# Patient Record
Sex: Male | Born: 1975 | Race: White | Hispanic: No | Marital: Married | Smoking: Current every day smoker
Health system: Southern US, Community
[De-identification: ages and names within clinical notes are randomized; demographics above are authoritative.]

## PROBLEM LIST (undated history)

## (undated) DIAGNOSIS — T8859XA Other complications of anesthesia, initial encounter: Secondary | ICD-10-CM

## (undated) DIAGNOSIS — Z720 Tobacco use: Secondary | ICD-10-CM

## (undated) DIAGNOSIS — T4145XA Adverse effect of unspecified anesthetic, initial encounter: Secondary | ICD-10-CM

## (undated) DIAGNOSIS — K219 Gastro-esophageal reflux disease without esophagitis: Secondary | ICD-10-CM

## (undated) DIAGNOSIS — E119 Type 2 diabetes mellitus without complications: Secondary | ICD-10-CM

## (undated) DIAGNOSIS — K635 Polyp of colon: Secondary | ICD-10-CM

## (undated) DIAGNOSIS — K2270 Barrett's esophagus without dysplasia: Secondary | ICD-10-CM

## (undated) DIAGNOSIS — Z8249 Family history of ischemic heart disease and other diseases of the circulatory system: Secondary | ICD-10-CM

## (undated) DIAGNOSIS — I10 Essential (primary) hypertension: Secondary | ICD-10-CM

## (undated) HISTORY — PX: HERNIA REPAIR: SHX51

## (undated) HISTORY — PX: MULTIPLE TOOTH EXTRACTIONS: SHX2053

## (undated) HISTORY — PX: OTHER SURGICAL HISTORY: SHX169

## (undated) HISTORY — DX: Barrett's esophagus without dysplasia: K22.70

## (undated) HISTORY — DX: Polyp of colon: K63.5

---

## 2003-01-28 ENCOUNTER — Emergency Department (HOSPITAL_COMMUNITY): Admission: EM | Admit: 2003-01-28 | Discharge: 2003-01-28 | Payer: Self-pay | Admitting: Emergency Medicine

## 2007-01-22 ENCOUNTER — Emergency Department (HOSPITAL_COMMUNITY): Admission: EM | Admit: 2007-01-22 | Discharge: 2007-01-22 | Payer: Self-pay | Admitting: Emergency Medicine

## 2007-04-12 ENCOUNTER — Emergency Department (HOSPITAL_COMMUNITY): Admission: EM | Admit: 2007-04-12 | Discharge: 2007-04-12 | Payer: Self-pay | Admitting: Emergency Medicine

## 2008-08-06 ENCOUNTER — Emergency Department (HOSPITAL_COMMUNITY): Admission: EM | Admit: 2008-08-06 | Discharge: 2008-08-06 | Payer: Self-pay | Admitting: Emergency Medicine

## 2008-10-24 ENCOUNTER — Emergency Department (HOSPITAL_COMMUNITY): Admission: EM | Admit: 2008-10-24 | Discharge: 2008-10-24 | Payer: Self-pay | Admitting: Emergency Medicine

## 2010-04-09 ENCOUNTER — Emergency Department (HOSPITAL_COMMUNITY): Admission: EM | Admit: 2010-04-09 | Discharge: 2010-04-10 | Payer: Self-pay | Admitting: Emergency Medicine

## 2010-10-18 LAB — URINE MICROSCOPIC-ADD ON

## 2010-10-18 LAB — URINALYSIS, ROUTINE W REFLEX MICROSCOPIC
Nitrite: NEGATIVE
Specific Gravity, Urine: 1.025 (ref 1.005–1.030)
Urobilinogen, UA: 0.2 mg/dL (ref 0.0–1.0)
pH: 6 (ref 5.0–8.0)

## 2011-05-22 LAB — WOUND CULTURE

## 2011-08-03 ENCOUNTER — Emergency Department (HOSPITAL_COMMUNITY)
Admission: EM | Admit: 2011-08-03 | Discharge: 2011-08-03 | Disposition: A | Discharge status: Home / Self Care | Payer: Self-pay | Attending: Emergency Medicine | Admitting: Emergency Medicine

## 2011-08-03 DIAGNOSIS — K089 Disorder of teeth and supporting structures, unspecified: Secondary | ICD-10-CM | POA: Insufficient documentation

## 2011-08-03 DIAGNOSIS — K0889 Other specified disorders of teeth and supporting structures: Secondary | ICD-10-CM

## 2011-08-03 DIAGNOSIS — F17200 Nicotine dependence, unspecified, uncomplicated: Secondary | ICD-10-CM | POA: Insufficient documentation

## 2011-08-03 DIAGNOSIS — K029 Dental caries, unspecified: Secondary | ICD-10-CM | POA: Insufficient documentation

## 2011-08-03 MED ORDER — PENICILLIN V POTASSIUM 250 MG PO TABS
500.0000 mg | ORAL_TABLET | Freq: Once | ORAL | Status: AC
  Administered 2011-08-03: 500 mg via ORAL
  Filled 2011-08-03: qty 2

## 2011-08-03 MED ORDER — IBUPROFEN 800 MG PO TABS
800.0000 mg | ORAL_TABLET | Freq: Once | ORAL | Status: AC
  Administered 2011-08-03: 800 mg via ORAL
  Filled 2011-08-03: qty 1

## 2011-08-03 MED ORDER — HYDROCODONE-ACETAMINOPHEN 5-325 MG PO TABS: 1.0000 | ORAL_TABLET | Freq: Four times a day (QID) | ORAL | Status: AC | PRN

## 2011-08-03 MED ORDER — PENICILLIN V POTASSIUM 500 MG PO TABS: 500.0000 mg | ORAL_TABLET | Freq: Four times a day (QID) | ORAL | Status: AC

## 2011-08-03 NOTE — ED Provider Notes (Signed)
Medical screening examination/treatment/procedure(s) were performed by non-physician practitioner and as supervising physician I was immediately available for consultation/collaboration.  Leyla Soliz, MD 08/03/11 1954 

## 2011-08-03 NOTE — ED Notes (Signed)
Pt presents with dental pain to right upper tooth. Pt states there is a blister present on the roof of mouth next to painful tooth. Pt states pain started 3 days ago.

## 2011-08-03 NOTE — ED Provider Notes (Signed)
History     CSN: [id number]  Arrival date & time 08/03/11  1208   None     Chief Complaint  Patient presents with  . Dental Pain    (Consider location/radiation/quality/duration/timing/severity/associated sxs/prior treatment) Patient is a 35 y.o. male presenting with tooth pain. The history is provided by the patient. No language interpreter was used.  Dental PainThe primary symptoms include mouth pain. Primary symptoms do not include dental injury. The symptoms began 2 days ago. The symptoms are unchanged. The symptoms occur constantly.    History reviewed. No pertinent past medical history.  Past Surgical History  Procedure Date  . Hernia repair     Family History  Problem Relation Age of Onset  . Diabetes Mother   . Hypertension Mother   . Heart failure Father   . Diabetes Brother   . Hypertension Brother     History  Substance Use Topics  . Smoking status: Current Everyday Smoker -- 2.0 packs/day  . Smokeless tobacco: Not on file  . Alcohol Use: No      Review of Systems  HENT: Positive for dental problem.   All other systems reviewed and are negative.    Allergies  Review of patient's allergies indicates no known allergies.  Home Medications  No current outpatient prescriptions on file.  BP 161/105  Pulse 132  Temp(Src) 98.3 F (36.8 C) (Oral)  Resp 18  Ht 5\' 9"  (1.753 m)  Wt 255 lb (115.667 kg)  BMI 37.66 kg/m2  SpO2 100%  Physical Exam  Nursing note and vitals reviewed. Constitutional: [name] oriented to person, place, and time. He appears well-developed and well-nourished.  HENT:  Head: Normocephalic and atraumatic.  Nose: Nose normal.  Mouth/Throat: Oropharynx is clear and moist and mucous membranes are normal. Abnormal dentition. Dental caries present. No dental abscesses or uvula swelling. No oropharyngeal exudate.    Eyes: EOM are normal.  Neck: Normal range of motion.  Cardiovascular: Normal rate, regular rhythm, normal heart  sounds and intact distal pulses.   Pulmonary/Chest: Effort normal and breath sounds normal. No respiratory distress.  Abdominal: Soft. He exhibits no distension. There is no tenderness.  Musculoskeletal: Normal range of motion.  Neurological: [name] alert and oriented to person, place, and time.  Skin: Skin is warm and dry.  Psychiatric: He has a normal mood and affect. Judgment normal.    ED Course  Procedures (including critical care time)  Labs Reviewed - No data to display No results found.   No diagnosis found.    MDM          Worthy Rancher, PA 08/03/11 [phone]

## 2011-12-18 ENCOUNTER — Emergency Department (HOSPITAL_COMMUNITY)
Admission: EM | Admit: 2011-12-18 | Discharge: 2011-12-18 | Disposition: A | Discharge status: Home / Self Care | Payer: Medicaid Other | Attending: Emergency Medicine | Admitting: Emergency Medicine

## 2011-12-18 ENCOUNTER — Encounter (HOSPITAL_COMMUNITY): Payer: Self-pay | Admitting: *Deleted

## 2011-12-18 ENCOUNTER — Emergency Department (HOSPITAL_COMMUNITY): Payer: Medicaid Other

## 2011-12-18 DIAGNOSIS — L02619 Cutaneous abscess of unspecified foot: Secondary | ICD-10-CM | POA: Insufficient documentation

## 2011-12-18 DIAGNOSIS — M7989 Other specified soft tissue disorders: Secondary | ICD-10-CM | POA: Insufficient documentation

## 2011-12-18 DIAGNOSIS — L03116 Cellulitis of left lower limb: Secondary | ICD-10-CM

## 2011-12-18 MED ORDER — HYDROCODONE-ACETAMINOPHEN 5-325 MG PO TABS: ORAL_TABLET | ORAL | Status: DC

## 2011-12-18 MED ORDER — DOXYCYCLINE HYCLATE 100 MG PO CAPS: 100.0000 mg | ORAL_CAPSULE | Freq: Two times a day (BID) | ORAL | Status: AC

## 2011-12-18 MED ORDER — LIDOCAINE HCL (PF) 1 % IJ SOLN
INTRAMUSCULAR | Status: AC
  Administered 2011-12-18: 2.1 mL
  Filled 2011-12-18: qty 5

## 2011-12-18 MED ORDER — CEFTRIAXONE SODIUM 1 G IJ SOLR
1.0000 g | Freq: Once | INTRAMUSCULAR | Status: AC
  Administered 2011-12-18: 1 g via INTRAMUSCULAR
  Filled 2011-12-18: qty 10

## 2011-12-18 MED ORDER — DOXYCYCLINE HYCLATE 100 MG PO TABS
100.0000 mg | ORAL_TABLET | Freq: Once | ORAL | Status: AC
  Administered 2011-12-18: 100 mg via ORAL
  Filled 2011-12-18: qty 1

## 2011-12-18 NOTE — ED Notes (Signed)
Pt c/o pain and swelling to his left foot x 2 days. Denies injury.

## 2011-12-18 NOTE — ED Provider Notes (Signed)
Medical screening examination/treatment/procedure(s) were performed by non-physician practitioner and as supervising physician I was immediately available for consultation/collaboration.   Dione Booze, MD 12/18/11 [phone]

## 2011-12-18 NOTE — ED Provider Notes (Signed)
History     CSN: [id number]  Arrival date & time 12/18/11  1715   First MD Initiated Contact with Patient 12/18/11 1806      Chief Complaint  Patient presents with  . Foot Pain    (Consider location/radiation/quality/duration/timing/severity/associated sxs/prior treatment) HPI Comments: Pt has noted gradually worsening swelling and pain to dorsum of L foot since yest.  No known injury.  No fever or chills.  No h/o DM.  Patient is a 36 y.o. male presenting with lower extremity pain. The history is provided by the patient. No language interpreter was used.  Foot Pain This is a new problem. The current episode started yesterday. The problem occurs constantly. The problem has been gradually worsening. Pertinent negatives include no fever. The symptoms are aggravated by walking and standing (palpation). He has tried nothing for the symptoms.    History reviewed. No pertinent past medical history.  Past Surgical History  Procedure Date  . Hernia repair     Family History  Problem Relation Age of Onset  . Diabetes Mother   . Hypertension Mother   . Heart failure Father   . Diabetes Brother   . Hypertension Brother     History  Substance Use Topics  . Smoking status: Current Everyday Smoker -- 2.0 packs/day    Types: Cigarettes  . Smokeless tobacco: Not on file  . Alcohol Use: No      Review of Systems  Constitutional: Negative for fever.  Musculoskeletal:       Foot pain  All other systems reviewed and are negative.    Allergies  Review of patient's allergies indicates no known allergies.  Home Medications   Current Outpatient Rx  Name Route Sig Dispense Refill  . ACETAMINOPHEN 500 MG PO TABS Oral Take 1,500-2,000 mg by mouth 2 (two) times daily as needed. For pain    . DOXYCYCLINE HYCLATE 100 MG PO CAPS Oral Take 1 capsule (100 mg total) by mouth 2 (two) times daily. 20 capsule 0    BP 149/99  Pulse 112  Temp(Src) 97.8 F (36.6 C) (Oral)  Resp 20  Ht  5\' 9"  (1.753 m)  Wt 273 lb 6.4 oz (124.013 kg)  BMI 40.37 kg/m2  SpO2 97%  Physical Exam  Nursing note and vitals reviewed. Constitutional: [name] oriented to person, place, and time. He appears well-developed and well-nourished.  HENT:  Head: Normocephalic and atraumatic.  Eyes: EOM are normal.  Neck: Normal range of motion.  Cardiovascular: Normal rate, regular rhythm, normal heart sounds and intact distal pulses.   Pulmonary/Chest: Effort normal and breath sounds normal. No respiratory distress.  Abdominal: Soft. He exhibits no distension. There is no tenderness.  Musculoskeletal: He exhibits tenderness.       Left foot: He exhibits decreased range of motion, tenderness and swelling. He exhibits no bony tenderness, normal capillary refill, no crepitus, no deformity and no laceration.       Feet:       + erythema, swelling and PT especially at junction of second and third toes.  No focal induration or fluctuance.  Severe athletes foot with break down of skin between toes.  prob secondary infection with cellulitis.  Neurological: [name] alert and oriented to person, place, and time.  Skin: Skin is warm and dry.  Psychiatric: He has a normal mood and affect. Judgment normal.    ED Course  Procedures (including critical care time)  Labs Reviewed - No data to display Dg Foot Complete Left  12/18/2011  *RADIOLOGY REPORT*  Clinical Data: Left foot pain with swelling and redness.  LEFT FOOT - COMPLETE 3+ VIEW  Comparison: None.  Findings: There is soft tissue swelling about the forefoot.  No underlying acute osseous abnormality.  Calcaneal spurs are noted.  IMPRESSION: Forefoot soft tissue swelling without acute osseous abnormality.  Original Report Authenticated By: Reyes Ivan, M.D.     1. Cellulitis of left foot       MDM  Suspect cellulitis.  Secondary infection with underlying athletes foot.  Told to return tomorrow if no noted improvement for ? IV abx.  Have given  rocephin 1 gm IM and started on doxycycline 100 mg BID.  Has no PCP.        Worthy Rancher, PA 12/18/11 1949

## 2011-12-18 NOTE — Discharge Instructions (Signed)
Cellulitis Cellulitis is an infection of the tissue under the skin. The infected area is usually red and tender. This is caused by germs. These germs enter the body through cuts or sores. This usually happens in the arms or lower legs. HOME CARE   Take your medicine as told. Finish it even if you start to feel better.   If the infection is on the arm or leg, keep it raised (elevated).   Use a warm cloth on the infected area several times a day.   See your doctor for a follow-up visit as told.  GET HELP RIGHT AWAY IF:   You are tired or confused.   You throw up (vomit).   You have watery poop (diarrhea).   You feel ill and have muscle aches.   You have a fever.  MAKE SURE YOU:   Understand these instructions.   Will watch your condition.   Will get help right away if you are not doing well or get worse.  Document Released: 01/08/2008 Document Revised: 07/11/2011 Document Reviewed: 06/23/2009 [employer] Patient Information 2012 [employer]. 

## 2011-12-24 ENCOUNTER — Emergency Department (HOSPITAL_COMMUNITY): Payer: Medicaid Other

## 2011-12-24 ENCOUNTER — Encounter (HOSPITAL_COMMUNITY): Payer: Self-pay

## 2011-12-24 ENCOUNTER — Inpatient Hospital Stay (HOSPITAL_COMMUNITY)
Admission: EM | Admit: 2011-12-24 | Discharge: 2011-12-26 | DRG: 603 | Disposition: A | Discharge status: Home / Self Care | Payer: Medicaid Other | Attending: Internal Medicine | Admitting: Internal Medicine

## 2011-12-24 DIAGNOSIS — Z720 Tobacco use: Secondary | ICD-10-CM

## 2011-12-24 DIAGNOSIS — L03119 Cellulitis of unspecified part of limb: Secondary | ICD-10-CM

## 2011-12-24 DIAGNOSIS — S91302A Unspecified open wound, left foot, initial encounter: Secondary | ICD-10-CM

## 2011-12-24 DIAGNOSIS — B353 Tinea pedis: Secondary | ICD-10-CM

## 2011-12-24 DIAGNOSIS — Z8249 Family history of ischemic heart disease and other diseases of the circulatory system: Secondary | ICD-10-CM

## 2011-12-24 DIAGNOSIS — L03116 Cellulitis of left lower limb: Secondary | ICD-10-CM

## 2011-12-24 DIAGNOSIS — F17200 Nicotine dependence, unspecified, uncomplicated: Secondary | ICD-10-CM | POA: Diagnosis present

## 2011-12-24 DIAGNOSIS — L02619 Cutaneous abscess of unspecified foot: Principal | ICD-10-CM | POA: Diagnosis present

## 2011-12-24 HISTORY — DX: Family history of ischemic heart disease and other diseases of the circulatory system: Z82.49

## 2011-12-24 HISTORY — DX: Tobacco use: Z72.0

## 2011-12-24 LAB — BASIC METABOLIC PANEL
BUN: 17 mg/dL (ref 6–23)
CO2: 26 mEq/L (ref 19–32)
Chloride: 99 mEq/L (ref 96–112)
GFR calc non Af Amer: >90 mL/min (ref >90)
Glucose, Bld: 112 mg/dL — ABNORMAL HIGH (ref 70–99)
Potassium: 4.7 mEq/L (ref 3.5–5.1)
Sodium: 135 mEq/L (ref 135–145)

## 2011-12-24 LAB — CBC
Hemoglobin: 17 g/dL (ref 13.0–17.0)
MCH: 30 pg (ref 26.0–34.0)
Platelets: 263 10*3/uL (ref 150–400)
RBC: 5.67 MIL/uL (ref 4.22–5.81)
WBC: 11.9 10*3/uL — ABNORMAL HIGH (ref 4.0–10.5)

## 2011-12-24 LAB — DIFFERENTIAL
Eosinophils Absolute: 0.2 10*3/uL (ref 0.0–0.7)
Lymphocytes Relative: 21 % (ref 12–46)
Lymphs Abs: 2.5 10*3/uL (ref 0.7–4.0)
Monocytes Relative: 7 % (ref 3–12)
Neutro Abs: 8.5 10*3/uL — ABNORMAL HIGH (ref 1.7–7.7)
Neutrophils Relative %: 71 % (ref 43–77)

## 2011-12-24 MED ORDER — VANCOMYCIN HCL 1000 MG IV SOLR
1250.0000 mg | Freq: Two times a day (BID) | INTRAVENOUS | Status: DC
  Administered 2011-12-24 – 2011-12-26 (×4): 1250 mg via INTRAVENOUS
  Filled 2011-12-24 (×4): qty 1250

## 2011-12-24 MED ORDER — ALUM & MAG HYDROXIDE-SIMETH 200-200-20 MG/5ML PO SUSP: 30.0000 mL | Freq: Four times a day (QID) | ORAL | Status: DC | PRN

## 2011-12-24 MED ORDER — SODIUM CHLORIDE 0.9 % IJ SOLN: 3.0000 mL | INTRAMUSCULAR | Status: DC | PRN

## 2011-12-24 MED ORDER — ACETAMINOPHEN 325 MG PO TABS: 650.0000 mg | ORAL_TABLET | Freq: Four times a day (QID) | ORAL | Status: DC | PRN

## 2011-12-24 MED ORDER — ONDANSETRON HCL 4 MG/2ML IJ SOLN: 4.0000 mg | Freq: Four times a day (QID) | INTRAMUSCULAR | Status: DC | PRN

## 2011-12-24 MED ORDER — ACETAMINOPHEN 650 MG RE SUPP: 650.0000 mg | Freq: Four times a day (QID) | RECTAL | Status: DC | PRN

## 2011-12-24 MED ORDER — SODIUM CHLORIDE 0.9 % IJ SOLN
INTRAMUSCULAR | Status: AC
  Filled 2011-12-24: qty 3

## 2011-12-24 MED ORDER — TERBINAFINE HCL 1 % EX CREA
1.0000 "application " | TOPICAL_CREAM | Freq: Two times a day (BID) | CUTANEOUS | Status: DC
  Administered 2011-12-24 – 2011-12-26 (×4): 1 via TOPICAL
  Filled 2011-12-24: qty 12

## 2011-12-24 MED ORDER — SODIUM CHLORIDE 0.9 % IV SOLN
Freq: Once | INTRAVENOUS | Status: AC
  Administered 2011-12-24: 1000 mL via INTRAVENOUS

## 2011-12-24 MED ORDER — SODIUM CHLORIDE 0.9 % IJ SOLN
3.0000 mL | Freq: Two times a day (BID) | INTRAMUSCULAR | Status: DC
  Administered 2011-12-24 – 2011-12-26 (×5): 3 mL via INTRAVENOUS
  Filled 2011-12-24 (×5): qty 3

## 2011-12-24 MED ORDER — ONDANSETRON HCL 4 MG PO TABS: 4.0000 mg | ORAL_TABLET | Freq: Four times a day (QID) | ORAL | Status: DC | PRN

## 2011-12-24 MED ORDER — VANCOMYCIN HCL IN DEXTROSE 1-5 GM/200ML-% IV SOLN
1000.0000 mg | Freq: Once | INTRAVENOUS | Status: AC
  Administered 2011-12-24: 1000 mg via INTRAVENOUS
  Filled 2011-12-24: qty 200

## 2011-12-24 MED ORDER — ONDANSETRON HCL 4 MG/2ML IJ SOLN
4.0000 mg | Freq: Once | INTRAMUSCULAR | Status: AC
  Administered 2011-12-24: 4 mg via INTRAVENOUS
  Filled 2011-12-24: qty 2

## 2011-12-24 MED ORDER — ALBUTEROL SULFATE HFA 108 (90 BASE) MCG/ACT IN AERS
2.0000 | INHALATION_SPRAY | Freq: Two times a day (BID) | RESPIRATORY_TRACT | Status: DC
  Administered 2011-12-24 – 2011-12-26 (×4): 2 via RESPIRATORY_TRACT
  Filled 2011-12-24: qty 6.7

## 2011-12-24 MED ORDER — DOXYCYCLINE HYCLATE 100 MG PO TABS
100.0000 mg | ORAL_TABLET | Freq: Two times a day (BID) | ORAL | Status: DC
  Administered 2011-12-24 – 2011-12-25 (×3): 100 mg via ORAL
  Filled 2011-12-24 (×3): qty 1

## 2011-12-24 MED ORDER — NICOTINE 21 MG/24HR TD PT24
21.0000 mg | MEDICATED_PATCH | Freq: Every day | TRANSDERMAL | Status: DC
  Administered 2011-12-24 – 2011-12-26 (×3): 21 mg via TRANSDERMAL
  Filled 2011-12-24 (×4): qty 1

## 2011-12-24 MED ORDER — HYDROCODONE-ACETAMINOPHEN 5-325 MG PO TABS
1.0000 | ORAL_TABLET | ORAL | Status: DC | PRN
  Administered 2011-12-24 – 2011-12-26 (×10): 1 via ORAL
  Filled 2011-12-24 (×10): qty 1

## 2011-12-24 MED ORDER — SODIUM CHLORIDE 0.9 % IV SOLN: 250.0000 mL | INTRAVENOUS | Status: DC | PRN

## 2011-12-24 MED ORDER — HYDROMORPHONE HCL PF 1 MG/ML IJ SOLN
1.0000 mg | Freq: Once | INTRAMUSCULAR | Status: AC
  Administered 2011-12-24: 1 mg via INTRAVENOUS
  Filled 2011-12-24: qty 1

## 2011-12-24 MED ORDER — HYDROMORPHONE HCL PF 1 MG/ML IJ SOLN: 0.5000 mg | INTRAMUSCULAR | Status: DC | PRN

## 2011-12-24 NOTE — ED Notes (Signed)
Left foot swollen and red for 1 week, was seen in er 2 days ago and told to return if no better, area more swollen and now draining

## 2011-12-24 NOTE — H&P (Signed)
Douglas Dorsey MRN: [id number] DOB/AGE: 10-Apr-1976 36 y.o. Primary Care Physician:No primary provider on file. Admit date: 12/24/2011 Chief Complaint: Left foot pain, swelling, and redness. HPI: The patient is a 36 year old man with no significant past medical history, who presents to the emergency department today for a recheck of his left foot. The patient originally presented to the emergency department on 12/18/2011 for pain, redness, and swelling of his left foot. The pain and redness started spontaneously. It became swollen. He denies trauma. He denies recent new shoes. He does wear socks with his shoes. He acknowledges that he has athletes feet. He was given Rocephin and discharged home on doxycycline from the emergency department on 12/18/2011.Marland Kitchen He says that he has been taking doxycycline as prescribed. However, the redness, swelling, and pain have not subsided over the past several days following the initiation of doxycycline. There has been no red streakiness radiating to his left leg. He has some calf pain. He now has some drainage from the toes on his left foot, but it is not a pustular drainage. He denies fever, chills, nausea, vomiting, or diarrhea. He denies any history of diabetes, but his mother has diabetes.  In the emergency department, the patient is noted to be afebrile and hemodynamically stable. His white blood cell count is marginally elevated at 11.9. The x-ray of his left  forefoot reveals soft tissue swelling, but no definite acute bony abnormalities. [name] being admitted for further evaluation and management.  Past Medical History  Diagnosis Date  . Tobacco abuse   . Family history of coronary artery disease 12/24/2011    Past Surgical History  Procedure Date  . Hernia repair     Umbilical    Prior to Admission medications   Medication Sig Start Date End Date Taking? Authorizing Provider  doxycycline (VIBRAMYCIN) 100 MG capsule Take 1 capsule (100 mg total) by mouth 2  (two) times daily. 12/18/11 12/28/11 Yes Richard Nicholi Half, PA  HYDROcodone-acetaminophen [facility]) 5-325 MG per tablet One tab po q 4-6 hrs prn pain 12/18/11  Yes Worthy Rancher, PA    Allergies: No Known Allergies  Family History  Problem Relation Age of Onset  . Diabetes Mother   . Hypertension Mother   . Heart failure Father   . Diabetes Brother   . Hypertension Brother    Family history continued: His mother has diabetes mellitus and status post CABG. His father died of a myocardial infarction.  Social History: The patient is married. He lives with his wife and children in [place]. He was laid off from his job. His unemployment benefits just ran out. His wife is unemployed. He smokes 2 packs of cigarettes daily. He denies alcohol and illicit drug use.           ROS: As above in history present illness, otherwise negative.  PHYSICAL EXAM: Blood pressure 129/86, pulse 95, temperature 98 F (36.7 C), temperature source Oral, resp. rate 22, height 5\' 9"  (1.753 m), weight 123.832 kg (273 lb), SpO2 95.00%.  General: 36 year old Caucasian man, sitting up in bed, in no acute distress. HEENT: Head is normocephalic, nontraumatic. Pupils are equal, round, and reactive to light. Extraocular movements are intact. Conjunctivae are clear. Oropharynx reveals moist mucous membranes. No posterior exudates or erythema. Neck: Supple, no adenopathy, no thyromegaly. Lungs: Occasional rhonchus wheezes. Breathing nonlabored. Heart: S1, S2, with no murmurs rubs or gallops. Abdomen: Obese, positive bowel sounds, soft, nontender, nondistended. Extremities: The left foot is edematous, nonpitting 1+ edema. The  dorsum of his left foot is moderately erythematous from his toes to just above his ankle. There is superficial ulcerations/excoriations over several toes of his left foot, with mild serosanguineous drainage, non-pustular/non-purulent. There is interdigital scaliness and erythema. There is scaliness  on the plantar surfaces of both feet. No calf tenderness. No popliteal tenderness or lymph nodes palpated. Pedal pulses palpable bilaterally. Neurologic/psychiatric: [name] alert and oriented x3. His affect is pleasant. Radial nerves II through XII are intact. Strength is 5 over 5 throughout except the left foot which was not tested for strength due to the infection.. Sensation is intact.  Basic Metabolic Panel:  Basename 12/24/11 1040  NA 135  K 4.7  CL 99  CO2 26  GLUCOSE 112*  BUN 17  CREATININE 0.98  CALCIUM 10.4  MG --  PHOS --   Liver Function Tests: No results found for this basename: AST:2,ALT:2,ALKPHOS:2,BILITOT:2,PROT:2,ALBUMIN:2 in the last 72 hours No results found for this basename: LIPASE:2,AMYLASE:2 in the last 72 hours No results found for this basename: AMMONIA:2 in the last 72 hours CBC:  Basename 12/24/11 1040  WBC 11.9*  NEUTROABS 8.5*  HGB 17.0  HCT 48.9  MCV 86.2  PLT 263   Cardiac Enzymes: No results found for this basename: CKTOTAL:3,CKMB:3,CKMBINDEX:3,TROPONINI:3 in the last 72 hours BNP: No results found for this basename: PROBNP:3 in the last 72 hours D-Dimer: No results found for this basename: DDIMER:2 in the last 72 hours CBG: No results found for this basename: GLUCAP:6 in the last 72 hours Hemoglobin A1C: No results found for this basename: HGBA1C in the last 72 hours Fasting Lipid Panel: No results found for this basename: CHOL,HDL,LDLCALC,TRIG,CHOLHDL,LDLDIRECT in the last 72 hours Thyroid Function Tests: No results found for this basename: TSH,T4TOTAL,FREET4,T3FREE,THYROIDAB in the last 72 hours Anemia Panel: No results found for this basename: VITAMINB12,FOLATE,FERRITIN,TIBC,IRON,RETICCTPCT in the last 72 hours Coagulation: No results found for this basename: LABPROT:2,INR:2 in the last 72 hours Urine Drug Screen: Drugs of Abuse  No results found for this basename: labopia,  cocainscrnur,  labbenz,  amphetmu,  thcu,  labbarb      Alcohol Level: No results found for this basename: ETH:2 in the last 72 hours Urinalysis: No results found for this basename: COLORURINE:2,APPERANCEUR:2,LABSPEC:2,PHURINE:2,GLUCOSEU:2,HGBUR:2,BILIRUBINUR:2,KETONESUR:2,PROTEINUR:2,UROBILINOGEN:2,NITRITE:2,LEUKOCYTESUR:2 in the last 72 hours Misc. Labs:     No results found for this or any previous visit (from the past 240 hour(s)).   Results for orders placed during the hospital encounter of 12/24/11 (from the past 48 hour(s))  CBC     Status: Abnormal   Collection Time   12/24/11 10:40 AM      Component Value Range Comment   WBC 11.9 (*) 4.0 - 10.5 (K/uL)    RBC 5.67  4.22 - 5.81 (MIL/uL)    Hemoglobin 17.0  13.0 - 17.0 (g/dL)    HCT 45.4  09.8 - 11.9 (%)    MCV 86.2  78.0 - 100.0 (fL)    MCH 30.0  26.0 - 34.0 (pg)    MCHC 34.8  30.0 - 36.0 (g/dL)    RDW 14.7  82.9 - 56.2 (%)    Platelets 263  150 - 400 (K/uL)   DIFFERENTIAL     Status: Abnormal   Collection Time   12/24/11 10:40 AM      Component Value Range Comment   Neutrophils Relative 71  43 - 77 (%)    Neutro Abs 8.5 (*) 1.7 - 7.7 (K/uL)    Lymphocytes Relative 21  12 - 46 (%)  Lymphs Abs 2.5  0.7 - 4.0 (K/uL)    Monocytes Relative 7  3 - 12 (%)    Monocytes Absolute 0.8  0.1 - 1.0 (K/uL)    Eosinophils Relative 1  0 - 5 (%)    Eosinophils Absolute 0.2  0.0 - 0.7 (K/uL)    Basophils Relative 0  0 - 1 (%)    Basophils Absolute 0.0  0.0 - 0.1 (K/uL)   BASIC METABOLIC PANEL     Status: Abnormal   Collection Time   12/24/11 10:40 AM      Component Value Range Comment   Sodium 135  135 - 145 (mEq/L)    Potassium 4.7  3.5 - 5.1 (mEq/L)    Chloride 99  96 - 112 (mEq/L)    CO2 26  19 - 32 (mEq/L)    Glucose, Bld 112 (*) 70 - 99 (mg/dL)    BUN 17  6 - 23 (mg/dL)    Creatinine, Ser 4.54  0.50 - 1.35 (mg/dL)    Calcium 09.8  8.4 - 10.5 (mg/dL)    GFR calc non Af Amer >90  >90 (mL/min)    GFR calc Af Amer >90  >90 (mL/min)     Dg Foot Complete Left  12/24/2011   *RADIOLOGY REPORT*  Clinical Data: Left foot pain, open sore at second toe, bleeding, question stepped on a child's toy  LEFT FOOT - COMPLETE 3+ VIEW  Comparison: 12/18/2011  Findings: Soft tissue swelling at the forefoot and toes. Osseous mineralization normal. Joint spaces preserved. No acute fracture, dislocation, or bone destruction. Tiny plantar and Achilles insertion calcaneal spurs.  IMPRESSION: Soft tissue swelling at forefoot. No definite acute bony abnormalities. If there is continued clinical concern for occult osteomyelitis or soft tissue abscess of the foot, consider MR imaging with/without contrast.  Original Report Authenticated By: Lollie Marrow, M.D.   Dg Foot Complete Left  12/18/2011  *RADIOLOGY REPORT*  Clinical Data: Left foot pain with swelling and redness.  LEFT FOOT - COMPLETE 3+ VIEW  Comparison: None.  Findings: There is soft tissue swelling about the forefoot.  No underlying acute osseous abnormality.  Calcaneal spurs are noted.  IMPRESSION: Forefoot soft tissue swelling without acute osseous abnormality.  Original Report Authenticated By: Reyes Ivan, M.D.    Impression:  Active Problems:  Cellulitis of foot  Tinea pedis  Tobacco abuse  Family history of coronary artery disease   36 year old with cellulitis of the left foot and concomitant tinea pedis. It is likely that his tinea pedis lead to a superimposed bacterial infection. He has failed outpatient therapy, but he does not appear to be toxic and it is less likely that he has a deeper infection such as myositis or osteomyelitis. He was strongly advised to stop smoking given his strong family history of coronary artery disease.  Plan:  1. The patient was given IV vancomycin in the emergency department. We'll continue IV vancomycin and oral doxycycline. 2. We'll add Lamisil cream twice a day to his left foot. 3. Tobacco cessation counseling. 4. We'll place a nicotine patch daily. 5. Supportive  therapy.      Finian Helvey 12/24/2011, 1:51 PM

## 2011-12-24 NOTE — Consult Note (Signed)
ANTIBIOTIC CONSULT NOTE - INITIAL  Pharmacy Consult for Vancomycin Indication: cellulitis (foot)  No Known Allergies  Patient Measurements: Height: 5\' 9"  (175.3 cm) Weight: 273 lb (123.832 kg) IBW/kg (Calculated) : 70.7   Vital Signs: Temp: 98 F (36.7 C) (05/21 1021) Temp src: Oral (05/21 1021) BP: 129/86 mmHg (05/21 1241) Pulse Rate: 95  (05/21 1241) Intake/Output from previous day:   Intake/Output from this shift:    Labs:  Basename 12/24/11 1040  WBC 11.9*  HGB 17.0  PLT 263  LABCREA --  CREATININE 0.98   Estimated Creatinine Clearance: 139.4 ml/min (by C-G formula based on Cr of 0.98). No results found for this basename: VANCOTROUGH:2,VANCOPEAK:2,VANCORANDOM:2,GENTTROUGH:2,GENTPEAK:2,GENTRANDOM:2,TOBRATROUGH:2,TOBRAPEAK:2,TOBRARND:2,AMIKACINPEAK:2,AMIKACINTROU:2,AMIKACIN:2, in the last 72 hours   Microbiology: No results found for this or any previous visit (from the past 720 hour(s)).  Medical History: Past Medical History  Diagnosis Date  . Tobacco abuse    Medications:  Scheduled:    . sodium chloride   Intravenous Once  . albuterol  2 puff Inhalation BID  . doxycycline  100 mg Oral BID  .  HYDROmorphone (DILAUDID) injection  1 mg Intravenous Once  . nicotine  21 mg Transdermal Daily  . ondansetron  4 mg Intravenous Once  . sodium chloride  3 mL Intravenous Q12H  . terbinafine   Topical BID  . vancomycin  1,250 mg Intravenous Q12H   Assessment: 36yo Male with wound on foot that has not healed despite doxycycline OP Rx.  Returned to ER for further eval. Obesity, good renal fxn  Goal of Therapy:  Vancomycin trough level 10-15 mcg/ml  Plan: Vancomycin 1250mg  iv q12hrs Check trough at steady  Labs per protocol  Valrie Hart A 12/24/2011,1:31 PM

## 2011-12-24 NOTE — ED Provider Notes (Signed)
History   This chart was scribed for Joya Gaskins, MD by Clarita Crane. The patient was seen in room APA05/APA05. Patient's care was started at 1009.    CSN: [id number]  Arrival date & time 12/24/11  1009   First MD Initiated Contact with Patient 12/24/11 1023      Chief Complaint  Patient presents with  . Wound Check    HPI Douglas Dorsey is a 36 y.o. male who presents to the Emergency Department for recheck of wound to left foot. Patient reports he was initially evaluated in ED for wound and associated swelling to left foot 2 days ago and was told to return to ED if wound and swelling were not improving. Patient reports that area surrounding wound on left foot is with increased swelling and redness today with new onset of drainage from wound as well. Denies fever, vomiting, abdominal pain, chest pain, back pain. No trauma reported   PMH - none  Past Surgical History  Procedure Date  . Hernia repair     Family History  Problem Relation Age of Onset  . Diabetes Mother   . Hypertension Mother   . Heart failure Father   . Diabetes Brother   . Hypertension Brother     History  Substance Use Topics  . Smoking status: Current Everyday Smoker -- 2.0 packs/day    Types: Cigarettes  . Smokeless tobacco: Not on file  . Alcohol Use: No      Review of Systems A complete 10 system review of systems was obtained and all systems are negative except as noted in the HPI and PMH.   Allergies  Review of patient's allergies indicates no known allergies.  Home Medications   Current Outpatient Rx  Name Route Sig Dispense Refill  . DOXYCYCLINE HYCLATE 100 MG PO CAPS Oral Take 1 capsule (100 mg total) by mouth 2 (two) times daily. 20 capsule 0  . HYDROCODONE-ACETAMINOPHEN 5-325 MG PO TABS  One tab po q 4-6 hrs prn pain 20 tablet 0    BP 129/89  Pulse 115  Temp(Src) 98 F (36.7 C) (Oral)  Resp 20  Ht 5\' 9"  (1.753 m)  Wt 273 lb (123.832 kg)  BMI 40.32 kg/m2  SpO2  100%  Physical Exam CONSTITUTIONAL: Well developed/well nourished HEAD AND FACE: Normocephalic/atraumatic EYES: EOMI/PERRL ENMT: Mucous membranes moist NECK: supple no meningeal signs SPINE:entire spine nontender CV: S1/S2 noted, no murmurs/rubs/gallops noted LUNGS: Lungs are clear to auscultation bilaterally, no apparent distress ABDOMEN: soft, nontender, no rebound or guarding GU:no cva tenderness NEURO: Pt is awake/alert, moves all extremitiesx4, distal sensation intact within left foot EXTREMITIES: pulses normal, full ROM, no calf tenderness noted, Redness and tenderness to distal aspect of left foot, swelling to toes of left foot, wound to distal aspect of left foot is macerated and bleeding no pus noted SKIN: warm, color normal PSYCH: no abnormalities of mood noted  ED Course  Procedures   DIAGNOSTIC STUDIES: Oxygen Saturation is 100% on room air, normal by my interpretation.    COORDINATION OF CARE: Pt with worsening cellulitis to foot despite oral meds 12:00PM- Patient informed of intent to admit and agrees with plan.  12:27PM- consult complete with hospitalist. Patient case explained and discussed. Hospitalist agrees to admit patient for further evaluation and treatment.   Will defer further imaging as he will require IV meds inpatient as he is failing outpatient therapy  Results for orders placed during the hospital encounter of 12/24/11  CBC  Component Value Range   WBC 11.9 (*) 4.0 - 10.5 (K/uL)   RBC 5.67  4.22 - 5.81 (MIL/uL)   Hemoglobin 17.0  13.0 - 17.0 (g/dL)   HCT 16.1  09.6 - 04.5 (%)   MCV 86.2  78.0 - 100.0 (fL)   MCH 30.0  26.0 - 34.0 (pg)   MCHC 34.8  30.0 - 36.0 (g/dL)   RDW 40.9  81.1 - 91.4 (%)   Platelets 263  150 - 400 (K/uL)  DIFFERENTIAL      Component Value Range   Neutrophils Relative 71  43 - 77 (%)   Neutro Abs 8.5 (*) 1.7 - 7.7 (K/uL)   Lymphocytes Relative 21  12 - 46 (%)   Lymphs Abs 2.5  0.7 - 4.0 (K/uL)   Monocytes Relative 7   3 - 12 (%)   Monocytes Absolute 0.8  0.1 - 1.0 (K/uL)   Eosinophils Relative 1  0 - 5 (%)   Eosinophils Absolute 0.2  0.0 - 0.7 (K/uL)   Basophils Relative 0  0 - 1 (%)   Basophils Absolute 0.0  0.0 - 0.1 (K/uL)  BASIC METABOLIC PANEL      Component Value Range   Sodium 135  135 - 145 (mEq/L)   Potassium 4.7  3.5 - 5.1 (mEq/L)   Chloride 99  96 - 112 (mEq/L)   CO2 26  19 - 32 (mEq/L)   Glucose, Bld 112 (*) 70 - 99 (mg/dL)   BUN 17  6 - 23 (mg/dL)   Creatinine, Ser 7.82  0.50 - 1.35 (mg/dL)   Calcium 95.6  8.4 - 10.5 (mg/dL)   GFR calc non Af Amer >90  >90 (mL/min)   GFR calc Af Amer >90  >90 (mL/min)   ' Dg Foot Complete Left  12/24/2011  *RADIOLOGY REPORT*  Clinical Data: Left foot pain, open sore at second toe, bleeding, question stepped on a child's toy  LEFT FOOT - COMPLETE 3+ VIEW  Comparison: 12/18/2011  Findings: Soft tissue swelling at the forefoot and toes. Osseous mineralization normal. Joint spaces preserved. No acute fracture, dislocation, or bone destruction. Tiny plantar and Achilles insertion calcaneal spurs.  IMPRESSION: Soft tissue swelling at forefoot. No definite acute bony abnormalities. If there is continued clinical concern for occult osteomyelitis or soft tissue abscess of the foot, consider MR imaging with/without contrast.  Original Report Authenticated By: Lollie Marrow, M.D.       MDM  Nursing notes reviewed and considered in documentation Previous records reviewed and considered xrays reviewed and considered All labs/vitals reviewed and considered       I personally performed the services described in this documentation, which was scribed in my presence. The recorded information has been reviewed and considered.      Joya Gaskins, MD 12/24/11 1230

## 2011-12-24 NOTE — ED Notes (Signed)
Report called to floor. Pt VSS. Pt ready for transfer to floor.

## 2011-12-24 NOTE — ED Notes (Signed)
Pt reports was here approx  5days ago with sore to base of 2nd great toe.  Was given injection of rocephin and started doxycycline.  Has been taking antibiotics as directed but foot not getting better.  Dorsal aspect of foot red and swollen, toes red and swollen.  Skin cracked between 2nd and 3 rd toe and oozing blood.  Foot very tender.  Pt can move toes.

## 2011-12-25 DIAGNOSIS — L03119 Cellulitis of unspecified part of limb: Secondary | ICD-10-CM

## 2011-12-25 DIAGNOSIS — L02619 Cutaneous abscess of unspecified foot: Secondary | ICD-10-CM

## 2011-12-25 DIAGNOSIS — B353 Tinea pedis: Secondary | ICD-10-CM

## 2011-12-25 DIAGNOSIS — F17200 Nicotine dependence, unspecified, uncomplicated: Secondary | ICD-10-CM

## 2011-12-25 LAB — BASIC METABOLIC PANEL
BUN: 18 mg/dL (ref 6–23)
CO2: 23 mEq/L (ref 19–32)
Calcium: 9.7 mg/dL (ref 8.4–10.5)
Creatinine, Ser: 0.86 mg/dL (ref 0.50–1.35)
GFR calc non Af Amer: >90 mL/min (ref >90)
Glucose, Bld: 127 mg/dL — ABNORMAL HIGH (ref 70–99)
Sodium: 139 mEq/L (ref 135–145)

## 2011-12-25 LAB — CBC
MCH: 28.8 pg (ref 26.0–34.0)
MCHC: 32.8 g/dL (ref 30.0–36.0)
MCV: 87.6 fL (ref 78.0–100.0)
Platelets: 250 10*3/uL (ref 150–400)
RDW: 14.3 % (ref 11.5–15.5)

## 2011-12-25 MED ORDER — PIPERACILLIN-TAZOBACTAM 3.375 G IVPB
INTRAVENOUS | Status: AC
  Filled 2011-12-25: qty 100

## 2011-12-25 MED ORDER — HEPARIN SODIUM (PORCINE) 5000 UNIT/ML IJ SOLN
5000.0000 [IU] | Freq: Three times a day (TID) | INTRAMUSCULAR | Status: DC
  Administered 2011-12-25 – 2011-12-26 (×4): 5000 [IU] via SUBCUTANEOUS
  Filled 2011-12-25 (×4): qty 1

## 2011-12-25 MED ORDER — PIPERACILLIN-TAZOBACTAM 3.375 G IVPB
3.3750 g | Freq: Three times a day (TID) | INTRAVENOUS | Status: DC
  Administered 2011-12-25 – 2011-12-26 (×3): 3.375 g via INTRAVENOUS
  Filled 2011-12-25 (×7): qty 50

## 2011-12-25 NOTE — Consult Note (Signed)
ANTIBIOTIC CONSULT NOTE   Pharmacy Consult for Vancomycin Indication: cellulitis (foot)  No Known Allergies  Patient Measurements: Height: 5\' 9"  (175.3 cm) Weight: 273 lb (123.832 kg) IBW/kg (Calculated) : 70.7   Vital Signs: Temp: 97.8 F (36.6 C) (05/22 0531) Temp src: Oral (05/22 0531) BP: 149/105 mmHg (05/22 0531) Pulse Rate: 94  (05/22 0531) Intake/Output from previous day:   Intake/Output from this shift:    Labs:  Basename 12/25/11 0519 12/24/11 1040  WBC 8.1 11.9*  HGB 15.6 17.0  PLT 250 263  LABCREA -- --  CREATININE 0.86 0.98   Estimated Creatinine Clearance: 158.8 ml/min (by C-G formula based on Cr of 0.86). No results found for this basename: VANCOTROUGH:2,VANCOPEAK:2,VANCORANDOM:2,GENTTROUGH:2,GENTPEAK:2,GENTRANDOM:2,TOBRATROUGH:2,TOBRAPEAK:2,TOBRARND:2,AMIKACINPEAK:2,AMIKACINTROU:2,AMIKACIN:2, in the last 72 hours   Microbiology: No results found for this or any previous visit (from the past 720 hour(s)).  Medical History: Past Medical History  Diagnosis Date  . Tobacco abuse   . Family history of coronary artery disease 12/24/2011   Medications:  Scheduled:     . albuterol  2 puff Inhalation BID  . doxycycline  100 mg Oral BID  . nicotine  21 mg Transdermal Daily  . sodium chloride  3 mL Intravenous Q12H  . sodium chloride      . terbinafine  1 application Topical BID  . vancomycin  1,250 mg Intravenous Q12H  . vancomycin  1,000 mg Intravenous Once   Assessment: 35yo Male with wound on foot that has not healed despite doxycycline OP Rx.  Returned to ER for further eval. Obesity, good renal fxn  Goal of Therapy:  Vancomycin trough level 10-15 mcg/ml  Plan: Vancomycin 1250mg  iv q12hrs Check trough tomorrow Labs per protocol  Valrie Hart A 12/25/2011,11:49 AM

## 2011-12-25 NOTE — Progress Notes (Signed)
UR Chart Review Completed  

## 2011-12-25 NOTE — Progress Notes (Signed)
Subjective: Pain continues. Redness on his foot slightly improved.  Objective: Vital signs in last 24 hours: Filed Vitals:   12/24/11 2214 12/25/11 0531 12/25/11 0816 12/25/11 1351  BP: 129/91 149/105  138/96  Pulse: 106 94  100  Temp: 98.1 F (36.7 C) 97.8 F (36.6 C)  98 F (36.7 C)  TempSrc: Oral Oral  Oral  Resp: 20 20  20   Height:      Weight:      SpO2: 93% 95% 95% 93%   Weight change:  No intake or output data in the 24 hours ending 12/25/11 1638  General: Nontoxic. Comfortable watching TV. Lungs clear auscultation bilaterally without wheeze rhonchi or rales Cardiovascular regular rate rhythm without murmurs gallops rubs Abdomen soft nontender nondistended Extremities left foot with erythema involving the second through fourth toes and about 2 inches proximal to the metatarsophalangeal area. Dried blood present on the toes without active drainage. No fluctuant areas. Malodorous. Macerated areas between the toes particularly between second and third. Left foot is with severely overgrown thick toenails.  Lab Results: Basic Metabolic Panel:  Lab 12/25/11 2956 12/24/11 1040  NA 139 135  K 4.4 4.7  CL 102 99  CO2 23 26  GLUCOSE 127* 112*  BUN 18 17  CREATININE 0.86 0.98  CALCIUM 9.7 10.4  MG -- --  PHOS -- --   Liver Function Tests: No results found for this basename: AST:2,ALT:2,ALKPHOS:2,BILITOT:2,PROT:2,ALBUMIN:2 in the last 168 hours No results found for this basename: LIPASE:2,AMYLASE:2 in the last 168 hours No results found for this basename: AMMONIA:2 in the last 168 hours CBC:  Lab 12/25/11 0519 12/24/11 1040  WBC 8.1 11.9*  NEUTROABS -- 8.5*  HGB 15.6 17.0  HCT 47.5 48.9  MCV 87.6 86.2  PLT 250 263   Cardiac Enzymes: No results found for this basename: CKTOTAL:3,CKMB:3,CKMBINDEX:3,TROPONINI:3 in the last 168 hours BNP: No results found for this basename: PROBNP:3 in the last 168 hours D-Dimer: No results found for this basename: DDIMER:2 in the  last 168 hours CBG: No results found for this basename: GLUCAP:6 in the last 168 hours Hemoglobin A1C:  Lab 12/24/11 1040  HGBA1C 6.2*   Micro Results: No results found for this or any previous visit (from the past 240 hour(s)). Studies/Results: Dg Foot Complete Left  12/24/2011  *RADIOLOGY REPORT*  Clinical Data: Left foot pain, open sore at second toe, bleeding, question stepped on a child's toy  LEFT FOOT - COMPLETE 3+ VIEW  Comparison: 12/18/2011  Findings: Soft tissue swelling at the forefoot and toes. Osseous mineralization normal. Joint spaces preserved. No acute fracture, dislocation, or bone destruction. Tiny plantar and Achilles insertion calcaneal spurs.  IMPRESSION: Soft tissue swelling at forefoot. No definite acute bony abnormalities. If there is continued clinical concern for occult osteomyelitis or soft tissue abscess of the foot, consider MR imaging with/without contrast.  Original Report Authenticated By: Lollie Marrow, M.D.   Scheduled Meds:   . albuterol  2 puff Inhalation BID  . heparin subcutaneous  5,000 Units Subcutaneous Q8H  . nicotine  21 mg Transdermal Daily  . piperacillin-tazobactam (ZOSYN)  IV  3.375 g Intravenous Q8H  . sodium chloride  3 mL Intravenous Q12H  . sodium chloride      . terbinafine  1 application Topical BID  . vancomycin  1,250 mg Intravenous Q12H  . DISCONTD: doxycycline  100 mg Oral BID   Continuous Infusions:  PRN Meds:.sodium chloride, acetaminophen, acetaminophen, alum & mag hydroxide-simeth, HYDROcodone-acetaminophen, HYDROmorphone (DILAUDID) injection, ondansetron (  ZOFRAN) IV, ondansetron, sodium chloride Assessment/Plan: Active Problems:  Cellulitis of foot  Tinea pedis  Tobacco abuse  Family history of coronary artery disease  Will discontinue doxycycline as it is not add much to coverage. Continue vancomycin. Add Zosyn to cover gram negatives and anaerobes as well as pseudomonas. Add Betadine soaks twice a day. Continue  elevation.   LOS: 1 day   Henok Heacock L 12/25/2011, 4:38 PM

## 2011-12-26 DIAGNOSIS — F17200 Nicotine dependence, unspecified, uncomplicated: Secondary | ICD-10-CM

## 2011-12-26 DIAGNOSIS — L02619 Cutaneous abscess of unspecified foot: Secondary | ICD-10-CM

## 2011-12-26 DIAGNOSIS — L03119 Cellulitis of unspecified part of limb: Secondary | ICD-10-CM

## 2011-12-26 DIAGNOSIS — B353 Tinea pedis: Secondary | ICD-10-CM

## 2011-12-26 LAB — VANCOMYCIN, TROUGH: Vancomycin Tr: 9.2 ug/mL — ABNORMAL LOW (ref 10.0–20.0)

## 2011-12-26 MED ORDER — HYDROCODONE-ACETAMINOPHEN 5-325 MG PO TABS: ORAL_TABLET | ORAL | Status: DC

## 2011-12-26 MED ORDER — AMOXICILLIN-POT CLAVULANATE 875-125 MG PO TABS: 1.0000 | ORAL_TABLET | Freq: Two times a day (BID) | ORAL | Status: AC

## 2011-12-26 MED ORDER — ACETAMINOPHEN 325 MG PO TABS: 650.0000 mg | ORAL_TABLET | Freq: Four times a day (QID) | ORAL | Status: DC | PRN

## 2011-12-26 MED ORDER — VANCOMYCIN HCL IN DEXTROSE 1-5 GM/200ML-% IV SOLN
1000.0000 mg | Freq: Three times a day (TID) | INTRAVENOUS | Status: DC
  Administered 2011-12-26: 1000 mg via INTRAVENOUS
  Filled 2011-12-26 (×4): qty 200

## 2011-12-26 NOTE — Consult Note (Signed)
ANTIBIOTIC CONSULT NOTE   Pharmacy Consult for Vancomycin Indication: cellulitis (foot)  No Known Allergies  Patient Measurements: Height: 5\' 9"  (175.3 cm) Weight: 273 lb (123.832 kg) IBW/kg (Calculated) : 70.7   Vital Signs: Temp: 97.9 F (36.6 C) (05/23 0616) Temp src: Oral (05/23 0616) BP: 140/94 mmHg (05/23 0616) Pulse Rate: 104  (05/23 0616) Intake/Output from previous day: 05/22 0701 - 05/23 0700 In: 1240 [P.O.:1240] Out: -  Intake/Output from this shift:    Labs:  Basename 12/25/11 0519 12/24/11 1040  WBC 8.1 11.9*  HGB 15.6 17.0  PLT 250 263  LABCREA -- --  CREATININE 0.86 0.98   Estimated Creatinine Clearance: 158.8 ml/min (by C-G formula based on Cr of 0.86).  Basename 12/26/11 0528  VANCOTROUGH 9.2*  VANCOPEAK --  Drue Dun --  GENTTROUGH --  GENTPEAK --  GENTRANDOM --  TOBRATROUGH --  TOBRAPEAK --  TOBRARND --  AMIKACINPEAK --  AMIKACINTROU --  AMIKACIN --     Microbiology: No results found for this or any previous visit (from the past 720 hour(s)).  Medical History: Past Medical History  Diagnosis Date  . Tobacco abuse   . Family history of coronary artery disease 12/24/2011   Medications:  Scheduled:     . albuterol  2 puff Inhalation BID  . heparin subcutaneous  5,000 Units Subcutaneous Q8H  . nicotine  21 mg Transdermal Daily  . piperacillin-tazobactam (ZOSYN)  IV  3.375 g Intravenous Q8H  . sodium chloride  3 mL Intravenous Q12H  . terbinafine  1 application Topical BID  . vancomycin  1,000 mg Intravenous Q8H  . DISCONTD: doxycycline  100 mg Oral BID  . DISCONTD: vancomycin  1,250 mg Intravenous Q12H   Assessment: 36yo Male with wound on foot that has not healed despite doxycycline OP Rx.   [name] currently on Vancomycin & Zosyn. Vancomycin trough below desired goal.   Renal function stable and patient with some improvement per MD note.   Goal of Therapy:  Vancomycin trough level 10-15 mcg/ml  Plan: 1) Increase  Vancomycin 1000mg  IV q8hrs 2) Recheck weekly trough level while on vancomycin 3) Monitor renal function  Giovanni Bath, [facility] 12/26/2011,8:50 AM

## 2011-12-26 NOTE — Discharge Summary (Signed)
Pt dc'd to home with RX, and education materials after iv ABT is completed accompanied by wife. Stressed important of foot hygiene and a bottle of betadine solution given.

## 2011-12-26 NOTE — Care Management Note (Signed)
    Page 1 of 1   12/26/2011     2:13:04 PM   CARE MANAGEMENT NOTE 12/26/2011  Patient:  Douglas Dorsey, Douglas Dorsey   Account Number:  [id number]  Date Initiated:  12/26/2011  Documentation initiated by:  Rosemary Holms  Subjective/Objective Assessment:   Pt admitted from home with spouse. Pt has cellulitis of L foot. No PCP.     Action/Plan:   Spoke to pt at bedside. Pt states that his wife will be able to do the dressing changes at home. Pt advised to go to the [facility] health department to get established there. CM to assist with antibiotics at DC.   Anticipated DC Date:  12/26/2011   Anticipated DC Plan:  HOME/SELF CARE      DC Planning Services  CM consult      Choice offered to / List presented to:             Status of service:  Completed, signed off Medicare Important Message given?   (If response is "NO", the following Medicare IM given date fields will be blank) Date Medicare IM given:   Date Additional Medicare IM given:    Discharge Disposition:  HOME/SELF CARE  Per UR Regulation:    If discussed at Long Length of Stay Meetings, dates discussed:    Comments:  12/26/11 1400 Casmira Cramer Leanord Hawking RN BSN CM

## 2012-02-01 ENCOUNTER — Encounter (HOSPITAL_COMMUNITY): Payer: Self-pay

## 2012-02-01 ENCOUNTER — Emergency Department (HOSPITAL_COMMUNITY)
Admission: EM | Admit: 2012-02-01 | Discharge: 2012-02-01 | Disposition: A | Discharge status: Home / Self Care | Payer: Medicaid Other | Attending: Emergency Medicine | Admitting: Emergency Medicine

## 2012-02-01 ENCOUNTER — Emergency Department (HOSPITAL_COMMUNITY): Payer: Medicaid Other

## 2012-02-01 DIAGNOSIS — F17200 Nicotine dependence, unspecified, uncomplicated: Secondary | ICD-10-CM | POA: Insufficient documentation

## 2012-02-01 DIAGNOSIS — S93401A Sprain of unspecified ligament of right ankle, initial encounter: Secondary | ICD-10-CM

## 2012-02-01 DIAGNOSIS — X500XXA Overexertion from strenuous movement or load, initial encounter: Secondary | ICD-10-CM | POA: Insufficient documentation

## 2012-02-01 DIAGNOSIS — Z8249 Family history of ischemic heart disease and other diseases of the circulatory system: Secondary | ICD-10-CM | POA: Insufficient documentation

## 2012-02-01 DIAGNOSIS — Y92009 Unspecified place in unspecified non-institutional (private) residence as the place of occurrence of the external cause: Secondary | ICD-10-CM | POA: Insufficient documentation

## 2012-02-01 DIAGNOSIS — S93409A Sprain of unspecified ligament of unspecified ankle, initial encounter: Secondary | ICD-10-CM | POA: Insufficient documentation

## 2012-02-01 MED ORDER — HYDROCODONE-ACETAMINOPHEN 5-325 MG PO TABS
1.0000 | ORAL_TABLET | Freq: Once | ORAL | Status: AC
  Administered 2012-02-01: 1 via ORAL
  Filled 2012-02-01: qty 1

## 2012-02-01 MED ORDER — HYDROCODONE-ACETAMINOPHEN 5-325 MG PO TABS: 1.0000 | ORAL_TABLET | Freq: Four times a day (QID) | ORAL | Status: DC | PRN

## 2012-02-01 NOTE — ED Notes (Signed)
Twisted right ankle per pt.

## 2012-02-01 NOTE — ED Provider Notes (Signed)
Medical screening examination/treatment/procedure(s) were performed by non-physician practitioner and as supervising physician I was immediately available for consultation/collaboration.   Elim Economou L Segundo Makela, MD 02/01/12 2345 

## 2012-02-01 NOTE — ED Provider Notes (Signed)
History     CSN: [id number]  Arrival date & time 02/01/12  1904   First MD Initiated Contact with Patient 02/01/12 1919      Chief Complaint  Patient presents with  . Ankle Pain    (Consider location/radiation/quality/duration/timing/severity/associated sxs/prior treatment) HPI Comments: States ibuprofen irritates GI tract.  Has not taken any meds.  Patient is a 36 y.o. male presenting with ankle pain. The history is provided by the patient. No language interpreter was used.  Ankle Pain  The incident occurred yesterday. The incident occurred at home. Injury mechanism: rolled R foot when he stepped off the last step last PM.  denies any other injuries. The pain is present in the right ankle. The quality of the pain is described as throbbing. The pain is moderate. The pain has been constant since onset. Associated symptoms include inability to bear weight. Pertinent negatives include no numbness, no loss of motion, no muscle weakness, no loss of sensation and no tingling. He reports no foreign bodies present. The symptoms are aggravated by bearing weight and palpation. He has tried nothing for the symptoms.    Past Medical History  Diagnosis Date  . Tobacco abuse   . Family history of coronary artery disease 12/24/2011    Past Surgical History  Procedure Date  . Hernia repair     Umbilical    Family History  Problem Relation Age of Onset  . Diabetes Mother   . Hypertension Mother   . Heart failure Father   . Diabetes Brother   . Hypertension Brother     History  Substance Use Topics  . Smoking status: Current Everyday Smoker -- 2.0 packs/day    Types: Cigarettes  . Smokeless tobacco: Not on file  . Alcohol Use: No      Review of Systems  Musculoskeletal:       Ankle injury   Skin: Negative for wound.  Neurological: Negative for tingling, weakness and numbness.  All other systems reviewed and are negative.    Allergies  Review of patient's allergies  indicates no known allergies.  Home Medications   Current Outpatient Rx  Name Route Sig Dispense Refill  . HYDROCODONE-ACETAMINOPHEN 5-325 MG PO TABS  One tab po q 4-6 hrs prn pain 15 tablet 0  . HYDROCODONE-ACETAMINOPHEN 5-325 MG PO TABS Oral Take 1 tablet by mouth every 6 (six) hours as needed for pain. 20 tablet 0    BP 143/81  Pulse 110  Temp 98.7 F (37.1 C) (Oral)  Resp 20  Ht 5\' 9"  (1.753 m)  Wt 272 lb (123.378 kg)  BMI 40.17 kg/m2  SpO2 97%  Physical Exam  Nursing note and vitals reviewed. Constitutional: [name] oriented to person, place, and time. He appears well-developed and well-nourished.  HENT:  Head: Normocephalic and atraumatic.  Eyes: EOM are normal.  Neck: Normal range of motion.  Cardiovascular: Normal rate, regular rhythm, normal heart sounds and intact distal pulses.   Pulmonary/Chest: Effort normal and breath sounds normal. No respiratory distress.  Abdominal: Soft. He exhibits no distension. There is no tenderness.  Musculoskeletal: He exhibits tenderness.       Right ankle: He exhibits decreased range of motion and swelling. He exhibits no ecchymosis, no deformity, no laceration and normal pulse. tenderness. Lateral malleolus and medial malleolus tenderness found. No proximal fibula tenderness found.       Feet:  Neurological: [name] alert and oriented to person, place, and time. Coordination normal.  Skin: Skin is warm  and dry.  Psychiatric: He has a normal mood and affect. Judgment normal.    ED Course  Procedures (including critical care time)  Labs Reviewed - No data to display Dg Ankle Complete Right  02/01/2012  *RADIOLOGY REPORT*  Clinical Data: Pain and swelling.  Inversion injury.  RIGHT ANKLE - COMPLETE 3+ VIEW  Comparison: None.  Findings: There is pronounced lateral soft tissue swelling.  There is a small joint effusion.  No fracture or dislocation.  IMPRESSION: Lateral soft tissue swelling and small joint effusion.  No bony abnormalities  seen.  Original Report Authenticated By: Thomasenia Sales, M.D.     1. Sprain of ankle, right       MDM  ASO, crutches, ice, elevation rx-hydrocodone, 20 F/u with dr. Carollee Herter, PA 02/01/12 2024

## 2012-02-01 NOTE — ED Notes (Signed)
Pt instructed on use of crutches

## 2012-02-01 NOTE — Discharge Instructions (Signed)
Ankle Sprain An ankle sprain is an injury to the strong, fibrous tissues (ligaments) that hold the bones of your ankle joint together.  CAUSES Ankle sprain usually is caused by a fall or by twisting your ankle. People who participate in sports are more prone to these types of injuries.  SYMPTOMS  Symptoms of ankle sprain include:  Pain in your ankle. The pain may be present at rest or only when you are trying to stand or walk.   Swelling.   Bruising. Bruising may develop immediately or within 1 to 2 days after your injury.   Difficulty standing or walking.  DIAGNOSIS  Your caregiver will ask you details about your injury and perform a physical exam of your ankle to determine if you have an ankle sprain. During the physical exam, your caregiver will press and squeeze specific areas of your foot and ankle. Your caregiver will try to move your ankle in certain ways. An X-ray exam may be done to be sure a bone was not broken or a ligament did not separate from one of the bones in your ankle (avulsion).  TREATMENT  Certain types of braces can help stabilize your ankle. Your caregiver can make a recommendation for this. Your caregiver may recommend the use of medication for pain. If your sprain is severe, your caregiver may refer you to a surgeon who helps to restore function to parts of your skeletal system (orthopedist) or a physical therapist. HOME CARE INSTRUCTIONS  Apply ice to your injury for 1 to 2 days or as directed by your caregiver. Applying ice helps to reduce inflammation and pain.  Put ice in a plastic bag.   Place a towel between your skin and the bag.   Leave the ice on for 15 to 20 minutes at a time, every 2 hours while you are awake.   Take over-the-counter or prescription medicines for pain, discomfort, or fever only as directed by your caregiver.   Keep your injured leg elevated, when possible, to lessen swelling.   If your caregiver recommends crutches, use them as  instructed. Gradually, put weight on the affected ankle. Continue to use crutches or a cane until you can walk without feeling pain in your ankle.   If you have a plaster splint, wear the splint as directed by your caregiver. Do not rest it on anything harder than a pillow the first 24 hours. Do not put weight on it. Do not get it wet. You may take it off to take a shower or bath.   You may have been given an elastic bandage to wear around your ankle to provide support. If the elastic bandage is too tight (you have numbness or tingling in your foot or your foot becomes cold and blue), adjust the bandage to make it comfortable.   If you have an air splint, you may blow more air into it or let air out to make it more comfortable. You may take your splint off at night and before taking a shower or bath.   Wiggle your toes in the splint several times per day if you are able.  SEEK MEDICAL CARE IF:   You have an increase in bruising, swelling, or pain.   Your toes feel cold.   Pain relief is not achieved with medication.  SEEK IMMEDIATE MEDICAL CARE IF: Your toes are numb or blue or you have severe pain. MAKE SURE YOU:   Understand these instructions.   Will watch your condition.     Will get help right away if you are not doing well or get worse.  Document Released: 07/22/2005 Document Revised: 07/11/2011 Document Reviewed: 02/24/2008 [facility] Patient Information 2012 [place], [place].Crutch Use You have been prescribed crutches to take weight off one of your lower legs or feet (extremities). When using crutches, make sure you are not putting pressure on the armpit (axilla). This could cause damage to the nerves that extend from your axilla to the hand and arm. When fitted properly the crutches should be 2 to 3 finger widths below the axilla. Your weight should be supported by your hand, and not by resting upon the crutch with the axilla. When walking, first step with the crutches, then swing  the healthy leg through and slightly ahead. When going up stairs, first step up with the healthy leg and then follow with the crutches and injured leg up to the same step, and so forth. If there is a handrail, hold both crutches in one hand, place your other hand on the handrail, and while placing your weight on your arms, lift your good leg to the step, then bring the crutches and the injured leg up to that step. Repeat for each step. When going down stairs, first step with the injured leg and crutches, following down with the healthy leg to the same step. Be very careful, as going down stairs with crutches is very challenging. If you feel wobbly or nervous, sit down and inch yourself down the stairs on your butt. To get up from a chair, hold injured leg forward, grab armrest with one hand and the top of the crutches with the other hand. Using these supports, pull yourself up to a standing position. Reverse this procedure for sitting. See your caregiver for follow up as suggested. If you are discharged in an ace wrap and develop numbness, tingling, swelling, or increased pain, loosen the ace wrap and re-wrap looser. If these problems persist, see your caregiver as needed. If you have been instructed to use partial weight bearing, bear (apply) the amount of weight as suggested by your caregiver. Do not bear weight in an amount that causes pain on the area of injury. Document Released: 07/19/2000 Document Revised: 07/11/2011 Document Reviewed: 09/26/2008 [facility] Patient Information 2012 [place], [place].Cryotherapy Cryotherapy means treatment with cold. Ice or gel packs can be used to reduce both pain and swelling. Ice is the most helpful within the first 24 to 48 hours after an injury or flareup from overusing a muscle or joint. Sprains, strains, spasms, burning pain, shooting pain, and aches can all be eased with ice. Ice can also be used when recovering from surgery. Ice is effective, has very few side  effects, and is safe for most people to use. PRECAUTIONS  Ice is not a safe treatment option for people with:  Raynaud's phenomenon. This is a condition affecting small blood vessels in the extremities. Exposure to cold may cause your problems to return.   Cold hypersensitivity. There are many forms of cold hypersensitivity, including:   Cold urticaria. Red, itchy hives appear on the skin when the tissues begin to warm after being iced.   Cold erythema. This is a red, itchy rash caused by exposure to cold.   Cold hemoglobinuria. Red blood cells break down when the tissues begin to warm after being iced. The hemoglobin that carry oxygen are passed into the urine because they cannot combine with blood proteins fast enough.   Numbness or altered sensitivity in the area being iced.  If you have any of the following conditions, do not use ice until you have discussed cryotherapy with your caregiver:  Heart conditions, such as arrhythmia, angina, or chronic heart disease.   High blood pressure.   Healing wounds or open skin in the area being iced.   Current infections.   Rheumatoid arthritis.   Poor circulation.   Diabetes.  Ice slows the blood flow in the region it is applied. This is beneficial when trying to stop inflamed tissues from spreading irritating chemicals to surrounding tissues. However, if you expose your skin to cold temperatures for too long or without the proper protection, you can damage your skin or nerves. Watch for signs of skin damage due to cold. HOME CARE INSTRUCTIONS Follow these tips to use ice and cold packs safely.  Place a dry or damp towel between the ice and skin. A damp towel will cool the skin more quickly, so you may need to shorten the time that the ice is used.   For a more rapid response, add gentle compression to the ice.   Ice for no more than 10 to 20 minutes at a time. The bonier the area you are icing, the less time it will take to get the  benefits of ice.   Check your skin after 5 minutes to make sure there are no signs of a poor response to cold or skin damage.   Rest 20 minutes or more in between uses.   Once your skin is numb, you can end your treatment. You can test numbness by very lightly touching your skin. The touch should be so light that you do not see the skin dimple from the pressure of your fingertip. When using ice, most people will feel these normal sensations in this order: cold, burning, aching, and numbness.   Do not use ice on someone who cannot communicate their responses to pain, such as small children or people with dementia.  HOW TO MAKE AN ICE PACK Ice packs are the most common way to use ice therapy. Other methods include ice massage, ice baths, and cryo-sprays. Muscle creams that cause a cold, tingly feeling do not offer the same benefits that ice offers and should not be used as a substitute unless recommended by your caregiver. To make an ice pack, do one of the following:  Place crushed ice or a bag of frozen vegetables in a sealable plastic bag. Squeeze out the excess air. Place this bag inside another plastic bag. Slide the bag into a pillowcase or place a damp towel between your skin and the bag.   Mix 3 parts water with 1 part rubbing alcohol. Freeze the mixture in a sealable plastic bag. When you remove the mixture from the freezer, it will be slushy. Squeeze out the excess air. Place this bag inside another plastic bag. Slide the bag into a pillowcase or place a damp towel between your skin and the bag.  SEEK MEDICAL CARE IF:  You develop white spots on your skin. This may give the skin a blotchy (mottled) appearance.   Your skin turns blue or pale.   Your skin becomes waxy or hard.   Your swelling gets worse.  MAKE SURE YOU:   Understand these instructions.   Will watch your condition.   Will get help right away if you are not doing well or get worse.  Document Released: 03/18/2011  Document Revised: 07/11/2011 Document Reviewed: 03/18/2011 [facility] Patient Information 2012 [place], [place].  Wear the ASO splint for 2-3 weeks.  Apply ice several times daily and elevate foot as much as possible.  Use the crutches as needed and bear weight as tolerated.  Call dr. Romeo Apple and make an appt for further evaluation.

## 2012-02-05 ENCOUNTER — Ambulatory Visit (INDEPENDENT_AMBULATORY_CARE_PROVIDER_SITE_OTHER): Payer: Medicaid Other | Admitting: Orthopedic Surgery

## 2012-02-05 ENCOUNTER — Encounter: Payer: Self-pay | Admitting: Orthopedic Surgery

## 2012-02-05 VITALS — BP 148/90 | Ht 69.0 in | Wt 272.0 lb

## 2012-02-05 DIAGNOSIS — S93409A Sprain of unspecified ligament of unspecified ankle, initial encounter: Secondary | ICD-10-CM

## 2012-02-05 MED ORDER — HYDROCODONE-ACETAMINOPHEN 5-325 MG PO TABS: 1.0000 | ORAL_TABLET | Freq: Four times a day (QID) | ORAL | Status: AC | PRN

## 2012-02-05 NOTE — Patient Instructions (Signed)
Wear aso brace x 4 weeks   Weight bearing as tolerated   Ice and elevation

## 2012-02-08 ENCOUNTER — Encounter: Payer: Self-pay | Admitting: Orthopedic Surgery

## 2012-02-08 DIAGNOSIS — S93409A Sprain of unspecified ligament of unspecified ankle, initial encounter: Secondary | ICD-10-CM | POA: Insufficient documentation

## 2012-02-08 NOTE — Progress Notes (Signed)
  Subjective:    Douglas Dorsey is a 36 y.o. male who presents with right ankle pain. Onset of the symptoms was several days ago. Inciting event: inverted while walking. Current symptoms include: ability to bear weight, but with some pain, bruising, pain with inversion of the foot, stiffness and swelling. Aggravating factors: standing, walking  and weight bearing. Symptoms have stabilized. Patient has had no prior ankle problems. Evaluation to date: plain films: normal. Treatment to date: brace which is somewhat effective, rest and crutches. The following portions of the patient's history were reviewed and updated as appropriate: allergies, current medications, past family history, past medical history, past social history, past surgical history and problem list.   ROS REVIEWED AND SCANNED     Objective:    BP 148/90  Ht 5\' 9"  (1.753 m)  Wt 272 lb (123.378 kg)  BMI 40.17 kg/m2 Vital signs are stable as recorded  General appearance is normal, WEIGHT  EXCESSIVE   The patient is alert and oriented x3  The patient's mood and affect are normal  Gait assessment: CRUTCHES  The cardiovascular exam reveals normal pulses and temperature without edema swelling.  The lymphatic system is negative for palpable lymph nodes  The sensory exam is normal.  REFLEXES HAD TO BE DEFERRED   Balance is normal.   Right ankle:   ecchymosis noted  + effusion noted laterally negative findings: no ligamentous laxity positive findings: decreased dorsiflexion, decreased plantar flexion, decreased range of motion, ecchymosis , effustion  and tenderness over lateral malleolus on the right + tenderness over the lateral malleolus  Left ankle:   normal no effusion, full range of motion, no tenderness. no bruising noted   Imaging: X-ray of the right ankle(s): no fracture, dislocation, swelling or degenerative changes noted    Assessment:    Ankle sprain    Plan:    Natural history and expected course  discussed. Questions answered. Rest, ice, compression, elevation (RICE) therapy. Crutches and instructions provided. Transport planner distributed. ANKLE BRACE , PROGRESSIVE WT BEARING , CONTRAST BATHS

## 2012-02-12 NOTE — Discharge Summary (Signed)
Physician Discharge Summary  Patient ID: Douglas Dorsey MRN: [id number] DOB/AGE: 1976-01-28 36 y.o.  Admit date: 12/24/2011 Discharge date: 02/12/2012  Discharge Diagnoses:  Active Problems:  Cellulitis of foot  Tinea pedis  Tobacco abuse  Medication List  As of 02/12/2012  5:02 PM   STOP taking these medications         HYDROcodone-acetaminophen 5-325 MG per tablet         TAKE these medications         doxycycline 100 MG capsule   Commonly known as: VIBRAMYCIN   Take 1 capsule (100 mg total) by mouth 2 (two) times daily.            Discharge Orders    Future Appointments: Provider: Department: Dept Phone: Center:   03/11/2012 5:00 PM Vickki Hearing, MD [facility] [phone] [facility]     Future Orders Please Complete By Expires   Diet general      Discharge instructions      Comments:   Soak foot in betadine and warm water twice daily for a week, then cover with gauze. Keep foot elevated while seated or in bed. Return to the emergency room for worsening infection   Activity as tolerated - No restrictions      May shower / Bathe          Disposition: 01-Home or Self Care  Discharged Condition: stable  Consults:  none  Labs:    Sodium      139   Potassium      4.4   Chloride      102   CO2      23   Mean Plasma Glucose      131   BUN      18   Creatinine, Ser      0.86   Calcium      9.7   GFR calc non Af Amer      90 mL/min">90   GFR calc Af Amer      90 mL/min  The eGFR has been calculated using the CKD EPI equation. This calculation has not been validated in all clinical situations. eGFR's persistently <90 mL/min signify possible Chronic Kidney Disease.">9090 mL/min  The eGFR has been calculated using the CKD EPI equation. This calculation has not been validated in all clinical situations. eGFR's persistently <90 mL/min signify possible Chronic Kidney Disease." border=0  src="file://[email or web address]:/PROGRAM%20FILES%20(X86)/EPICSYS/V7.8/EN-US/Images/IP_COMMENT_[email or web address]" width=5 height=10   Glucose, Bld      127    CBC    WBC      8.1   RBC      5.42   Hemoglobin      15.6   HCT      47.5   MCV      87.6   MCH      28.8   MCHC      32.8   RDW      14.3   Platelets      250    DIFFERENTIAL    Neutrophils Relative      71   Lymphocytes Relative      21   Monocytes Relative      7   Eosinophils Relative      1   Basophils Relative      0   Neutro Abs      8.5   Lymphs Abs      2.5   Monocytes Absolute      0.8  Eosinophils Absolute      0.2   Basophils Absolute      0.0    DIABETES    Hemoglobin A1C      =6.5% Diagnostic of Diabetes Mellitus (if abnormal result is confirmed) 5.7-6.4% Increased risk of developing Diabetes Mellitus References:Diagnosis and Classification of Diabetes Mellitus,Diabetes Care,2011,34(Suppl 1):S62-S69 and Standards of Medical Care in ..."6.2 =6.5% Diagnostic of Diabetes Mellitus (if abnormal result is confirmed) 5.7-6.4% Increased risk of developing Diabetes Mellitus References:Diagnosis and Classification of Diabetes Mellitus,Diabetes Care,2011,34(Suppl 1):S62-S69 and Standards of Medical Care in ..." border=0 src="file:///C:/PROGRAM%20FILES%20([email or web address]/[email or web address]/EN-US/Images/[email or web address]" width=5 height=10   Glucose, Bld      127     Diagnostics:  Dg Ankle Complete Right  02/01/2012  *RADIOLOGY REPORT*  Clinical Data: Pain and swelling.  Inversion injury.  RIGHT ANKLE - COMPLETE 3+ VIEW  Comparison: None.  Findings: There is pronounced lateral soft tissue swelling.  There is a small joint effusion.  No fracture or dislocation.  IMPRESSION: Lateral soft tissue swelling and small joint effusion.  No bony abnormalities seen.  Original Report Authenticated By: Thomasenia Sales, M.D.    Procedures: none  Hospital Course: See H&P for complete admission details.  The patient presented to the ED initially on 5/15 with left foot  pain swelling and redness.  He was prescribed doxycycline and sent home. The infection worsened, so he came back to the ED.  He was afebrile and had a normal WBC.  He was noted to have erethyma, edema, and maceration with serosanguinous dranage involving several toes.  No fluctuance or abcess noted.  He was started on broad spectrum IV antibiotics.  His cellulitis improved. [name] discharged in stable condition.  Topical antifungals recommended.  Total time on the day of discharge greater than 30 minutes.  Discharge Exam:  Blood pressure 140/94, pulse 104, temperature 97.9 F (36.6 C), temperature source Oral, resp. rate 19, height 5\' 9"  (1.753 m), weight 123.832 kg (273 lb), SpO2 95.00%.  Foot with less swelling and erythema.  SignedChristiane Ha 02/12/2012, 5:02 PM

## 2012-03-11 ENCOUNTER — Ambulatory Visit (INDEPENDENT_AMBULATORY_CARE_PROVIDER_SITE_OTHER): Payer: Medicaid Other | Admitting: Orthopedic Surgery

## 2012-03-11 ENCOUNTER — Encounter: Payer: Self-pay | Admitting: Orthopedic Surgery

## 2012-03-11 VITALS — Ht 69.0 in | Wt 272.0 lb

## 2012-03-11 DIAGNOSIS — S93409A Sprain of unspecified ligament of unspecified ankle, initial encounter: Secondary | ICD-10-CM

## 2012-03-11 MED ORDER — HYDROCODONE-ACETAMINOPHEN 5-325 MG PO TABS: 1.0000 | ORAL_TABLET | Freq: Four times a day (QID) | ORAL | Status: AC | PRN

## 2012-03-11 NOTE — Patient Instructions (Addendum)
You have received a steroid shot. 15% of patients experience increased pain at the injection site with in the next 24 hours. This is best treated with ice and tylenol extra strength 2 tabs every 8 hours. If you are still having pain please call the office.   activities as tolerated  

## 2012-03-11 NOTE — Progress Notes (Signed)
Patient ID: SIDHANT HELDERMAN, male   DOB: Oct 29, 1975, 36 y.o.   MRN: [id number] Chief Complaint  Patient presents with  . Follow-up    4 week recheck on right ankle sprain.    Ht 5\' 9"  (1.753 m)  Wt 272 lb (123.378 kg)  BMI 40.17 kg/m2  Followup visit status post severe ankle sprain. He was treated with immobilization in a large brace followup visit today shows he still has some residual lateral gutter pain  Review of systems no catching locking or giving way he does have occasional limp  Is awake alert and oriented x3 mood and affect are normal he has regained his ankle range of motion no instability is detected mild peroneal musculature weakness. Skin is intact pulse and temperature are normal  Recommend injection lateral gutter  Injection completed without complication  Procedure note Verbal consent Timeout for to confirm  Right ankle as injection site Medications lidocaine 1% 3 cc, Depo-Medrol 40 mg The injection was performed as follows: The skin was cleaned with alcohol and sprayed with ethyl chloride The injection was performed without complication and a sterile bandage was applied  Patient will followup as needed  I refill his medication twice Norco 5 mg every 6 when necessary pain #61 refill

## 2012-08-07 ENCOUNTER — Emergency Department (HOSPITAL_COMMUNITY)
Admission: EM | Admit: 2012-08-07 | Discharge: 2012-08-07 | Disposition: A | Discharge status: Home / Self Care | Payer: Medicaid Other | Attending: Emergency Medicine | Admitting: Emergency Medicine

## 2012-08-07 ENCOUNTER — Encounter (HOSPITAL_COMMUNITY): Payer: Self-pay | Admitting: *Deleted

## 2012-08-07 DIAGNOSIS — K089 Disorder of teeth and supporting structures, unspecified: Secondary | ICD-10-CM | POA: Insufficient documentation

## 2012-08-07 DIAGNOSIS — H9209 Otalgia, unspecified ear: Secondary | ICD-10-CM | POA: Insufficient documentation

## 2012-08-07 DIAGNOSIS — K029 Dental caries, unspecified: Secondary | ICD-10-CM | POA: Insufficient documentation

## 2012-08-07 DIAGNOSIS — K0889 Other specified disorders of teeth and supporting structures: Secondary | ICD-10-CM

## 2012-08-07 DIAGNOSIS — F17200 Nicotine dependence, unspecified, uncomplicated: Secondary | ICD-10-CM | POA: Insufficient documentation

## 2012-08-07 MED ORDER — PENICILLIN V POTASSIUM 500 MG PO TABS: 500.0000 mg | ORAL_TABLET | Freq: Four times a day (QID) | ORAL | Status: AC

## 2012-08-07 MED ORDER — HYDROCODONE-ACETAMINOPHEN 5-325 MG PO TABS: ORAL_TABLET | ORAL | Status: DC

## 2012-08-07 NOTE — ED Provider Notes (Signed)
History     CSN: [id number]  Arrival date & time 08/07/12  1153   First MD Initiated Contact with Patient 08/07/12 1231      Chief Complaint  Patient presents with  . Dental Pain    (Consider location/radiation/quality/duration/timing/severity/associated sxs/prior treatment) Patient is a Douglas Dorsey presenting with tooth pain. The history is provided by the patient.  Dental PainThe primary symptoms include mouth pain. Primary symptoms do not include dental injury, oral bleeding, headaches, fever, shortness of breath, sore throat, angioedema or cough. The symptoms began 2 days ago. The symptoms are worsening. The symptoms are new. The symptoms occur constantly.  Mouth pain began 24 -48 hours ago. Mouth pain occurs constantly. Mouth pain is worsening. Affected locations include: teeth and gum(s).  Additional symptoms include: dental sensitivity to temperature, gum tenderness and ear pain. Additional symptoms do not include: gum swelling, purulent gums, trismus, facial swelling, trouble swallowing, pain with swallowing, drooling and swollen glands. Medical issues include: smoking and periodontal disease.    Past Medical History  Diagnosis Date  . Tobacco abuse   . Family history of coronary artery disease 12/24/2011    Past Surgical History  Procedure Date  . Hernia repair     Umbilical    Family History  Problem Relation Age of Onset  . Diabetes Mother   . Hypertension Mother   . Heart failure Father   . Diabetes Brother   . Hypertension Brother   . Lung disease      History  Substance Use Topics  . Smoking status: Current Every Day Smoker -- 2.0 packs/day    Types: Cigarettes  . Smokeless tobacco: Not on file  . Alcohol Use: No      Review of Systems  Constitutional: Negative for fever and appetite change.  HENT: Positive for ear pain and dental problem. Negative for congestion, sore throat, facial swelling, drooling, trouble swallowing, neck pain and neck  stiffness.   Eyes: Negative for pain and visual disturbance.  Respiratory: Negative for cough and shortness of breath.   Neurological: Negative for dizziness, facial asymmetry and headaches.  Hematological: Negative for adenopathy.  All other systems reviewed and are negative.    Allergies  Review of patient's allergies indicates no known allergies.  Home Medications  No current outpatient prescriptions on file.  BP 151/102  Pulse 106  Temp 97.9 F (36.6 C) (Oral)  Resp 16  SpO2 99%  Physical Exam  Nursing note and vitals reviewed. Constitutional: [name] oriented to person, place, and time. He appears well-developed and well-nourished. No distress.  HENT:  Head: Normocephalic and atraumatic. No trismus in the jaw.  Right Ear: Tympanic membrane and ear canal normal.  Left Ear: Tympanic membrane and ear canal normal.  Mouth/Throat: Uvula is midline, oropharynx is clear and moist and mucous membranes are normal. Dental caries present. No dental abscesses or uvula swelling.         Widespread periodontal disease. No obvious dental abscess  Neck: Normal range of motion. Neck supple.  Cardiovascular: Normal rate, regular rhythm, normal heart sounds and intact distal pulses.   No murmur heard. Pulmonary/Chest: Effort normal and breath sounds normal.  Musculoskeletal: Normal range of motion.  Lymphadenopathy:    He has no cervical adenopathy.  Neurological: [name] alert and oriented to person, place, and time. He exhibits normal muscle tone. Coordination normal.  Skin: Skin is warm and dry.    ED Course  Procedures (including critical care time)  Labs Reviewed -  No data to display No results found.      MDM    Multiple dental caries.  ttp of the left upper molars.  Tenderness along the right upper gumline.  No facial edema, trismus or obvious dental abscess  Pt agrees to f/u with a dentist.  Referral info given   Prescribed:  Pen VK  norco #[id number]   Jhalen Eley L.  Anora Schwenke, [place] 08/07/12 1708

## 2012-08-07 NOTE — ED Notes (Signed)
Dental pain x 2 days

## 2012-08-11 NOTE — ED Provider Notes (Signed)
History/physical exam/procedure(s) were performed by non-physician practitioner and as supervising physician I was immediately available for consultation/collaboration. I have reviewed all notes and am in agreement with care and plan.   [place] Nellums S Estaban [place], MD 08/11/12 1112 

## 2013-01-06 ENCOUNTER — Encounter (HOSPITAL_COMMUNITY): Payer: Self-pay | Admitting: Emergency Medicine

## 2013-01-06 ENCOUNTER — Emergency Department (HOSPITAL_COMMUNITY)
Admission: EM | Admit: 2013-01-06 | Discharge: 2013-01-06 | Disposition: A | Discharge status: Home / Self Care | Payer: Medicaid Other | Attending: Emergency Medicine | Admitting: Emergency Medicine

## 2013-01-06 ENCOUNTER — Emergency Department (HOSPITAL_COMMUNITY): Payer: Medicaid Other

## 2013-01-06 DIAGNOSIS — M25562 Pain in left knee: Secondary | ICD-10-CM

## 2013-01-06 DIAGNOSIS — M25469 Effusion, unspecified knee: Secondary | ICD-10-CM | POA: Insufficient documentation

## 2013-01-06 DIAGNOSIS — F17200 Nicotine dependence, unspecified, uncomplicated: Secondary | ICD-10-CM | POA: Insufficient documentation

## 2013-01-06 DIAGNOSIS — I10 Essential (primary) hypertension: Secondary | ICD-10-CM | POA: Insufficient documentation

## 2013-01-06 DIAGNOSIS — M25569 Pain in unspecified knee: Secondary | ICD-10-CM | POA: Insufficient documentation

## 2013-01-06 HISTORY — DX: Essential (primary) hypertension: I10

## 2013-01-06 MED ORDER — NAPROXEN 500 MG PO TABS: 500.0000 mg | ORAL_TABLET | Freq: Two times a day (BID) | ORAL | Status: DC

## 2013-01-06 MED ORDER — PREDNISONE 20 MG PO TABS: ORAL_TABLET | ORAL | Status: DC

## 2013-01-06 MED ORDER — OXYCODONE-ACETAMINOPHEN 5-325 MG PO TABS
2.0000 | ORAL_TABLET | Freq: Once | ORAL | Status: AC
  Administered 2013-01-06: 2 via ORAL
  Filled 2013-01-06: qty 2

## 2013-01-06 MED ORDER — OXYCODONE-ACETAMINOPHEN 5-325 MG PO TABS: 1.0000 | ORAL_TABLET | ORAL | Status: DC | PRN

## 2013-01-06 NOTE — ED Provider Notes (Signed)
History     CSN: [id number]  Arrival date & time 01/06/13  1654   First MD Initiated Contact with Patient 01/06/13 1721      Chief Complaint  Patient presents with  . Joint Swelling    (Consider location/radiation/quality/duration/timing/severity/associated sxs/prior treatment) Patient is a 37 y.o. male presenting with knee pain. The history is provided by the patient.  Knee Pain Location:  Knee Time since incident:  3 days Knee location:  L knee Pain details:    Quality:  Aching   Radiates to:  Does not radiate   Severity:  Moderate   Onset quality:  Gradual   Timing:  Constant   Progression:  Unchanged Chronicity:  Recurrent Dislocation: no   Foreign body present:  No foreign bodies Prior injury to area:  Yes Relieved by:  Nothing Ineffective treatments:  Movement Associated symptoms: swelling   Associated symptoms: no back pain, no decreased ROM, no fatigue, no fever, no itching, no neck pain, no numbness, no stiffness and no tingling     Past Medical History  Diagnosis Date  . Tobacco abuse   . Family history of coronary artery disease 12/24/2011  . Hypertension     Past Surgical History  Procedure Laterality Date  . Hernia repair      Umbilical    Family History  Problem Relation Age of Onset  . Diabetes Mother   . Hypertension Mother   . Heart failure Father   . Diabetes Brother   . Hypertension Brother   . Lung disease      History  Substance Use Topics  . Smoking status: Current Every Day Smoker -- 2.00 packs/day    Types: Cigarettes  . Smokeless tobacco: Not on file  . Alcohol Use: No      Review of Systems  Constitutional: Negative for fever, chills and fatigue.  HENT: Negative for neck pain.   Genitourinary: Negative for dysuria and difficulty urinating.  Musculoskeletal: Positive for joint swelling and arthralgias. Negative for back pain and stiffness.  Skin: Negative for color change, itching and wound.  Neurological: Negative for  dizziness, weakness and numbness.  Hematological: Negative for adenopathy.  All other systems reviewed and are negative.    Allergies  Review of patient's allergies indicates no known allergies.  Home Medications  No current outpatient prescriptions on file.  BP 142/93  Pulse 100  Temp(Src) 98.7 F (37.1 C)  Resp 20  Ht 5\' 9"  (1.753 m)  Wt 245 lb (111.131 kg)  BMI 36.16 kg/m2  SpO2 98%  Physical Exam  Nursing note and vitals reviewed. Constitutional: [name] oriented to person, place, and time. He appears well-developed and well-nourished. No distress.  Cardiovascular: Normal rate, regular rhythm, normal heart sounds and intact distal pulses.   Pulmonary/Chest: Effort normal and breath sounds normal.  Musculoskeletal: He exhibits edema and tenderness.  Diffuse ttp of the left knee. Moderate effusion.   No erythema, excessive warmth, or bony deformity of the knee.  DP pulse is brisk, distal sensation intact,  No calf pain or edema..  Neurological: [name] alert and oriented to person, place, and time. He exhibits normal muscle tone. Coordination normal.  Skin: Skin is warm and dry. No erythema.    ED Course  Procedures (including critical care time)  Labs Reviewed - No data to display Dg Knee Complete 4 Views Left  01/06/2013   *RADIOLOGY REPORT*  Clinical Data: Knee pain.  Joint swelling.  LEFT KNEE - COMPLETE 4+ VIEW  Comparison:  None.  Findings: Moderate to large joint effusion. No acute bony abnormality.  Specifically, no fracture, subluxation, or dislocation.  Soft tissues are intact.  Joint spaces are maintained.  IMPRESSION: Moderate to large joint effusion.  No acute bony abnormality.   Original Report Authenticated By: Charlett Nose, M.D.     Knee immob applied, pain improved.  Remains NV intact.  Pt has own crutches at home.   MDM    [id number]  Patient seen by Dr. Adriana Simas as well and care plan discussed.  Has effusion tot he left knee, pain reproduced with flexion.  no  erythema, excessive warmth, fever, chills or recent illness.  Clinical suspicion for septic joint is low.  Pt agrees to elevate, ice and close f/u with Dr. Romeo Apple this week  The patient appears reasonably screened and/or stabilized for discharge and I doubt any other medical condition or other [facility] requiring further screening, evaluation, or treatment in the ED at this time prior to discharge.       Darrious Youman L. Trisha Mangle, PA-C 01/10/13 2312

## 2013-01-06 NOTE — ED Notes (Signed)
Pt c/o left knee swelling x 3 days.

## 2013-01-18 NOTE — ED Provider Notes (Signed)
Medical screening examination/treatment/procedure(s) were conducted as a shared visit with non-physician practitioner(s) and myself.  I personally evaluated the patient during the encounter.  Low probability for septic joint.   Plain films of left knee show joint effusion  Donnetta Hutching, MD 01/18/13 1538

## 2013-01-20 ENCOUNTER — Encounter: Payer: Self-pay | Admitting: Gastroenterology

## 2013-04-28 ENCOUNTER — Ambulatory Visit: Payer: Medicaid Other | Admitting: Gastroenterology

## 2013-08-05 DIAGNOSIS — K635 Polyp of colon: Secondary | ICD-10-CM

## 2013-08-05 HISTORY — DX: Polyp of colon: K63.5

## 2013-11-17 ENCOUNTER — Encounter: Payer: Self-pay | Admitting: Internal Medicine

## 2013-11-30 NOTE — H&P (Signed)
  NTS SOAP Note  Vital Signs:  Vitals as of: 0/96/2836: Systolic 629: Diastolic 476: Heart Rate 546: Temp 98.14F: Height 55ft 9in: Weight 266Lbs 0 Ounces: Pain Level 7: BMI 39.28  BMI : 39.28 kg/m2  Subjective: This 38 Years 18 Months old Male presents for of an incisional hernia.  Had an incisional hernia over ten years ago with mesh.  Has been having increasing pain and swellling at two points on the abdominal wall.  Made worse with straining.  No fever, chills.  Review of Symptoms:  Constitutional:unremarkable   Head:unremarkable    Eyes:unremarkable   Nose/Mouth/Throat:unremarkable Cardiovascular:  unremarkable   Respiratory:  cough Gastrointestin    abdominal pain,nausea,heartburn Genitourinary:unremarkable       back pain dry skin, boils Hematolgic/Lymphatic:unremarkable     Allergic/Immunologic:unremarkable     Past Medical History:    Reviewed  Past Medical History  Surgical History: ventral herniorrhaphy with mesh in remote past Medical Problems: none Allergies: nkda Medications: none   Social History:Reviewed  Social History  Preferred Language: English Race:  White Ethnicity: Not Hispanic / Latino Age: 38 Years 5 Months Marital Status:  M Alcohol: unknown   Smoking Status: Current every day smoker reviewed on 11/30/2013 Started Date:  Packs per day: 1.50 Functional Status reviewed on 11/30/2013 ------------------------------------------------ Bathing: Normal Cooking: Normal Dressing: Normal Driving: Normal Eating: Normal Managing Meds: Normal Oral Care: Normal Shopping: Normal Toileting: Normal Transferring: Normal Walking: Normal Cognitive Status reviewed on 11/30/2013 ------------------------------------------------ Attention: Normal Decision Making: Normal Language: Normal Memory: Normal Motor: Normal Perception: Normal Problem Solving: Normal Visual and Spatial: Normal   Family History:   Reviewed  Family Health History Mother, Living; Heart disease;  Father, Deceased; Heart attack (myocardial infarction);     Objective Information: General:  Well appearing, well nourished in no distress. Heart:  RRR, no murmur or gallop.  Normal S1, S2.  No S3, S4.  Lungs:    CTA bilaterally, no wheezes, rhonchi, rales.  Breathing unlabored. Abdomen:Soft, NT/ND, no HSM, no masses.  Two reducible hernias on either side of an upper midline surgical scars.  Assessment:Incisional hernia  Diagnoses: 553.21 Incisional hernia (Incisional hernia without obstruction or gangrene)  Procedures: 50354 - OFFICE OUTPATIENT NEW 30 MINUTES    Plan:  Scheduled for laparoscopic incisioonal herniorrhaphy with mesh on 12/17/13.   Patient Education:Alternative treatments to surgery were discussed with patient (and family).  Risks and benefits  of procedure including bleeding, infection, and recurrence of the hernia were fully explained to the patient (and family) who gave informed consent. Patient/family questions were addressed.  Follow-up:Pending Surgery

## 2013-12-08 NOTE — Patient Instructions (Signed)
Your procedure is scheduled on: 12/17/13  Report to [facility] at  6:15   AM.  Call this number if you have problems the morning of surgery: [phone]   Remember:   Do not drink or eat food:After Midnight.  :  Take these medicines the morning of surgery with A SIP OF WATER: none   Do not wear jewelry, make-up or nail polish.  Do not wear lotions, powders, or perfumes. You may wear deodorant.  Do not shave 48 hours prior to surgery. Men may shave face and neck.  Do not bring valuables to the hospital.  Contacts, dentures or bridgework may not be worn into surgery.  Leave suitcase in the car. After surgery it may be brought to your room.  For patients admitted to the hospital, checkout time is 11:00 AM the day of discharge.   Patients discharged the day of surgery will not be allowed to drive home.    Special Instructions: Shower using CHG night before surgery and shower the day of surgery use CHG.  Use special wash - you have one bottle of CHG for all showers.  You should use approximately 1/2 of the bottle for each shower.   Please read over the following fact sheets that you were given: Pain Booklet, MRSA Information, Surgical Site Infection Prevention and Care and Recovery After Surgery  Hernia Repair with Laparoscope A hernia occurs when an internal organ pushes out through a weak spot in the belly (abdominal) wall muscles. Hernias most commonly occur in the groin and around the navel. Hernias can also occur through a cut by the surgeon (incision) after an abdominal operation. A hernia may be caused by:  Lifting heavy objects.  Prolonged coughing.  Straining to move your bowels. Hernias can often be pushed back into place (reduced). Most hernias tend to get worse over time. Problems occur when abdominal contents get stuck in the opening and the blood supply is blocked or impaired (incarcerated hernia). Because of these risks, you require surgery to repair the hernia. Your hernia  will be repaired using a laparoscope. Laparoscopic surgery is a type of minimally invasive surgery. It does not involve making a typical surgical cut (incision) in the skin. A laparoscope is a telescope-like rod and lens system. It is usually connected to a video camera and a light source so your caregiver can clearly see the operative area. The instruments are inserted through  to  inch (5 mm or 10 mm) openings in the skin at specific locations. A working and viewing space is created by blowing a small amount of carbon dioxide gas into the abdominal cavity. The abdomen is essentially blown up like a balloon (insufflated). This elevates the abdominal wall above the internal organs like a dome. The carbon dioxide gas is common to the human body and can be absorbed by tissue and removed by the respiratory system. Once the repair is completed, the small incisions will be closed with either stitches (sutures) or staples (just like a paper stapler only this staple holds the skin together). LET YOUR CAREGIVERS KNOW ABOUT:  Allergies.  Medications taken including herbs, eye drops, over the counter medications, and creams.  Use of steroids (by mouth or creams).  Previous problems with anesthetics or Novocaine.  Possibility of pregnancy, if this applies.  History of blood clots (thrombophlebitis).  History of bleeding or blood problems.  Previous surgery.  Other health problems. BEFORE THE PROCEDURE  Laparoscopy can be done either in a hospital or  out-patient clinic. You may be given a mild sedative to help you relax before the procedure. Once in the operating room, you will be given a general anesthesia to make you sleep (unless you and your caregiver choose a different anesthetic).  AFTER THE PROCEDURE  After the procedure you will be watched in a recovery area. Depending on what type of hernia was repaired, you might be admitted to the hospital or you might go home the same day. With this  procedure you may have less pain and scarring. This usually results in a quicker recovery and less risk of infection. HOME CARE INSTRUCTIONS   Bed rest is not required. You may continue your normal activities but avoid heavy lifting (more than 10 pounds) or straining.  Cough gently. If you are a smoker it is best to stop, as even the best hernia repair can break down with the continual strain of coughing.  Avoid driving until given the OK by your surgeon.  There are no dietary restrictions unless given otherwise.  TAKE ALL MEDICATIONS AS DIRECTED.  Only take over-the-counter or prescription medicines for pain, discomfort, or fever as directed by your caregiver. SEEK MEDICAL CARE IF:   There is increasing abdominal pain or pain in your incisions.  There is more bleeding from incisions, other than minimal spotting.  You feel light headed or faint.  You develop an unexplained fever, chills, and/or an oral temperature above 102 F (38.9 C).  You have redness, swelling, or increasing pain in the wound.  Pus coming from wound.  A foul smell coming from the wound or dressings. SEEK IMMEDIATE MEDICAL CARE IF:   You develop a rash.  You have difficulty breathing.  You have any allergic problems. MAKE SURE YOU:   Understand these instructions.  Will watch your condition.  Will get help right away if you are not doing well or get worse. Document Released: 07/22/2005 Document Revised: 10/14/2011 Document Reviewed: 06/21/2009 [facility] Patient Information 2014 [place], [place]. General Anesthesia, Adult, Care After Refer to this sheet in the next few weeks. These instructions provide you with information on caring for yourself after your procedure. Your health care provider may also give you more specific instructions. Your treatment has been planned according to current medical practices, but problems sometimes occur. Call your health care provider if you have any problems or  questions after your procedure. WHAT TO EXPECT AFTER THE PROCEDURE After the procedure, it is typical to experience:  Sleepiness.  Nausea and vomiting. HOME CARE INSTRUCTIONS  For the first 24 hours after general anesthesia:  Have a responsible person with you.  Do not drive a car. If you are alone, do not take public transportation.  Do not drink alcohol.  Do not take medicine that has not been prescribed by your health care provider.  Do not sign important papers or make important decisions.  You may resume a normal diet and activities as directed by your health care provider.  Change bandages (dressings) as directed.  If you have questions or problems that seem related to general anesthesia, call the hospital and ask for the anesthetist or anesthesiologist on call. SEEK MEDICAL CARE IF:  You have nausea and vomiting that continue the day after anesthesia.  You develop a rash. SEEK IMMEDIATE MEDICAL CARE IF:   You have difficulty breathing.  You have chest pain.  You have any allergic problems. Document Released: 10/28/2000 Document Revised: 03/24/2013 Document Reviewed: 02/04/2013 [facility] Patient Information 2014 [place], [place].

## 2013-12-09 ENCOUNTER — Encounter (HOSPITAL_COMMUNITY): Payer: Self-pay | Admitting: Pharmacy Technician

## 2013-12-09 ENCOUNTER — Other Ambulatory Visit: Payer: Self-pay

## 2013-12-09 ENCOUNTER — Encounter (HOSPITAL_COMMUNITY): Payer: Self-pay

## 2013-12-09 ENCOUNTER — Encounter (HOSPITAL_COMMUNITY)
Admission: RE | Admit: 2013-12-09 | Discharge: 2013-12-09 | Disposition: A | Discharge status: Home / Self Care | Payer: Medicaid Other | Source: Ambulatory Visit | Attending: General Surgery | Admitting: General Surgery

## 2013-12-09 DIAGNOSIS — Z01810 Encounter for preprocedural cardiovascular examination: Secondary | ICD-10-CM | POA: Insufficient documentation

## 2013-12-09 DIAGNOSIS — Z01812 Encounter for preprocedural laboratory examination: Secondary | ICD-10-CM | POA: Insufficient documentation

## 2013-12-09 HISTORY — DX: Adverse effect of unspecified anesthetic, initial encounter: T41.45XA

## 2013-12-09 HISTORY — DX: Other complications of anesthesia, initial encounter: T88.59XA

## 2013-12-09 HISTORY — DX: Gastro-esophageal reflux disease without esophagitis: K21.9

## 2013-12-09 LAB — BASIC METABOLIC PANEL
BUN: 13 mg/dL (ref 6–23)
CALCIUM: 9.6 mg/dL (ref 8.4–10.5)
CHLORIDE: 99 meq/L (ref 96–112)
CO2: 28 meq/L (ref 19–32)
CREATININE: 0.95 mg/dL (ref 0.50–1.35)
GFR calc Af Amer: >90 mL/min (ref >90)
GFR calc non Af Amer: >90 mL/min (ref >90)
GLUCOSE: 191 mg/dL — AB (ref 70–99)
Potassium: 4.4 mEq/L (ref 3.7–5.3)
SODIUM: 137 meq/L (ref 137–147)

## 2013-12-09 LAB — CBC WITH DIFFERENTIAL/PLATELET
Basophils Absolute: 0 10*3/uL (ref 0.0–0.1)
Basophils Relative: 0 % (ref 0–1)
EOS ABS: 0.3 10*3/uL (ref 0.0–0.7)
EOS PCT: 3 % (ref 0–5)
HEMATOCRIT: 43.7 % (ref 39.0–52.0)
Hemoglobin: 14.9 g/dL (ref 13.0–17.0)
LYMPHS ABS: 2.2 10*3/uL (ref 0.7–4.0)
LYMPHS PCT: 24 % (ref 12–46)
MCH: 28.6 pg (ref 26.0–34.0)
MCHC: 34.1 g/dL (ref 30.0–36.0)
MCV: 83.9 fL (ref 78.0–100.0)
MONO ABS: 0.5 10*3/uL (ref 0.1–1.0)
Monocytes Relative: 5 % (ref 3–12)
Neutro Abs: 6.2 10*3/uL (ref 1.7–7.7)
Neutrophils Relative %: 68 % (ref 43–77)
PLATELETS: 254 10*3/uL (ref 150–400)
RBC: 5.21 MIL/uL (ref 4.22–5.81)
RDW: 16.3 % — ABNORMAL HIGH (ref 11.5–15.5)
WBC: 9.2 10*3/uL (ref 4.0–10.5)

## 2013-12-09 NOTE — Pre-Procedure Instructions (Signed)
Patient given information to sign up for my chart at home. 

## 2013-12-15 SURGERY — Surgical Case
Anesthesia: *Unknown

## 2013-12-17 ENCOUNTER — Ambulatory Visit (HOSPITAL_COMMUNITY): Payer: Medicaid Other | Admitting: Anesthesiology

## 2013-12-17 ENCOUNTER — Ambulatory Visit (HOSPITAL_COMMUNITY)
Admission: RE | Admit: 2013-12-17 | Discharge: 2013-12-17 | Disposition: A | Discharge status: Home / Self Care | Payer: Medicaid Other | Source: Ambulatory Visit | Attending: General Surgery | Admitting: General Surgery

## 2013-12-17 ENCOUNTER — Encounter (HOSPITAL_COMMUNITY): Payer: Medicaid Other | Admitting: Anesthesiology

## 2013-12-17 ENCOUNTER — Encounter (HOSPITAL_COMMUNITY): Payer: Self-pay | Admitting: *Deleted

## 2013-12-17 ENCOUNTER — Encounter (HOSPITAL_COMMUNITY)
Admission: RE | Disposition: A | Discharge status: Home / Self Care | Payer: Self-pay | Source: Ambulatory Visit | Attending: General Surgery

## 2013-12-17 DIAGNOSIS — K432 Incisional hernia without obstruction or gangrene: Secondary | ICD-10-CM | POA: Insufficient documentation

## 2013-12-17 DIAGNOSIS — F17200 Nicotine dependence, unspecified, uncomplicated: Secondary | ICD-10-CM | POA: Insufficient documentation

## 2013-12-17 DIAGNOSIS — K219 Gastro-esophageal reflux disease without esophagitis: Secondary | ICD-10-CM | POA: Insufficient documentation

## 2013-12-17 DIAGNOSIS — Z6841 Body Mass Index (BMI) 40.0 and over, adult: Secondary | ICD-10-CM | POA: Insufficient documentation

## 2013-12-17 DIAGNOSIS — I10 Essential (primary) hypertension: Secondary | ICD-10-CM | POA: Insufficient documentation

## 2013-12-17 DIAGNOSIS — E669 Obesity, unspecified: Secondary | ICD-10-CM | POA: Insufficient documentation

## 2013-12-17 HISTORY — PX: INSERTION OF MESH: SHX5868

## 2013-12-17 HISTORY — PX: INCISIONAL HERNIA REPAIR: SHX193

## 2013-12-17 LAB — GLUCOSE, CAPILLARY: Glucose-Capillary: 131 mg/dL — ABNORMAL HIGH (ref 70–99)

## 2013-12-17 SURGERY — REPAIR, HERNIA, INCISIONAL, LAPAROSCOPIC
Anesthesia: General | Site: Abdomen

## 2013-12-17 MED ORDER — HYDROMORPHONE HCL PF 1 MG/ML IJ SOLN
0.5000 mg | INTRAMUSCULAR | Status: DC | PRN
  Administered 2013-12-17 (×2): 0.5 mg via INTRAVENOUS

## 2013-12-17 MED ORDER — FENTANYL CITRATE 0.05 MG/ML IJ SOLN
25.0000 ug | INTRAMUSCULAR | Status: DC | PRN
  Administered 2013-12-17 (×4): 50 ug via INTRAVENOUS
  Filled 2013-12-17: qty 2

## 2013-12-17 MED ORDER — BUTAMBEN-TETRACAINE-BENZOCAINE 2-2-14 % EX AERO
INHALATION_SPRAY | CUTANEOUS | Status: AC
  Filled 2013-12-17: qty 56

## 2013-12-17 MED ORDER — FENTANYL CITRATE 0.05 MG/ML IJ SOLN
INTRAMUSCULAR | Status: DC | PRN
  Administered 2013-12-17 (×4): 50 ug via INTRAVENOUS

## 2013-12-17 MED ORDER — SUCCINYLCHOLINE CHLORIDE 20 MG/ML IJ SOLN
INTRAMUSCULAR | Status: DC | PRN
  Administered 2013-12-17: 140 mg via INTRAVENOUS

## 2013-12-17 MED ORDER — METOPROLOL TARTRATE 1 MG/ML IV SOLN
INTRAVENOUS | Status: AC
  Filled 2013-12-17: qty 5

## 2013-12-17 MED ORDER — LIDOCAINE HCL (CARDIAC) 10 MG/ML IV SOLN
INTRAVENOUS | Status: DC | PRN
  Administered 2013-12-17: 20 mg via INTRAVENOUS

## 2013-12-17 MED ORDER — DEXTROSE 5 % IV SOLN
3.0000 g | INTRAVENOUS | Status: AC
  Administered 2013-12-17: 1 g via INTRAVENOUS
  Administered 2013-12-17: 2 g via INTRAVENOUS
  Filled 2013-12-17: qty 3000

## 2013-12-17 MED ORDER — PROPOFOL 10 MG/ML IV BOLUS
INTRAVENOUS | Status: DC | PRN
  Administered 2013-12-17 (×2): 50 mg via INTRAVENOUS
  Administered 2013-12-17: 200 mg via INTRAVENOUS

## 2013-12-17 MED ORDER — FENTANYL CITRATE 0.05 MG/ML IJ SOLN
INTRAMUSCULAR | Status: AC
  Filled 2013-12-17: qty 2

## 2013-12-17 MED ORDER — GLYCOPYRROLATE 0.2 MG/ML IJ SOLN
INTRAMUSCULAR | Status: DC | PRN
  Administered 2013-12-17: 0.4 mg via INTRAVENOUS
  Administered 2013-12-17: 0.2 mg via INTRAVENOUS

## 2013-12-17 MED ORDER — OXYCODONE-ACETAMINOPHEN 7.5-325 MG PO TABS: 1.0000 | ORAL_TABLET | ORAL | Status: DC | PRN

## 2013-12-17 MED ORDER — ONDANSETRON HCL 4 MG/2ML IJ SOLN
4.0000 mg | Freq: Once | INTRAMUSCULAR | Status: AC
  Administered 2013-12-17: 4 mg via INTRAVENOUS

## 2013-12-17 MED ORDER — ROCURONIUM BROMIDE 50 MG/5ML IV SOLN
INTRAVENOUS | Status: AC
  Filled 2013-12-17: qty 1

## 2013-12-17 MED ORDER — LACTATED RINGERS IV SOLN
INTRAVENOUS | Status: DC
  Administered 2013-12-17 (×2): via INTRAVENOUS

## 2013-12-17 MED ORDER — METOPROLOL TARTRATE 1 MG/ML IV SOLN
5.0000 mg | Freq: Once | INTRAVENOUS | Status: AC
  Administered 2013-12-17: 5 mg via INTRAVENOUS

## 2013-12-17 MED ORDER — POVIDONE-IODINE 10 % EX OINT
TOPICAL_OINTMENT | CUTANEOUS | Status: AC
  Filled 2013-12-17: qty 1

## 2013-12-17 MED ORDER — PROPOFOL 10 MG/ML IV EMUL
INTRAVENOUS | Status: AC
  Filled 2013-12-17: qty 20

## 2013-12-17 MED ORDER — CEFAZOLIN SODIUM-DEXTROSE 2-3 GM-% IV SOLR
INTRAVENOUS | Status: AC
  Filled 2013-12-17: qty 50

## 2013-12-17 MED ORDER — GLYCOPYRROLATE 0.2 MG/ML IJ SOLN
INTRAMUSCULAR | Status: AC
  Filled 2013-12-17: qty 2

## 2013-12-17 MED ORDER — KETOROLAC TROMETHAMINE 30 MG/ML IJ SOLN
30.0000 mg | Freq: Once | INTRAMUSCULAR | Status: AC
  Administered 2013-12-17: 30 mg via INTRAVENOUS
  Filled 2013-12-17: qty 1

## 2013-12-17 MED ORDER — FENTANYL CITRATE 0.05 MG/ML IJ SOLN
INTRAMUSCULAR | Status: AC
  Filled 2013-12-17: qty 5

## 2013-12-17 MED ORDER — CEFAZOLIN SODIUM 1-5 GM-% IV SOLN
INTRAVENOUS | Status: AC
  Filled 2013-12-17: qty 50

## 2013-12-17 MED ORDER — ARTIFICIAL TEARS OP OINT
TOPICAL_OINTMENT | OPHTHALMIC | Status: AC
  Filled 2013-12-17: qty 3.5

## 2013-12-17 MED ORDER — MIDAZOLAM HCL 2 MG/2ML IJ SOLN
INTRAMUSCULAR | Status: AC
  Filled 2013-12-17: qty 2

## 2013-12-17 MED ORDER — ONDANSETRON HCL 4 MG/2ML IJ SOLN
INTRAMUSCULAR | Status: AC
  Filled 2013-12-17: qty 2

## 2013-12-17 MED ORDER — ROCURONIUM BROMIDE 100 MG/10ML IV SOLN
INTRAVENOUS | Status: DC | PRN
  Administered 2013-12-17: 5 mg via INTRAVENOUS
  Administered 2013-12-17: 35 mg via INTRAVENOUS
  Administered 2013-12-17: 5 mg via INTRAVENOUS
  Administered 2013-12-17: 10 mg via INTRAVENOUS
  Administered 2013-12-17: 20 mg via INTRAVENOUS

## 2013-12-17 MED ORDER — MIDAZOLAM HCL 2 MG/2ML IJ SOLN
1.0000 mg | INTRAMUSCULAR | Status: DC | PRN
  Administered 2013-12-17 (×2): 2 mg via INTRAVENOUS

## 2013-12-17 MED ORDER — METOPROLOL TARTRATE 1 MG/ML IV SOLN
INTRAVENOUS | Status: DC | PRN
  Administered 2013-12-17: 5 mg via INTRAVENOUS

## 2013-12-17 MED ORDER — BUPIVACAINE HCL (PF) 0.5 % IJ SOLN
INTRAMUSCULAR | Status: AC
  Filled 2013-12-17: qty 30

## 2013-12-17 MED ORDER — CHLORHEXIDINE GLUCONATE 4 % EX LIQD: 1.0000 "application " | Freq: Once | CUTANEOUS | Status: DC

## 2013-12-17 MED ORDER — LIDOCAINE HCL (PF) 1 % IJ SOLN
INTRAMUSCULAR | Status: AC
  Filled 2013-12-17: qty 5

## 2013-12-17 MED ORDER — 0.9 % SODIUM CHLORIDE (POUR BTL) OPTIME
TOPICAL | Status: DC | PRN
  Administered 2013-12-17: 1000 mL

## 2013-12-17 MED ORDER — NEOSTIGMINE METHYLSULFATE 10 MG/10ML IV SOLN
INTRAVENOUS | Status: AC
  Filled 2013-12-17: qty 1

## 2013-12-17 MED ORDER — HYDROMORPHONE HCL PF 1 MG/ML IJ SOLN
INTRAMUSCULAR | Status: AC
  Filled 2013-12-17: qty 1

## 2013-12-17 MED ORDER — BUPIVACAINE HCL (PF) 0.5 % IJ SOLN
INTRAMUSCULAR | Status: DC | PRN
  Administered 2013-12-17: 10 mL

## 2013-12-17 MED ORDER — ONDANSETRON HCL 4 MG/2ML IJ SOLN
4.0000 mg | Freq: Once | INTRAMUSCULAR | Status: AC | PRN
  Administered 2013-12-17: 4 mg via INTRAVENOUS
  Filled 2013-12-17: qty 2

## 2013-12-17 MED ORDER — GLYCOPYRROLATE 0.2 MG/ML IJ SOLN
INTRAMUSCULAR | Status: AC
  Filled 2013-12-17: qty 1

## 2013-12-17 MED ORDER — NEOSTIGMINE METHYLSULFATE 10 MG/10ML IV SOLN
INTRAVENOUS | Status: DC | PRN
  Administered 2013-12-17: 1 mg via INTRAVENOUS
  Administered 2013-12-17: 2 mg via INTRAVENOUS

## 2013-12-17 SURGICAL SUPPLY — 47 items
BAG HAMPER (MISCELLANEOUS) ×3 IMPLANT
CLOTH BEACON ORANGE TIMEOUT ST (SAFETY) ×3 IMPLANT
COVER LIGHT HANDLE STERIS (MISCELLANEOUS) ×6 IMPLANT
DECANTER SPIKE VIAL GLASS SM (MISCELLANEOUS) ×3 IMPLANT
DERMABOND ADVANCED (GAUZE/BANDAGES/DRESSINGS) ×2
DERMABOND ADVANCED .7 DNX12 (GAUZE/BANDAGES/DRESSINGS) ×1 IMPLANT
DEVICE TROCAR PUNCTURE CLOSURE (ENDOMECHANICALS) ×3 IMPLANT
DURAPREP 26ML APPLICATOR (WOUND CARE) ×3 IMPLANT
ELECT REM PT RETURN 9FT ADLT (ELECTROSURGICAL) ×3
ELECTRODE REM PT RTRN 9FT ADLT (ELECTROSURGICAL) ×1 IMPLANT
FILTER SMOKE EVAC LAPAROSHD (FILTER) ×3 IMPLANT
GLOVE BIOGEL PI IND STRL 7.0 (GLOVE) ×2 IMPLANT
GLOVE BIOGEL PI IND STRL 7.5 (GLOVE) ×1 IMPLANT
GLOVE BIOGEL PI INDICATOR 7.0 (GLOVE) ×4
GLOVE BIOGEL PI INDICATOR 7.5 (GLOVE) ×2
GLOVE ECLIPSE 7.0 STRL STRAW (GLOVE) ×3 IMPLANT
GLOVE SURG SS PI 7.5 STRL IVOR (GLOVE) ×6 IMPLANT
GOWN STRL REUS W/TWL LRG LVL3 (GOWN DISPOSABLE) ×9 IMPLANT
INST SET LAPROSCOPIC AP (KITS) ×3 IMPLANT
IV NS IRRIG 3000ML ARTHROMATIC (IV SOLUTION) IMPLANT
KIT ROOM TURNOVER APOR (KITS) ×3 IMPLANT
LIGASURE 5MM LAPAROSCOPIC (INSTRUMENTS) IMPLANT
LIGASURE LAP ATLAS 10MM 37CM (INSTRUMENTS) ×3 IMPLANT
MANIFOLD NEPTUNE II (INSTRUMENTS) ×3 IMPLANT
MESH VENTRALIGHT ST 6X8 (Mesh Specialty) ×2 IMPLANT
MESH VENTRLGHT ELLIPSE 8X6XMFL (Mesh Specialty) ×1 IMPLANT
NEEDLE INSUFFLATION 14GA 120MM (NEEDLE) ×3 IMPLANT
NS IRRIG 1000ML POUR BTL (IV SOLUTION) ×3 IMPLANT
PACK LAP CHOLE LZT030E (CUSTOM PROCEDURE TRAY) ×3 IMPLANT
PAD ARMBOARD 7.5X6 YLW CONV (MISCELLANEOUS) ×3 IMPLANT
PENCIL HANDSWITCHING (ELECTRODE) ×3 IMPLANT
SET BASIN LINEN APH (SET/KITS/TRAYS/PACK) ×3 IMPLANT
SET TUBE IRRIG SUCTION NO TIP (IRRIGATION / IRRIGATOR) IMPLANT
SLEEVE ENDOPATH XCEL 5M (ENDOMECHANICALS) IMPLANT
SPONGE GAUZE 2X2 8PLY STER LF (GAUZE/BANDAGES/DRESSINGS) ×3
SPONGE GAUZE 2X2 8PLY STRL LF (GAUZE/BANDAGES/DRESSINGS) ×6 IMPLANT
STAPLER VISISTAT (STAPLE) ×3 IMPLANT
SUT PROLENE 0 CT 1 CR/8 (SUTURE) ×3 IMPLANT
SUT VICRYL 0 UR6 27IN ABS (SUTURE) ×3 IMPLANT
TACKER 5MM HERNIA 3.5CML NAB (ENDOMECHANICALS) ×6 IMPLANT
TOWEL OR 17X26 4PK STRL BLUE (TOWEL DISPOSABLE) IMPLANT
TRAY FOLEY CATH 16FR SILVER (SET/KITS/TRAYS/PACK) ×3 IMPLANT
TROCAR ENDO BLADELESS 11MM (ENDOMECHANICALS) ×3 IMPLANT
TROCAR XCEL NON-BLD 5MMX100MML (ENDOMECHANICALS) ×3 IMPLANT
TROCAR XCEL UNIV SLVE 11M 100M (ENDOMECHANICALS) ×6 IMPLANT
TUBING INSUFFLATION (TUBING) ×3 IMPLANT
WARMER LAPAROSCOPE (MISCELLANEOUS) ×3 IMPLANT

## 2013-12-17 NOTE — Op Note (Signed)
Patient:  Douglas Dorsey  DOB:  [birth year]  MRN:  [id number]   Preop Diagnosis:  Incisional hernia  Postop Diagnosis:  Same  Procedure:  Laparoscopic incisional herniorrhaphy with mesh  Surgeon:  Aviva Signs, M.D.  Anes:  General endotracheal  Indications:  Patient is a 38 year old obese white male who underwent a ventral herniorrhaphy in the past and now presents with a recurrence of the hernia. The risks and benefits of the procedure including bleeding, infection, bowel injury, and a possibly recurrence of the hernia as well as an open procedure were fully explained to the patient, who gave informed consent.  Procedure note:  The patient was placed in the supine position. After induction of general endotracheal anesthesia, the abdomen was prepped and draped using usual sterile technique with DuraPrep. Surgical site confirmation was performed.  A small incision was made in the epigastric region and a Veress needle was introduced into the abdominal cavity without difficulty. Confirmation of placement was done using the saline drop test. The abdomen was then insufflated to 16 mm mercury pressure. An 11 mm trocar was introduced into the left upper quadrant under direct visualization without difficulty. An additional 11 mm trocar was placed in the right upper quadrant and another one placed left lower quadrant, both under direct visualization. The hernia defect was noted to be in the periumbilical region. Omentum was noted to be incarcerated within it. It was reduced using the LigaSure without difficulty. No intestine was noted within the hernia sac. It appeared that the previously seen dual hernias were actually just 1 hernia. A 15 x 20 cm Ventralight ST mesh was then inserted and tapped into 4 areas using 0 Prolene interrupted sutures. Greater than 2-3 cm overlay was noted circumferentially around the hernia defect. A pro-tacker was then used to circumferentially tack the mesh to the abdominal wall  in a 2 layer fashion. No bleeding was noted the end of the procedure. All air was then evacuated from the abdominal cavity prior to removal of the trochars.  All wounds were irrigated normal saline. All wounds were checked with 0.5% Sensorcaine. All incisions were closed using staples or Dermabond. Betadine ointment and dry sterile dressings were applied.  All tape and needle counts were correct at the end of the procedure. Patient was extubated in the operating room and transferred to PACU in stable condition.    Complications:  None  EBL:  Minimal  Specimen:  None

## 2013-12-17 NOTE — Transfer of Care (Signed)
Immediate Anesthesia Transfer of Care Note  Patient: Douglas Dorsey  Procedure(s) Performed: Procedure(s): LAPAROSCOPIC INCISIONAL HERNIA REPAIR WITH MESH (N/A)  Patient Location: PACU  Anesthesia Type:General  Level of Consciousness: sedated and patient cooperative  Airway & Oxygen Therapy: Patient Spontanous Breathing and non-rebreather face mask  Post-op Assessment: Report given to PACU RN, Post -op Vital signs reviewed and stable and Patient moving all extremities  Post vital signs: Reviewed and stable  Complications: No apparent anesthesia complications

## 2013-12-17 NOTE — Interval H&P Note (Signed)
History and Physical Interval Note:  12/17/2013 7:14 AM  Douglas Dorsey  has presented today for surgery, with the diagnosis of incisional hernia  The various methods of treatment have been discussed with the patient and family. After consideration of risks, benefits and other options for treatment, the patient has consented to  Procedure(s): Neville (N/A) as a surgical intervention .  The patient's history has been reviewed, patient examined, no change in status, stable for surgery.  I have reviewed the patient's chart and labs.  Questions were answered to the patient's satisfaction.     Jamesetta So

## 2013-12-17 NOTE — Discharge Instructions (Signed)
Laparoscopic Ventral Hernia Repair Care After Refer to this sheet in the next few weeks. These instructions provide you with information on caring for yourself after your procedure. Your caregiver may also give you more specific instructions. Your treatment has been planned according to current medical practices, but problems sometimes occur. Call your caregiver if you have any problems or questions after your procedure.  HOME CARE INSTRUCTIONS   Only take over-the-counter or prescription medicines as directed by your caregiver. If antibiotic medicines are prescribed, take them as directed. Finish them even if you start to feel better.  Always wash your hands before touching your abdomen.  Take your bandages (dressings) off after 48 hours or as directed by your caregiver. You may have skin adhesive strips or skin glue over the surgical cuts (incisions). Do not take the strips off or peel the glue off. These will fall off on their own.  Take showers once your caregiver approves. Until then, only take sponge baths. Do not take tub baths or go swimming until your caregiver approves.  Check your incision area every day for swelling, redness, warmth, and blood or fluid leaking from the incision. These are signs of infection. Wash your hands before you check.  Hold a pillow over your abdomen when you cough or sneeze to help ease pain.  Eat foods that are high in fiber, such as whole grains, fruits, and vegetables. Fiber helps prevent difficult bowel movements (constipation).  Drink enough fluids to keep your urine clear or pale yellow.  Rest and lessen activity for 4 5 days after the surgery. You may take short walks if your caregiver approves. Do not drive until approved by your caregiver.  Do not lift anything heavy, participate in sports, or have sexual intercourse for 6 8 weeks or until your caregiver approves.   Ask your caregiver when you can return to work.  Keep all follow-up  appointments. SEEK MEDICAL CARE IF:   You have pain even after taking pain medicine.  You have not had a bowel movement in 3 days.  You have cramps or are nauseous. SEEK IMMEDIATE MEDICAL CARE IF:   You have pain or swelling that is getting worse.  You have redness around your incisions.  Your incision is pulling apart.  You have blood or fluid leaking from your incisions.  You are vomiting.  You cannot pass urine. MAKE SURE YOU:   Understand these instructions.  Will watch your condition.  Will get help right away if you are not doing well or get worse. Document Released: 07/08/2012 Document Reviewed: 07/08/2012 [facility] Patient Information 2014 [place], [place].

## 2013-12-17 NOTE — Anesthesia Procedure Notes (Signed)
Procedure Name: Intubation Date/Time: 12/17/2013 7:43 AM Performed by: Vista Deck Pre-anesthesia Checklist: Patient identified, Patient being monitored, Timeout performed, Emergency Drugs available and Suction available Patient Re-evaluated:Patient Re-evaluated prior to inductionOxygen Delivery Method: Circle System Utilized Preoxygenation: Pre-oxygenation with 100% oxygen Intubation Type: IV induction Ventilation: Mask ventilation without difficulty Laryngoscope Size: Miller and 2 Grade View: Grade I Tube type: Oral Tube size: 7.0 mm Number of attempts: 1 Airway Equipment and Method: stylet Placement Confirmation: ETT inserted through vocal cords under direct vision,  positive ETCO2 and breath sounds checked- equal and bilateral Secured at: 21 cm Tube secured with: Tape Dental Injury: Teeth and Oropharynx as per pre-operative assessment

## 2013-12-17 NOTE — Anesthesia Preprocedure Evaluation (Addendum)
Anesthesia Evaluation  Patient identified by MRN, date of birth, ID band Patient awake    Reviewed: Allergy & Precautions, H&P , NPO status , Patient's Chart, lab work & pertinent test results  History of Anesthesia Complications (+) PROLONGED EMERGENCE and history of anesthetic complications  Airway Mallampati: I TM Distance: >3 FB Neck ROM: Full    Dental  (+) Poor Dentition, Dental Advisory Given   Pulmonary Current Smoker,  breath sounds clear to auscultation        Cardiovascular hypertension, Rhythm:Regular Rate:Tachycardia     Neuro/Psych    GI/Hepatic GERD-  Controlled and Medicated,  Endo/Other    Renal/GU      Musculoskeletal   Abdominal   Peds  Hematology   Anesthesia Other Findings   Reproductive/Obstetrics                          Anesthesia Physical Anesthesia Plan  ASA: III  Anesthesia Plan: General   Post-op Pain Management:    Induction: Intravenous  Airway Management Planned: Oral ETT  Additional Equipment:   Intra-op Plan:   Post-operative Plan: Extubation in OR  Informed Consent: I have reviewed the patients History and Physical, chart, labs and discussed the procedure including the risks, benefits and alternatives for the proposed anesthesia with the patient or authorized representative who has indicated his/her understanding and acceptance.     Plan Discussed with:   Anesthesia Plan Comments: (Lopressor 5mg  preop)        Anesthesia Quick Evaluation

## 2013-12-17 NOTE — Anesthesia Postprocedure Evaluation (Signed)
  Anesthesia Post-op Note  Patient: Douglas Dorsey  Procedure(s) Performed: Procedure(s): LAPAROSCOPIC INCISIONAL HERNIA REPAIR WITH MESH (N/A)  Patient Location: PACU  Anesthesia Type:General  Level of Consciousness: awake and patient cooperative  Airway and Oxygen Therapy: Patient Spontanous Breathing  Post-op Pain: mild  Post-op Assessment: Post-op Vital signs reviewed, Patient's Cardiovascular Status Stable, Respiratory Function Stable and No signs of Nausea or vomiting  Post-op Vital Signs: Reviewed and stable    Complications: No apparent anesthesia complications

## 2013-12-20 ENCOUNTER — Encounter (HOSPITAL_COMMUNITY): Payer: Self-pay | Admitting: General Surgery

## 2013-12-21 ENCOUNTER — Ambulatory Visit: Payer: Medicaid Other | Admitting: Gastroenterology

## 2013-12-28 ENCOUNTER — Encounter: Payer: Self-pay | Admitting: Gastroenterology

## 2013-12-28 ENCOUNTER — Ambulatory Visit (INDEPENDENT_AMBULATORY_CARE_PROVIDER_SITE_OTHER): Payer: Medicaid Other | Admitting: Gastroenterology

## 2013-12-28 VITALS — BP 129/80 | HR 107 | Temp 97.4°F | Ht 69.0 in | Wt 276.4 lb

## 2013-12-28 DIAGNOSIS — K921 Melena: Secondary | ICD-10-CM

## 2013-12-28 DIAGNOSIS — K219 Gastro-esophageal reflux disease without esophagitis: Secondary | ICD-10-CM | POA: Insufficient documentation

## 2013-12-28 MED ORDER — OMEPRAZOLE 20 MG PO CPDR: 20.0000 mg | DELAYED_RELEASE_CAPSULE | Freq: Every day | ORAL | Status: DC

## 2013-12-28 NOTE — Assessment & Plan Note (Signed)
38 year old gentleman with chronic worsening blood per rectum. Denies any diarrhea or constipation. Symptoms present over one year. Denies melena. Complains of refractory heartburn although has not been on PPI therapy. Associated with vomiting but no dysphagia. Patient recently had incisional hernia repair with mesh 11 days ago. Needs EGD/TCS once cleared by surgery.  I have discussed the risks, alternatives, benefits with regards to but not limited to the risk of reaction to medication, bleeding, infection, perforation and the patient is agreeable to proceed. Written consent to be obtained.   1. Upper endoscopy and colonoscopy once he is cleared by Dr. Arnoldo Morale. They'll call as soon as they have this information, scheduled to see Dr. Arnoldo Morale Thursday. Will schedule once information provided. 2. Notable subcutaneous edema in the right lower quadrant postoperatively. If abdominal pain worsens, develops erythema or drainage, they should call Dr. Arnoldo Morale immediately or go to the nearest ER. 3. Start Prilosec 20 mg daily before breakfast. May continue Zantac for the next week and hopefully come off once Prilosec is working.

## 2013-12-28 NOTE — Progress Notes (Addendum)
Primary Care Physician:  ALLEN, DEBRA, NP  Primary Gastroenterologist:  Michael Rourk, MD   Chief Complaint  Patient presents with  . Rectal Bleeding    HPI:  Douglas Dorsey is a 38 y.o. male here for further evaluation of rectal bleeding and history of heme positive stool per request of Marietta Arocena, NP with [facility]. Had incisional hernia repair by Dr. Mark Jenkins on 12/17/13.   Referred last year but was not able to keep appointment. Intermittent rectal bleeding going on for over one year. Used to seem to be related to ibuprofen. Now every day without being on NSAIDs. Usually BMs very regular. Brbrp with clots at times. No abdominal pain or rectal pain. Bad acid reflux. Trying Zantac, helps some. Throws up at night due to reflux. TUMS, one bottle every two days. No dysphagia. + Regurgitation. Feels lump in throat. GERD for over 10 years. Never been on a PPI. No prior upper endoscopy or colonoscopy. No family history of colon cancer or IBD. No ASA/NSAIDs.  Current Outpatient Prescriptions  Medication Sig Dispense Refill  . oxyCODONE-acetaminophen (PERCOCET) 7.5-325 MG per tablet Take 1 tablet by mouth every 4 (four) hours as needed for pain.      . ranitidine (ZANTAC) 150 MG tablet Take 150 mg by mouth 2 (two) times daily.       No current facility-administered medications for this visit.    Allergies as of 12/28/2013  . (No Known Allergies)    Past Medical History  Diagnosis Date  . Tobacco abuse   . Family history of coronary artery disease 12/24/2011  . Hypertension   . Complication of anesthesia     pt sts "they told me it took longer for them to wake me up than it should have".  . GERD (gastroesophageal reflux disease)     Past Surgical History  Procedure Laterality Date  . Hernia repair      Umbilical  . Teeth extracted      12 teeth removed in [place]  . Incisional hernia repair N/A 12/17/2013    Procedure: LAPAROSCOPIC INCISIONAL HERNIA  REPAIR WITH MESH;  Surgeon: Mark A Jenkins, MD;  Location: [facility];  Service: General;  Laterality: N/A;  . Insertion of mesh N/A 12/17/2013    Procedure: INSERTION OF MESH;  Surgeon: Mark A Jenkins, MD;  Location: [facility];  Service: General;  Laterality: N/A;    Family History  Problem Relation Age of Onset  . Diabetes Mother   . Hypertension Mother   . Heart failure Father   . Diabetes Brother   . Hypertension Brother   . Lung disease      History   Social History  . Marital Status: Married    Spouse Name: N/A    Number of Children: N/A  . Years of Education: N/A   Occupational History  . Not on file.   Social History Main Topics  . Smoking status: Current Every Day Smoker -- 2.00 packs/day for 25 years    Types: Cigarettes  . Smokeless tobacco: Not on file  . Alcohol Use: Yes     Comment: seldom  . Drug Use: No  . Sexual Activity: Yes    Birth Control/ Protection: None   Other Topics Concern  . Not on file   Social History Narrative  . No narrative on file      ROS:  General: Negative for anorexia, weight loss, fever, chills, fatigue, weakness. Eyes: Negative for vision changes.    ENT: Negative for hoarseness, difficulty swallowing , nasal congestion. CV: Negative for chest pain, angina, palpitations, dyspnea on exertion, peripheral edema.  Respiratory: Negative for dyspnea at rest, dyspnea on exertion, cough, sputum, wheezing.  GI: See history of present illness. GU:  Negative for dysuria, hematuria, urinary incontinence, urinary frequency, nocturnal urination.  MS: Negative for joint pain, low back pain.  Derm: Negative for rash or itching.  Neuro: Negative for weakness, abnormal sensation, seizure, frequent headaches, memory loss, confusion.  Psych: Negative for anxiety, depression, suicidal ideation, hallucinations.  Endo: Negative for unusual weight change.  Heme: Negative for bruising or bleeding. Allergy: Negative for rash or hives.    Physical  Examination:  BP 129/80  Pulse 107  Temp(Src) 97.4 F (36.3 C) (Oral)  Ht 5' 9" (1.753 m)  Wt 276 lb 6.4 oz (125.374 kg)  BMI 40.80 kg/m2   General: Well-nourished, well-developed in no acute distress.  Head: Normocephalic, atraumatic.   Eyes: Conjunctiva pink, no icterus. Mouth: Oropharyngeal mucosa moist and pink , no lesions erythema or exudate. Neck: Supple without thyromegaly, masses, or lymphadenopathy.  Lungs: Clear to auscultation bilaterally.  Heart: Regular rate and rhythm, no murmurs rubs or gallops.  Abdomen: Bowel sounds are normal, nontender, nondistended, no hepatosplenomegaly or masses, no abdominal bruits or    hernia , no rebound or guarding.  Multiple small incisions from recent laparoscopy. Some subcutaneous edema in the right lower quadrant without erythema or drainage. Cool to touch. Tender at this location.Otherwise soft.  Rectal: deferred Extremities: Trace lower extremity edema. No clubbing or deformities.  Neuro: Alert and oriented x 4 , grossly normal neurologically.  Skin: Warm and dry, no rash or jaundice.   Psych: Alert and cooperative, normal mood and affect.  Labs: Lab Results  Component Value Date   WBC 9.2 12/09/2013   HGB 14.9 12/09/2013   HCT 43.7 12/09/2013   MCV 83.9 12/09/2013   PLT 254 12/09/2013   Lab Results  Component Value Date   CREATININE 0.95 12/09/2013   BUN 13 12/09/2013   NA 137 12/09/2013   K 4.4 12/09/2013   CL 99 12/09/2013   CO2 28 12/09/2013   No results found for this basename: ALT, AST, GGT, ALKPHOS, BILITOT     Imaging Studies: No results found.    

## 2013-12-28 NOTE — Patient Instructions (Signed)
1. Start Prilosec 20mg  one capsule 30 minutes before breakfast daily for GERD. You can use Zantac for first week on Prilosec until it kicks in. 2. Please call us as soon as you know when you are cleared by Dr. Arnoldo Morale for a colonoscopy/upper endoscopy.

## 2014-01-12 ENCOUNTER — Telehealth: Payer: Self-pay | Admitting: Gastroenterology

## 2014-01-12 NOTE — Telephone Encounter (Signed)
Please follow up on this guy. We were waiting to here if cleared by Dr. Arnoldo Morale for TCS/EGD s/p hernia repair.

## 2014-01-13 ENCOUNTER — Other Ambulatory Visit: Payer: Self-pay | Admitting: Gastroenterology

## 2014-01-13 ENCOUNTER — Telehealth: Payer: Self-pay | Admitting: Internal Medicine

## 2014-01-13 DIAGNOSIS — K219 Gastro-esophageal reflux disease without esophagitis: Secondary | ICD-10-CM

## 2014-01-13 DIAGNOSIS — K625 Hemorrhage of anus and rectum: Secondary | ICD-10-CM

## 2014-01-13 MED ORDER — PEG-KCL-NACL-NASULF-NA ASC-C 100 G PO SOLR: 1.0000 | ORAL | Status: DC

## 2014-01-13 NOTE — Telephone Encounter (Signed)
Spoke with pt- he went to see Dr. Arnoldo Morale today and was cleared for tcs/egd.   Manuela Schwartz, can you get ov note from Dr. Arnoldo Morale? Serena Colonel, pt said he can do procedures anytime- see ov note.

## 2014-01-13 NOTE — Telephone Encounter (Signed)
Tried to call pt- LMOM 

## 2014-01-13 NOTE — Telephone Encounter (Signed)
Mr. Weedon is scheduled with [facility] on Thursday June 18th and I have mailed instructions to him

## 2014-01-13 NOTE — Telephone Encounter (Signed)
Pt was returning a call to the nurse. Please call him back at [phone]

## 2014-01-13 NOTE — Telephone Encounter (Signed)
Spoke with pt

## 2014-01-16 ENCOUNTER — Telehealth: Payer: Self-pay | Admitting: Internal Medicine

## 2014-01-16 NOTE — Telephone Encounter (Signed)
Forwarded to CS to retrieved records from Dr Adline Mango office

## 2014-01-16 NOTE — Telephone Encounter (Signed)
Marylou Mccoy, LPN at 4/94/4967 5:91 PM     Status: Signed        Spoke with pt- he went to see Dr. Arnoldo Morale today and was cleared for tcs/egd.  Manuela Schwartz, can you get ov note from Dr. Arnoldo Morale?  Serena Colonel, pt said he can do procedures anytime- see ov note.        Marylou Mccoy, LPN at 6/38/4665 9:93 PM     Status: Signed        Tried to call pt- LMOM        Mahala Menghini, PA-C at 01/12/2014 1:23 PM     Status: Signed        Please follow up on this guy. We were waiting to here if cleared by Dr. Arnoldo Morale for TCS/EGD s/p hernia repair

## 2014-01-17 ENCOUNTER — Encounter (HOSPITAL_COMMUNITY): Payer: Self-pay

## 2014-01-18 ENCOUNTER — Other Ambulatory Visit: Payer: Self-pay | Admitting: Internal Medicine

## 2014-01-18 MED ORDER — PEG-KCL-NACL-NASULF-NA ASC-C 100 G PO SOLR: 1.0000 | ORAL | Status: DC

## 2014-01-18 NOTE — Telephone Encounter (Signed)
Did we get the records.

## 2014-01-19 NOTE — Telephone Encounter (Signed)
I called Dr. Arnoldo Morale office to get records they are closed until 01/24/14.

## 2014-01-20 ENCOUNTER — Encounter (HOSPITAL_COMMUNITY): Payer: Self-pay

## 2014-01-20 ENCOUNTER — Encounter (HOSPITAL_COMMUNITY)
Admission: RE | Disposition: A | Discharge status: Home / Self Care | Payer: Self-pay | Source: Ambulatory Visit | Attending: Internal Medicine

## 2014-01-20 ENCOUNTER — Ambulatory Visit (HOSPITAL_COMMUNITY)
Admission: RE | Admit: 2014-01-20 | Discharge: 2014-01-20 | Disposition: A | Discharge status: Home / Self Care | Payer: Medicaid Other | Source: Ambulatory Visit | Attending: Internal Medicine | Admitting: Internal Medicine

## 2014-01-20 DIAGNOSIS — K648 Other hemorrhoids: Secondary | ICD-10-CM | POA: Insufficient documentation

## 2014-01-20 DIAGNOSIS — K229 Disease of esophagus, unspecified: Secondary | ICD-10-CM | POA: Diagnosis not present

## 2014-01-20 DIAGNOSIS — K625 Hemorrhage of anus and rectum: Secondary | ICD-10-CM

## 2014-01-20 DIAGNOSIS — R933 Abnormal findings on diagnostic imaging of other parts of digestive tract: Secondary | ICD-10-CM

## 2014-01-20 DIAGNOSIS — K2270 Barrett's esophagus without dysplasia: Secondary | ICD-10-CM | POA: Insufficient documentation

## 2014-01-20 DIAGNOSIS — F17200 Nicotine dependence, unspecified, uncomplicated: Secondary | ICD-10-CM | POA: Insufficient documentation

## 2014-01-20 DIAGNOSIS — Z79899 Other long term (current) drug therapy: Secondary | ICD-10-CM | POA: Insufficient documentation

## 2014-01-20 DIAGNOSIS — Z860100 Personal history of colon polyps, unspecified: Secondary | ICD-10-CM | POA: Diagnosis not present

## 2014-01-20 DIAGNOSIS — K2940 Chronic atrophic gastritis without bleeding: Secondary | ICD-10-CM | POA: Insufficient documentation

## 2014-01-20 DIAGNOSIS — K921 Melena: Secondary | ICD-10-CM | POA: Insufficient documentation

## 2014-01-20 DIAGNOSIS — D126 Benign neoplasm of colon, unspecified: Secondary | ICD-10-CM | POA: Insufficient documentation

## 2014-01-20 DIAGNOSIS — K219 Gastro-esophageal reflux disease without esophagitis: Secondary | ICD-10-CM

## 2014-01-20 DIAGNOSIS — I10 Essential (primary) hypertension: Secondary | ICD-10-CM | POA: Insufficient documentation

## 2014-01-20 DIAGNOSIS — K449 Diaphragmatic hernia without obstruction or gangrene: Secondary | ICD-10-CM | POA: Insufficient documentation

## 2014-01-20 HISTORY — PX: COLONOSCOPY: SHX5424

## 2014-01-20 HISTORY — PX: ESOPHAGOGASTRODUODENOSCOPY: SHX5428

## 2014-01-20 SURGERY — COLONOSCOPY
Anesthesia: Moderate Sedation

## 2014-01-20 MED ORDER — LIDOCAINE VISCOUS 2 % MT SOLN
OROMUCOSAL | Status: DC | PRN
  Administered 2014-01-20 (×2): 2 mL via OROMUCOSAL

## 2014-01-20 MED ORDER — PROMETHAZINE HCL 25 MG/ML IJ SOLN
INTRAMUSCULAR | Status: AC
  Filled 2014-01-20: qty 1

## 2014-01-20 MED ORDER — ONDANSETRON HCL 4 MG/2ML IJ SOLN
INTRAMUSCULAR | Status: DC
Start: 2014-01-20 — End: 2014-01-20
  Filled 2014-01-20: qty 2

## 2014-01-20 MED ORDER — SODIUM CHLORIDE 0.9 % IJ SOLN
INTRAMUSCULAR | Status: DC
Start: 2014-01-20 — End: 2014-01-20
  Filled 2014-01-20: qty 10

## 2014-01-20 MED ORDER — PROMETHAZINE HCL 25 MG/ML IJ SOLN
INTRAMUSCULAR | Status: DC | PRN
  Administered 2014-01-20: 12.5 mg via INTRAVENOUS

## 2014-01-20 MED ORDER — MEPERIDINE HCL 100 MG/ML IJ SOLN
INTRAMUSCULAR | Status: AC
  Filled 2014-01-20: qty 2

## 2014-01-20 MED ORDER — MEPERIDINE HCL 100 MG/ML IJ SOLN
INTRAMUSCULAR | Status: AC
  Filled 2014-01-20: qty 1

## 2014-01-20 MED ORDER — SODIUM CHLORIDE 0.9 % IV SOLN
INTRAVENOUS | Status: DC
  Administered 2014-01-20: 13:00:00 via INTRAVENOUS

## 2014-01-20 MED ORDER — SIMETHICONE 40 MG/0.6ML PO SUSP
ORAL | Status: DC | PRN
  Administered 2014-01-20: 14:00:00

## 2014-01-20 MED ORDER — MIDAZOLAM HCL 5 MG/5ML IJ SOLN
INTRAMUSCULAR | Status: AC
  Filled 2014-01-20: qty 5

## 2014-01-20 MED ORDER — MIDAZOLAM HCL 5 MG/5ML IJ SOLN
INTRAMUSCULAR | Status: DC | PRN
  Administered 2014-01-20 (×6): 2 mg via INTRAVENOUS

## 2014-01-20 MED ORDER — LIDOCAINE VISCOUS 2 % MT SOLN
OROMUCOSAL | Status: AC
  Filled 2014-01-20: qty 15

## 2014-01-20 MED ORDER — MIDAZOLAM HCL 5 MG/5ML IJ SOLN
INTRAMUSCULAR | Status: DC
Start: 2014-01-20 — End: 2014-01-20
  Filled 2014-01-20: qty 10

## 2014-01-20 MED ORDER — MEPERIDINE HCL 100 MG/ML IJ SOLN
INTRAMUSCULAR | Status: DC | PRN
  Administered 2014-01-20: 50 mg via INTRAVENOUS
  Administered 2014-01-20: 25 mg via INTRAVENOUS
  Administered 2014-01-20: 50 mg via INTRAVENOUS
  Administered 2014-01-20: 25 mg via INTRAVENOUS
  Administered 2014-01-20: 50 mg via INTRAVENOUS

## 2014-01-20 NOTE — Op Note (Signed)
[facility] [address] [place], [address]   ENDOSCOPY PROCEDURE REPORT  PATIENT: Douglas Dorsey, Douglas Dorsey  MR#: [id number] BIRTHDATE: 1976-02-19 , 37  yrs. old GENDER: Male ENDOSCOPIST: R.  Garfield Cornea, MD Quentin Ore REFERRED BY:  [facility] PROCEDURE DATE:  01/20/2014 PROCEDURE:     EGD with gastric and esophageal biopsy  INDICATIONS:    Chronic GERD; recently much improved with Prilosec  INFORMED CONSENT:   The risks, benefits, limitations, alternatives and imponderables have been discussed.  The potential for biopsy, esophogeal dilation, etc. have also been reviewed.  Questions have been answered.  All parties agreeable.  Please see the history and physical in the medical record for more information.  MEDICATIONS: Versed 8 mg IV and Demerol 150 mg IV in divided doses. Zofran 4 mg IV. Phenergan 12.5 mg IV.  DESCRIPTION OF PROCEDURE:   The EG-[id number] ([id number])  endoscope was introduced through the mouth and advanced to the second portion of the duodenum without difficulty or limitations.  The mucosal surfaces were surveyed very carefully during advancement of the scope and upon withdrawal.  Retroflexion view of the proximal stomach and esophagogastric junction was performed.      FINDINGS: Patulous EG junction. To "tongues" of salmon-colored epithelium coming up a 2 cm above the GE junction on opposite walls. No esophagitis. No tumor. Stomach empty. 3 cm hiatal hernia some excoriation and superficial erosions the antrum and body. No ulcer or infiltrating process. Patent pylorus. Normal first and second portion of the duodenum  THERAPEUTIC / DIAGNOSTIC MANEUVERS PERFORMED:   COMPLICATIONS:  None  IMPRESSION:   Patulous EG junction abnormal distal esophagus - query short segment Barrett's - status post esophageal biopsy. Hiatal hernia. Abnormal gastric mucosa-status post gastric biopsy  RECOMMENDATIONS:  Followup on pathology.    Continue Prilosec 20 mg daily.   See EGD report.    _______________________________ R. Garfield Cornea, MD FACP [facility] eSigned:  R. Garfield Cornea, MD FACP [facility] 01/20/2014 2:35 PM     CC:

## 2014-01-20 NOTE — H&P (View-Only) (Signed)
Primary Care Physician:  Dionisio Paschal, NP  Primary Gastroenterologist:  Garfield Cornea, MD   Chief Complaint  Patient presents with  . Rectal Bleeding    HPI:  Douglas Dorsey is a 38 y.o. male here for further evaluation of rectal bleeding and history of heme positive stool per request of Rivka Safer, NP with [facility]. Had incisional hernia repair by Dr. Aviva Signs on 12/17/13.   Referred last year but was not able to keep appointment. Intermittent rectal bleeding going on for over one year. Used to seem to be related to ibuprofen. Now every day without being on NSAIDs. Usually BMs very regular. Brbrp with clots at times. No abdominal pain or rectal pain. Bad acid reflux. Trying Zantac, helps some. Throws up at night due to reflux. TUMS, one bottle every two days. No dysphagia. + Regurgitation. Feels lump in throat. GERD for over 10 years. Never been on a PPI. No prior upper endoscopy or colonoscopy. No family history of colon cancer or IBD. No ASA/NSAIDs.  Current Outpatient Prescriptions  Medication Sig Dispense Refill  . oxyCODONE-acetaminophen (PERCOCET) 7.5-325 MG per tablet Take 1 tablet by mouth every 4 (four) hours as needed for pain.      . ranitidine (ZANTAC) 150 MG tablet Take 150 mg by mouth 2 (two) times daily.       No current facility-administered medications for this visit.    Allergies as of 12/28/2013  . (No Known Allergies)    Past Medical History  Diagnosis Date  . Tobacco abuse   . Family history of coronary artery disease 12/24/2011  . Hypertension   . Complication of anesthesia     pt sts "they told me it took longer for them to wake me up than it should have".  . GERD (gastroesophageal reflux disease)     Past Surgical History  Procedure Laterality Date  . Hernia repair      Umbilical  . Teeth extracted      12 teeth removed in [place]  . Incisional hernia repair N/A 12/17/2013    Procedure: LAPAROSCOPIC INCISIONAL HERNIA  REPAIR WITH MESH;  Surgeon: Jamesetta So, MD;  Location: [facility];  Service: General;  Laterality: N/A;  . Insertion of mesh N/A 12/17/2013    Procedure: INSERTION OF MESH;  Surgeon: Jamesetta So, MD;  Location: [facility];  Service: General;  Laterality: N/A;    Family History  Problem Relation Age of Onset  . Diabetes Mother   . Hypertension Mother   . Heart failure Father   . Diabetes Brother   . Hypertension Brother   . Lung disease      History   Social History  . Marital Status: Married    Spouse Name: N/A    Number of Children: N/A  . Years of Education: N/A   Occupational History  . Not on file.   Social History Main Topics  . Smoking status: Current Every Day Smoker -- 2.00 packs/day for 25 years    Types: Cigarettes  . Smokeless tobacco: Not on file  . Alcohol Use: Yes     Comment: seldom  . Drug Use: No  . Sexual Activity: Yes    Birth Control/ Protection: None   Other Topics Concern  . Not on file   Social History Narrative  . No narrative on file      ROS:  General: Negative for anorexia, weight loss, fever, chills, fatigue, weakness. Eyes: Negative for vision changes.  ENT: Negative for hoarseness, difficulty swallowing , nasal congestion. CV: Negative for chest pain, angina, palpitations, dyspnea on exertion, peripheral edema.  Respiratory: Negative for dyspnea at rest, dyspnea on exertion, cough, sputum, wheezing.  GI: See history of present illness. GU:  Negative for dysuria, hematuria, urinary incontinence, urinary frequency, nocturnal urination.  MS: Negative for joint pain, low back pain.  Derm: Negative for rash or itching.  Neuro: Negative for weakness, abnormal sensation, seizure, frequent headaches, memory loss, confusion.  Psych: Negative for anxiety, depression, suicidal ideation, hallucinations.  Endo: Negative for unusual weight change.  Heme: Negative for bruising or bleeding. Allergy: Negative for rash or hives.    Physical  Examination:  BP 129/80  Pulse 107  Temp(Src) 97.4 F (36.3 C) (Oral)  Ht 5\' 9"  (1.753 m)  Wt 276 lb 6.4 oz (125.374 kg)  BMI 40.80 kg/m2   General: Well-nourished, well-developed in no acute distress.  Head: Normocephalic, atraumatic.   Eyes: Conjunctiva pink, no icterus. Mouth: Oropharyngeal mucosa moist and pink , no lesions erythema or exudate. Neck: Supple without thyromegaly, masses, or lymphadenopathy.  Lungs: Clear to auscultation bilaterally.  Heart: Regular rate and rhythm, no murmurs rubs or gallops.  Abdomen: Bowel sounds are normal, nontender, nondistended, no hepatosplenomegaly or masses, no abdominal bruits or    hernia , no rebound or guarding.  Multiple small incisions from recent laparoscopy. Some subcutaneous edema in the right lower quadrant without erythema or drainage. Cool to touch. Tender at this location.Otherwise soft.  Rectal: deferred Extremities: Trace lower extremity edema. No clubbing or deformities.  Neuro: Alert and oriented x 4 , grossly normal neurologically.  Skin: Warm and dry, no rash or jaundice.   Psych: Alert and cooperative, normal mood and affect.  Labs: Lab Results  Component Value Date   WBC 9.2 12/09/2013   HGB 14.9 12/09/2013   HCT 43.7 12/09/2013   MCV 83.9 12/09/2013   PLT 254 12/09/2013   Lab Results  Component Value Date   CREATININE 0.95 12/09/2013   BUN 13 12/09/2013   NA 137 12/09/2013   K 4.4 12/09/2013   CL 99 12/09/2013   CO2 28 12/09/2013   No results found for this basename: ALT, AST, GGT, ALKPHOS, BILITOT     Imaging Studies: No results found.

## 2014-01-20 NOTE — Interval H&P Note (Signed)
History and Physical Interval Note:  01/20/2014 2:01 PM  Douglas Dorsey  has presented today for surgery, with the diagnosis of RECTAL BLEED  AND CHRONIC GERD  The various methods of treatment have been discussed with the patient and family. After consideration of risks, benefits and other options for treatment, the patient has consented to  Procedure(s) with comments: COLONOSCOPY (N/A) - 2:00 ESOPHAGOGASTRODUODENOSCOPY (EGD) (N/A) as a surgical intervention .  The patient's history has been reviewed, patient examined, no change in status, stable for surgery.  I have reviewed the patient's chart and labs.  Questions were answered to the patient's satisfaction.    Prilosec helping GERD symptoms quite a bit per patient. EGD and colonoscopy per plan.  The risks, benefits, limitations, imponderables and alternatives regarding both EGD and colonoscopy have been reviewed with the patient. Questions have been answered. All parties agreeable.    Manus Rudd

## 2014-01-20 NOTE — Discharge Instructions (Addendum)
Colonoscopy Discharge Instructions  Read the instructions outlined below and refer to this sheet in the next few weeks. These discharge instructions provide you with general information on caring for yourself after you leave the hospital. Your doctor may also give you specific instructions. While your treatment has been planned according to the most current medical practices available, unavoidable complications occasionally occur. If you have any problems or questions after discharge, call Dr. Gala Romney at [phone]. ACTIVITY  You may resume your regular activity, but move at a slower pace for the next 24 hours.   Take frequent rest periods for the next 24 hours.   Walking will help get rid of the air and reduce the bloated feeling in your belly (abdomen).   No driving for 24 hours (because of the medicine (anesthesia) used during the test).    Do not sign any important legal documents or operate any machinery for 24 hours (because of the anesthesia used during the test).  NUTRITION  Drink plenty of fluids.   You may resume your normal diet as instructed by your doctor.   Begin with a light meal and progress to your normal diet. Heavy or fried foods are harder to digest and may make you feel sick to your stomach (nauseated).   Avoid alcoholic beverages for 24 hours or as instructed.  MEDICATIONS  You may resume your normal medications unless your doctor tells you otherwise.  WHAT YOU CAN EXPECT TODAY  Some feelings of bloating in the abdomen.   Passage of more gas than usual.   Spotting of blood in your stool or on the toilet paper.  IF YOU HAD POLYPS REMOVED DURING THE COLONOSCOPY:  No aspirin products for 7 days or as instructed.   No alcohol for 7 days or as instructed.   Eat a soft diet for the next 24 hours.  FINDING OUT THE RESULTS OF YOUR TEST Not all test results are available during your visit. If your test results are not back during the visit, make an appointment  with your caregiver to find out the results. Do not assume everything is normal if you have not heard from your caregiver or the medical facility. It is important for you to follow up on all of your test results.  SEEK IMMEDIATE MEDICAL ATTENTION IF:  You have more than a spotting of blood in your stool.   Your belly is swollen (abdominal distention).   You are nauseated or vomiting.   You have a temperature over 101.   You have abdominal pain or discomfort that is severe or gets worse throughout the day.   EGD Discharge instructions Please read the instructions outlined below and refer to this sheet in the next few weeks. These discharge instructions provide you with general information on caring for yourself after you leave the hospital. Your doctor may also give you specific instructions. While your treatment has been planned according to the most current medical practices available, unavoidable complications occasionally occur. If you have any problems or questions after discharge, please call your doctor. ACTIVITY  You may resume your regular activity but move at a slower pace for the next 24 hours.   Take frequent rest periods for the next 24 hours.   Walking will help expel (get rid of) the air and reduce the bloated feeling in your abdomen.   No driving for 24 hours (because of the anesthesia (medicine) used during the test).   You may shower.   Do not sign any  important legal documents or operate any machinery for 24 hours (because of the anesthesia used during the test).  NUTRITION  Drink plenty of fluids.   You may resume your normal diet.   Begin with a light meal and progress to your normal diet.   Avoid alcoholic beverages for 24 hours or as instructed by your caregiver.  MEDICATIONS  You may resume your normal medications unless your caregiver tells you otherwise.  WHAT YOU CAN EXPECT TODAY  You may experience abdominal discomfort such as a feeling of  fullness or gas pains.  FOLLOW-UP  Your doctor will discuss the results of your test with you.  SEEK IMMEDIATE MEDICAL ATTENTION IF ANY OF THE FOLLOWING OCCUR:  Excessive nausea (feeling sick to your stomach) and/or vomiting.   Severe abdominal pain and distention (swelling).   Trouble swallowing.   Temperature over 101 F (37.8 C).   Rectal bleeding or vomiting of blood.    GERD and hemorrhoid information provided  Continue Prilosec 20 mg daily  Benefiber 2 teaspoons twice daily  Further recommendations to follow pending review of pathology report  Gastroesophageal Reflux Disease, Adult Gastroesophageal reflux disease (GERD) happens when acid from your stomach flows up into the esophagus. When acid comes in contact with the esophagus, the acid causes soreness (inflammation) in the esophagus. Over time, GERD may create small holes (ulcers) in the lining of the esophagus. CAUSES   Increased body weight. This puts pressure on the stomach, making acid rise from the stomach into the esophagus.  Smoking. This increases acid production in the stomach.  Drinking alcohol. This causes decreased pressure in the lower esophageal sphincter (valve or ring of muscle between the esophagus and stomach), allowing acid from the stomach into the esophagus.  Late evening meals and a full stomach. This increases pressure and acid production in the stomach.  A malformed lower esophageal sphincter. Sometimes, no cause is found. SYMPTOMS   Burning pain in the lower part of the mid-chest behind the breastbone and in the mid-stomach area. This may occur twice a week or more often.  Trouble swallowing.  Sore throat.  Dry cough.  Asthma-like symptoms including chest tightness, shortness of breath, or wheezing. DIAGNOSIS  Your caregiver may be able to diagnose GERD based on your symptoms. In some cases, X-rays and other tests may be done to check for complications or to check the condition  of your stomach and esophagus. TREATMENT  Your caregiver may recommend over-the-counter or prescription medicines to help decrease acid production. Ask your caregiver before starting or adding any new medicines.  HOME CARE INSTRUCTIONS   Change the factors that you can control. Ask your caregiver for guidance concerning weight loss, quitting smoking, and alcohol consumption.  Avoid foods and drinks that make your symptoms worse, such as:  Caffeine or alcoholic drinks.  Chocolate.  Peppermint or mint flavorings.  Garlic and onions.  Spicy foods.  Citrus fruits, such as oranges, lemons, or limes.  Tomato-based foods such as sauce, chili, salsa, and pizza.  Fried and fatty foods.  Avoid lying down for the 3 hours prior to your bedtime or prior to taking a nap.  Eat small, frequent meals instead of large meals.  Wear loose-fitting clothing. Do not wear anything tight around your waist that causes pressure on your stomach.  Raise the head of your bed 6 to 8 inches with wood blocks to help you sleep. Extra pillows will not help.  Only take over-the-counter or prescription medicines for pain, discomfort,  or fever as directed by your caregiver.  Do not take aspirin, ibuprofen, or other nonsteroidal anti-inflammatory drugs (NSAIDs). SEEK IMMEDIATE MEDICAL CARE IF:   You have pain in your arms, neck, jaw, teeth, or back.  Your pain increases or changes in intensity or duration.  You develop nausea, vomiting, or sweating (diaphoresis).  You develop shortness of breath, or you faint.  Your vomit is green, yellow, black, or looks like coffee grounds or blood.  Your stool is red, bloody, or black. These symptoms could be signs of other problems, such as heart disease, gastric bleeding, or esophageal bleeding. MAKE SURE YOU:   Understand these instructions.  Will watch your condition.  Will get help right away if you are not doing well or get  worse.   Hemorrhoids Hemorrhoids are swollen veins around the rectum or anus. There are two types of hemorrhoids:   Internal hemorrhoids. These occur in the veins just inside the rectum. They may poke through to the outside and become irritated and painful.  External hemorrhoids. These occur in the veins outside the anus and can be felt as a painful swelling or hard lump near the anus. CAUSES  Pregnancy.   Obesity.   Constipation or diarrhea.   Straining to have a bowel movement.   Sitting for long periods on the toilet.  Heavy lifting or other activity that caused you to strain.  Anal intercourse. SYMPTOMS   Pain.   Anal itching or irritation.   Rectal bleeding.   Fecal leakage.   Anal swelling.   One or more lumps around the anus.  DIAGNOSIS  Your caregiver may be able to diagnose hemorrhoids by visual examination. Other examinations or tests that may be performed include:   Examination of the rectal area with a gloved hand (digital rectal exam).   Examination of anal canal using a small tube (scope).   A blood test if you have lost a significant amount of blood.  A test to look inside the colon (sigmoidoscopy or colonoscopy). TREATMENT Most hemorrhoids can be treated at home. However, if symptoms do not seem to be getting better or if you have a lot of rectal bleeding, your caregiver may perform a procedure to help make the hemorrhoids get smaller or remove them completely. Possible treatments include:   Placing a rubber band at the base of the hemorrhoid to cut off the circulation (rubber band ligation).   Injecting a chemical to shrink the hemorrhoid (sclerotherapy).   Using a tool to burn the hemorrhoid (infrared light therapy).   Surgically removing the hemorrhoid (hemorrhoidectomy).   Stapling the hemorrhoid to block blood flow to the tissue (hemorrhoid stapling).  HOME CARE INSTRUCTIONS   Eat foods with fiber, such as whole  grains, beans, nuts, fruits, and vegetables. Ask your doctor about taking products with added fiber in them (fibersupplements).  Increase fluid intake. Drink enough water and fluids to keep your urine clear or pale yellow.   Exercise regularly.   Go to the bathroom when you have the urge to have a bowel movement. Do not wait.   Avoid straining to have bowel movements.   Keep the anal area dry and clean. Use wet toilet paper or moist towelettes after a bowel movement.   Medicated creams and suppositories may be used or applied as directed.   Only take over-the-counter or prescription medicines as directed by your caregiver.   Take warm sitz baths for 15-20 minutes, 3-4 times a day to ease pain and discomfort.  Place ice packs on the hemorrhoids if they are tender and swollen. Using ice packs between sitz baths may be helpful.   Put ice in a plastic bag.   Place a towel between your skin and the bag.   Leave the ice on for 15-20 minutes, 3-4 times a day.   Do not use a donut-shaped pillow or sit on the toilet for long periods. This increases blood pooling and pain.  SEEK MEDICAL CARE IF:  You have increasing pain and swelling that is not controlled by treatment or medicine.  You have uncontrolled bleeding.  You have difficulty or you are unable to have a bowel movement.  You have pain or inflammation outside the area of the hemorrhoids. MAKE SURE YOU:  Understand these instructions.  Will watch your condition.  Will get help right away if you are not doing well or get worse.

## 2014-01-20 NOTE — Op Note (Signed)
[facility] [address] [place], [address]   COLONOSCOPY PROCEDURE REPORT  PATIENT: Douglas Dorsey, Douglas Dorsey  MR#:         [id number] BIRTHDATE: Oct 29, 1975 , 37  yrs. old GENDER: Male ENDOSCOPIST: R.  Garfield Cornea, MD Quentin Ore REFERRED BY:  [place] PROCEDURE DATE:  01/20/2014 PROCEDURE:     Colonoscopy with biopsy  INDICATIONS: hematochezia  INFORMED CONSENT:  The risks, benefits, alternatives and imponderables including but not limited to bleeding, perforation as well as the possibility of a missed lesion have been reviewed.  The potential for biopsy, lesion removal, etc. have also been discussed.  Questions have been answered.  All parties agreeable. Please see the history and physical in the medical record for more information.  MEDICATIONS: Versed 12 mg IV and Demerol 200 mg IV in divided doses. Phenergan 12.5 mg IV and Zofran 4 mg IV.  DESCRIPTION OF PROCEDURE:  After a digital rectal exam was performed, the EC-[id number] ([id number])  colonoscope was advanced from the anus through the rectum and colon to the area of the cecum, ileocecal valve and appendiceal orifice.  The cecum was deeply intubated.  These structures were well-seen and photographed for the record.  From the level of the cecum and ileocecal valve, the scope was slowly and cautiously withdrawn.  The mucosal surfaces were carefully surveyed utilizing scope tip deflection to facilitate fold flattening as needed.  The scope was pulled down into the rectum where a thorough examination including retroflexion was performed.    FINDINGS:  Inadequate prep on the right side of the colon. Friable anal canal hemorrhoids; otherwise, normal rectum. (2) diminutive descending colon polyp; otherwise, the remainder of the colonic mucosa appeared grossly normal. Examination of the ascending colon mucosa compromised by relatively poor prep.  THERAPEUTIC / DIAGNOSTIC MANEUVERS PERFORMED:   The 2 diminutive were polyps cold biopsied/removed.  COMPLICATIONS: None  CECAL WITHDRAWAL TIME:  10 minutes  IMPRESSION:  Colonic polyps - removed as described above. Friable anal canal hemorrhoids -   likely source of hematochezia.  RECOMMENDATIONS: Begin Benefiber 2 teaspoons twice daily. Course of Anusol suppositories.  Followup on pathology.  See EGD report.    _______________________________ eSigned:  R. Garfield Cornea, MD FACP [facility] 01/20/2014 3:01 PM   CC:

## 2014-01-21 ENCOUNTER — Telehealth: Payer: Self-pay | Admitting: Internal Medicine

## 2014-01-21 NOTE — Telephone Encounter (Signed)
Pt had procedure yesterday and said that his insurance does not cover the suppository prescription that RMR gave him. Is there something else he can use? Pleas advise. [facility] in [place]. [phone]

## 2014-01-24 NOTE — Telephone Encounter (Signed)
Patient called in again to follow-up on his suppository Rx.    Routing to Gordon.

## 2014-01-24 NOTE — Telephone Encounter (Signed)
Lets find out which one?

## 2014-01-24 NOTE — Telephone Encounter (Signed)
Pts insurance doesn't cover anusol suppositories. Do you want to change him to something different?  Most insurances have rectal creams on their formularies.

## 2014-01-25 NOTE — Telephone Encounter (Signed)
Requested OV note from Dr. Arnoldo Morale.

## 2014-01-25 NOTE — Telephone Encounter (Signed)
Records are in Leslie's cart.

## 2014-01-25 NOTE — Telephone Encounter (Signed)
Checked medicaid formulary, no suppositories or rectal creams on the list. Called [facility], spoke with pharmacist, she could not tell me what was available to Medicaid pts. Tried to call [place] medicaid, could not get thru to a person. Will try to call them back again tomorrow.

## 2014-01-26 ENCOUNTER — Encounter: Payer: Self-pay | Admitting: Internal Medicine

## 2014-01-28 ENCOUNTER — Encounter (HOSPITAL_COMMUNITY): Payer: Self-pay | Admitting: Internal Medicine

## 2014-02-01 NOTE — Telephone Encounter (Signed)
Douglas Males, do you know of any rectal creams that medicaid will pay for?

## 2014-02-02 MED ORDER — HYDROCORTISONE 2.5 % RE CREA: 1.0000 "application " | TOPICAL_CREAM | Freq: Two times a day (BID) | RECTAL | Status: DC

## 2014-02-02 NOTE — Telephone Encounter (Signed)
Yes, Medicaid doesn't cover the suppositories. They should cover the cream, though. I will send in a generic.

## 2014-02-02 NOTE — Addendum Note (Signed)
Addended by: Orvil Feil on: 02/02/2014 08:52 AM   Modules accepted: Orders

## 2014-04-08 ENCOUNTER — Emergency Department (HOSPITAL_COMMUNITY)
Admission: EM | Admit: 2014-04-08 | Discharge: 2014-04-08 | Disposition: A | Discharge status: Home / Self Care | Payer: Medicaid Other | Attending: Emergency Medicine | Admitting: Emergency Medicine

## 2014-04-08 ENCOUNTER — Encounter (HOSPITAL_COMMUNITY): Payer: Self-pay | Admitting: Emergency Medicine

## 2014-04-08 ENCOUNTER — Emergency Department (HOSPITAL_COMMUNITY): Payer: Medicaid Other

## 2014-04-08 DIAGNOSIS — F17200 Nicotine dependence, unspecified, uncomplicated: Secondary | ICD-10-CM | POA: Insufficient documentation

## 2014-04-08 DIAGNOSIS — Z79899 Other long term (current) drug therapy: Secondary | ICD-10-CM | POA: Insufficient documentation

## 2014-04-08 DIAGNOSIS — I10 Essential (primary) hypertension: Secondary | ICD-10-CM | POA: Insufficient documentation

## 2014-04-08 DIAGNOSIS — K219 Gastro-esophageal reflux disease without esophagitis: Secondary | ICD-10-CM | POA: Insufficient documentation

## 2014-04-08 DIAGNOSIS — M7731 Calcaneal spur, right foot: Secondary | ICD-10-CM

## 2014-04-08 DIAGNOSIS — M79609 Pain in unspecified limb: Secondary | ICD-10-CM | POA: Insufficient documentation

## 2014-04-08 DIAGNOSIS — M722 Plantar fascial fibromatosis: Secondary | ICD-10-CM | POA: Insufficient documentation

## 2014-04-08 MED ORDER — OXYCODONE-ACETAMINOPHEN 5-325 MG PO TABS: 1.0000 | ORAL_TABLET | ORAL | Status: DC | PRN

## 2014-04-08 MED ORDER — IBUPROFEN 800 MG PO TABS: 800.0000 mg | ORAL_TABLET | Freq: Three times a day (TID) | ORAL | Status: DC

## 2014-04-08 NOTE — ED Notes (Signed)
Patient complaining of right foot pain and swelling x 3 days. Denies injury.

## 2014-04-08 NOTE — Discharge Instructions (Signed)
Plantar Fasciitis °Plantar fasciitis is a common condition that causes foot pain. It is soreness (inflammation) of the band of tough fibrous tissue on the bottom of the foot that runs from the heel bone (calcaneus) to the ball of the foot. The cause of this soreness may be from excessive standing, poor fitting shoes, running on hard surfaces, being overweight, having an abnormal walk, or overuse (this is common in runners) of the painful foot or feet. It is also common in aerobic exercise dancers and ballet dancers. °SYMPTOMS  °Most people with plantar fasciitis complain of: °· Severe pain in the morning on the bottom of their foot especially when taking the first steps out of bed. This pain recedes after a few minutes of walking. °· Severe pain is experienced also during walking following a long period of inactivity. °· Pain is worse when walking barefoot or up stairs °DIAGNOSIS  °· Your caregiver will diagnose this condition by examining and feeling your foot. °· Special tests such as X-rays of your foot, are usually not needed. °PREVENTION  °· Consult a sports medicine professional before beginning a new exercise program. °· Walking programs offer a good workout. With walking there is a lower chance of overuse injuries common to runners. There is less impact and less jarring of the joints. °· Begin all new exercise programs slowly. If problems or pain develop, decrease the amount of time or distance until you are at a comfortable level. °· Wear good shoes and replace them regularly. °· Stretch your foot and the heel cords at the back of the ankle (Achilles tendon) both before and after exercise. °· Run or exercise on even surfaces that are not hard. For example, asphalt is better than pavement. °· Do not run barefoot on hard surfaces. °· If using a treadmill, vary the incline. °· Do not continue to workout if you have foot or joint problems. Seek professional help if they do not improve. °HOME CARE INSTRUCTIONS    °· Avoid activities that cause you pain until you recover. °· Use ice or cold packs on the problem or painful areas after working out. °· Only take over-the-counter or prescription medicines for pain, discomfort, or fever as directed by your caregiver. °· Soft shoe inserts or athletic shoes with air or gel sole cushions may be helpful. °· If problems continue or become more severe, consult a sports medicine caregiver or your own health care provider. Cortisone is a potent anti-inflammatory medication that may be injected into the painful area. You can discuss this treatment with your caregiver. °MAKE SURE YOU:  °· Understand these instructions. °· Will watch your condition. °· Will get help right away if you are not doing well or get worse. °Document Released: 04/16/2001 Document Revised: 10/14/2011 Document Reviewed: 06/15/2008 °Exit[employer]® Patient Information ©2015 [employer]. This information is not intended to replace advice given to you by your health care provider. Make sure you discuss any questions you have with your health care provider. ° °Heel Spur °A heel spur is a hook of bone that can form on the calcaneus (the heel bone and the largest bone of the foot). Heel spurs are often associated with plantar fasciitis and usually come in people who have had the problem for an extended period of time. The cause of the relationship is unknown. The pain associated with them is thought to be caused by an inflammation (soreness and redness) of the plantar fascia rather than the spur itself. The plantar fascia is a thick   fibrous like tissue that runs from the calcaneus (heel bone) to the ball of the foot. This strong, tight tissue helps maintain the arch of your foot. It helps distribute the weight across your foot as you walk or run. Stresses placed on the plantar fascia can be tremendous. When it is inflamed normal activities become painful. Pain is worse in the morning after sleeping. After sleeping the plantar  fascia is tight. The first movements stretch the fascia and this causes pain. As the tendon loosens, the pain usually gets better. It often returns with too much standing or walking.  °About 70% of patients with plantar fasciitis have a heel spur. About half of people without foot pain also have heel spurs. °DIAGNOSIS  °The diagnosis of a heel spur is made by X-ray. The X-ray shows a hook of bone protruding from the bottom of the calcaneus at the point where the plantar fascia is attached to the heel bone.  °TREATMENT °· It is necessary to find out what is causing the stretching of the plantar fascia. If the cause is over-pronation (flat feet), orthotics and proper foot ware may help. °· Stretching exercises, losing weight, wearing shoes that have a cushioned heel that absorbs shock, and elevating the heel with the use of a heel cradle, heel cup, or orthotics may all help. Heel cradles and heel cups provide extra comfort and cushion to the heel, and reduce the amount of shock to the sore area. °AVOIDING THE PAIN OF PLANTAR FASCIITIS AND HEEL SPURS °· Consult a sports medicine professional before beginning a new exercise program. °· Walking programs offer a good workout. There is a lower chance of overuse injuries common to the runners. There is less impact and less jarring of the joints. °· Begin all new exercise programs slowly. If problems or pains develop, decrease the amount of time or distance until you are at a comfortable level. °· Wear good shoes and replace them regularly. °· Stretch your foot and the heel cords at the back of the ankle (Achilles tendons) both before and after exercise. °· Run or exercise on even surfaces that are not hard. For example, asphalt is better than pavement. °· Do not run barefoot on hard surfaces. °· If using a treadmill, vary the incline. °· Do not continue to workout if you have foot or joint problems. Seek professional help if they do not improve. °HOME CARE INSTRUCTIONS   °· Avoid activities that cause you pain until you recover. °· Use ice or cold packs to the problem or painful areas after working out. °· Only take over-the-counter or prescription medicines for pain, discomfort, or fever as directed by your caregiver. °· Soft shoe inserts or athletic shoes with air or gel sole cushions may be helpful. °· If problems continue or become more severe, consult a sports medicine caregiver. Cortisone is a potent anti-inflammatory medication that may be injected into the painful area. You can discuss this treatment with your caregiver. °MAKE SURE YOU:  °· Understand these instructions. °· Will watch your condition. °· Will get help right away if you are not doing well or get worse. °Document Released: 08/28/2005 Document Revised: 10/14/2011 Document Reviewed: 09/22/2013 °ExitCare® Patient Information ©2015 [employer]. This information is not intended to replace advice given to you by your health care provider. Make sure you discuss any questions you have with your health care provider. ° °

## 2014-04-08 NOTE — ED Provider Notes (Signed)
Medical screening examination/treatment/procedure(s) were performed by non-physician practitioner and as supervising physician I was immediately available for consultation/collaboration.   EKG Interpretation None        Maudry Diego, MD 04/08/14 [phone]

## 2014-04-08 NOTE — ED Provider Notes (Signed)
CSN: [id number]     Arrival date & time 04/08/14  1275 History   First MD Initiated Contact with Patient 04/08/14 0920     Chief Complaint  Patient presents with  . Foot Pain   Douglas Dorsey is a 38 y.o. male who presents to the Emergency Department complaining of pain and swelling of the right hind foot for 3 days. He states pain is worse with weightbearing and improves with rest. Patient denies injury but does state that he stands and walks on hard floors all day at his job.  He denies rash, fever, chills, or discoloration to his feet.  He also denies pain or swelling proximal to his foot.  (Consider location/radiation/quality/duration/timing/severity/associated sxs/prior Treatment) Patient is a 38 y.o. male presenting with lower extremity pain. The history is provided by the patient.  Foot Pain This is a new problem. The problem occurs constantly. The problem has been unchanged. Associated symptoms include arthralgias and joint swelling. Pertinent negatives include no chills, fever, headaches, numbness, rash, sore throat, vertigo, vomiting or weakness. The symptoms are aggravated by standing and walking. He has tried nothing for the symptoms. The treatment provided no relief.    Past Medical History  Diagnosis Date  . Tobacco abuse   . Family history of coronary artery disease 12/24/2011  . Hypertension   . Complication of anesthesia     pt sts "they told me it took longer for them to wake me up than it should have".  . GERD (gastroesophageal reflux disease)    Past Surgical History  Procedure Laterality Date  . Hernia repair      Umbilical  . Teeth extracted      12 teeth removed in [place]  . Incisional hernia repair N/A 12/17/2013    Procedure: LAPAROSCOPIC INCISIONAL HERNIA REPAIR WITH MESH;  Surgeon: Jamesetta So, MD;  Location: [facility];  Service: General;  Laterality: N/A;  . Insertion of mesh N/A 12/17/2013    Procedure: INSERTION OF MESH;  Surgeon: Jamesetta So, MD;   Location: [facility];  Service: General;  Laterality: N/A;  . Colonoscopy N/A 01/20/2014    Procedure: COLONOSCOPY;  Surgeon: Daneil Dolin, MD;  Location: [facility];  Service: Endoscopy;  Laterality: N/A;  2:00  . Esophagogastroduodenoscopy N/A 01/20/2014    Procedure: ESOPHAGOGASTRODUODENOSCOPY (EGD);  Surgeon: Daneil Dolin, MD;  Location: [facility];  Service: Endoscopy;  Laterality: N/A;   Family History  Problem Relation Age of Onset  . Diabetes Mother   . Hypertension Mother   . Heart failure Father   . Diabetes Brother   . Hypertension Brother   . Lung disease    . Colon cancer Neg Hx   . Inflammatory bowel disease Neg Hx    History  Substance Use Topics  . Smoking status: Current Every Day Smoker -- 2.00 packs/day for 25 years    Types: Cigarettes  . Smokeless tobacco: Not on file  . Alcohol Use: Yes     Comment: seldom    Review of Systems  Constitutional: Negative for fever and chills.  HENT: Negative for sore throat.   Gastrointestinal: Negative for vomiting.  Genitourinary: Negative for dysuria and difficulty urinating.  Musculoskeletal: Positive for arthralgias and joint swelling.  Skin: Negative for color change, rash and wound.  Neurological: Negative for vertigo, weakness, numbness and headaches.  All other systems reviewed and are negative.     Allergies  Review of patient's allergies indicates no known allergies.  Home  Medications   Prior to Admission medications   Medication Sig Start Date End Date Taking? Authorizing Provider  hydrocortisone (ANUSOL-HC) 2.5 % rectal cream Place 1 application rectally 2 (two) times daily. 02/02/14   Orvil Feil, NP  ibuprofen (ADVIL,MOTRIN) 800 MG tablet Take 1 tablet (800 mg total) by mouth 3 (three) times daily. Take with food 04/08/14   Morayo Leven L. Kregg Cihlar, PA-C  omeprazole (PRILOSEC) 20 MG capsule Take 1 capsule (20 mg total) by mouth daily. 12/28/13   Mahala Menghini, PA-C  oxyCODONE-acetaminophen  (PERCOCET/ROXICET) 5-325 MG per tablet Take 1 tablet by mouth every 4 (four) hours as needed. 04/08/14   Aneesa Romey L. Shandra Szymborski, PA-C  peg 3350 powder (MOVIPREP) 100 G SOLR Take 1 kit (200 g total) by mouth as directed. 01/18/14   Daneil Dolin, MD   BP 150/97  Pulse 91  Temp(Src) 98.7 F (37.1 C) (Oral)  Resp 16  Ht '5\' 9"'  (1.753 m)  Wt 275 lb (124.739 kg)  BMI 40.59 kg/m2  SpO2 99% Physical Exam  Nursing note and vitals reviewed. Constitutional: [name] oriented to person, place, and time. He appears well-developed and well-nourished. No distress.  HENT:  Head: Normocephalic and atraumatic.  Cardiovascular: Normal rate, regular rhythm, normal heart sounds and intact distal pulses.   No murmur heard. Pulmonary/Chest: Effort normal and breath sounds normal.  Musculoskeletal: Normal range of motion. He exhibits edema and tenderness.   ttp of the hind foot and plantar surface of the heel. Mild STS is present.  ROM is preserved.  DP pulse is brisk,distal sensation intact.  No erythema, open wounds, bruising or bony deformity.  No proximal tenderness.  Compartments of the right lower extremity are soft  Neurological: [name] alert and oriented to person, place, and time. He exhibits normal muscle tone. Coordination normal.  Skin: Skin is warm and dry. No erythema.    ED Course  Procedures (including critical care time) Labs Review Labs Reviewed - No data to display  Imaging Review Dg Ankle Complete Right  04/08/2014   CLINICAL DATA:  3-4 day history of right ankle pain and swelling  EXAM: RIGHT ANKLE - COMPLETE 3+ VIEW  COMPARISON:  Ankle series of February 01, 2012  FINDINGS: There is diffuse soft tissue swelling. The ankle joint mortise is preserved. The talar dome is intact. There is no acute malleolar fracture. The talus and calcaneus exhibit no acute fractures. There are plantar and Achilles region calcaneal spurs. There are no lytic or blastic bony lesions. There are no soft tissue gas collections.   IMPRESSION: There is diffuse soft tissue swelling. There is no acute or significant chronic bony abnormality of the ankle.   Electronically Signed   By: David  Martinique   On: 04/08/2014 09:37   Dg Foot Complete Right  04/08/2014   CLINICAL DATA:  Foot pain and swelling for 3-4 days  EXAM: RIGHT FOOT COMPLETE - 3+ VIEW  COMPARISON:  Ankle series of today's date  FINDINGS: The bones of the foot are adequately mineralized. There is no acute fracture nor dislocation. There are plantar and Achilles region calcaneal spurs. Mild soft tissue swelling over the midfoot is suspected.  IMPRESSION: There is no acute bony abnormality of the right foot.   Electronically Signed   By: David  Martinique   On: 04/08/2014 09:36     EKG Interpretation None      MDM   Final diagnoses:  Calcaneal spur of foot, right  Plantar fasciitis of right foot  Patient is well appearing. Ambulatory, NV intact.  Agrees to symptomatic treatment with ibuprofen, ice, and percocet for pain.  Referral given for podiatry.      Zharia Conrow L. Jsaon Yoo, PA-C 04/08/14 1012

## 2014-06-08 ENCOUNTER — Encounter: Payer: Self-pay | Admitting: Orthopedic Surgery

## 2014-06-23 ENCOUNTER — Ambulatory Visit (INDEPENDENT_AMBULATORY_CARE_PROVIDER_SITE_OTHER): Payer: Medicaid Other | Admitting: Orthopedic Surgery

## 2014-06-23 ENCOUNTER — Encounter: Payer: Self-pay | Admitting: Orthopedic Surgery

## 2014-06-23 VITALS — BP 144/92 | Ht 69.0 in | Wt 275.0 lb

## 2014-06-23 DIAGNOSIS — M722 Plantar fascial fibromatosis: Secondary | ICD-10-CM

## 2014-06-23 NOTE — Progress Notes (Signed)
Patient ID: Douglas Dorsey, male   DOB: 1976-05-09, 38 y.o.   MRN: [id number] Patient ID: Douglas Dorsey, male   DOB: [birth year], 38 y.o.   MRN: [id number]  Chief Complaint  Patient presents with  . Foot Pain    right heel pain, no known injury, referred by [facility]    HPI Douglas Dorsey is a 38 y.o. male.  HPI 38 year old male presents with 2 year history plantar heel pain. He complains of throbbing stabbing aching pain on the plantar aspect of his foot associated with some posterior pain near the Achilles and rates his pain 9 out of 10. Previous treatment includes ibuprofen Tylenol Aleve. His pain is worse with standing for lengthy periods of time and is eased by lying down and nonweightbearing Past Medical History  Diagnosis Date  . Tobacco abuse   . Family history of coronary artery disease 12/24/2011  . Hypertension   . Complication of anesthesia     pt sts "they told me it took longer for them to wake me up than it should have".  . GERD (gastroesophageal reflux disease)     Past Surgical History  Procedure Laterality Date  . Hernia repair      Umbilical  . Teeth extracted      12 teeth removed in [place]  . Incisional hernia repair N/A 12/17/2013    Procedure: LAPAROSCOPIC INCISIONAL HERNIA REPAIR WITH MESH;  Surgeon: Douglas So, MD;  Location: [facility];  Service: General;  Laterality: N/A;  . Insertion of mesh N/A 12/17/2013    Procedure: INSERTION OF MESH;  Surgeon: Douglas So, MD;  Location: [facility];  Service: General;  Laterality: N/A;  . Colonoscopy N/A 01/20/2014    Procedure: COLONOSCOPY;  Surgeon: Douglas Dolin, MD;  [facility];  Service: Endoscopy;  Laterality: N/A;  2:00  . Esophagogastroduodenoscopy N/A 01/20/2014    Procedure: ESOPHAGOGASTRODUODENOSCOPY (EGD);  Surgeon: Douglas Dolin, MD;  [facility];  Service: Endoscopy;  Laterality: N/A;    Family History  Problem Relation Age of Onset  . Diabetes Mother   . Hypertension Mother   . Heart  failure Father   . Diabetes Brother   . Hypertension Brother   . Lung disease    . Colon cancer Neg Hx   . Inflammatory bowel disease Neg Hx     Social History History  Substance Use Topics  . Smoking status: Current Every Day Smoker -- 2.00 packs/day for 25 years    Types: Cigarettes  . Smokeless tobacco: Not on file  . Alcohol Use: Yes     Comment: seldom    No Known Allergies  Current Outpatient Prescriptions  Medication Sig Dispense Refill  . diclofenac sodium (VOLTAREN) 1 % GEL Apply topically.    Marland Kitchen ibuprofen (ADVIL,MOTRIN) 800 MG tablet Take 1 tablet (800 mg total) by mouth 3 (three) times daily. Take with food 21 tablet 0  . omeprazole (PRILOSEC) 20 MG capsule Take 1 capsule (20 mg total) by mouth daily. 30 capsule 11  . hydrocortisone (ANUSOL-HC) 2.5 % rectal cream Place 1 application rectally 2 (two) times daily. 30 g 1  . oxyCODONE-acetaminophen (PERCOCET/ROXICET) 5-325 MG per tablet Take 1 tablet by mouth every 4 (four) hours as needed. 15 tablet 0  . peg 3350 powder (MOVIPREP) 100 G SOLR Take 1 kit (200 g total) by mouth as directed. 1 kit 0   No current facility-administered medications for this visit.    Review of  Systems Review of Systems Cough, back pain, burning pain in his legs with numbness blood in his stools Blood pressure 144/92, height '5\' 9"'  (1.753 m), weight 275 lb (124.739 kg).  Physical Exam Physical Exam Patient walks and ambulating with a limp. He has tenderness mild plantar aspect of the foot mild Achilles tendon with palpable spur Achilles tendon. Ankle range of motion remains intact ankle stability normal muscle tone normal skin multiple areas of callus formation and poor nail care normal pulse normal sensation mood is normal is oriented 3 his appearance is normal his vital signs are stable Data Reviewed Plain films show no fracture he has an Achilles tendon spur and plantar spur is well  Assessment    Plantar fasciitis    Plan     Injection Heel cups NSAIDs Exercises at home Patient has Medicaid cannot receive braces from the office. I would've preferred a Cam Walker for him. Or night splint.       Douglas Dorsey 06/23/2014, 8:54 AM

## 2014-06-23 NOTE — Patient Instructions (Signed)
Home exercises and heel inserts

## 2014-07-11 ENCOUNTER — Emergency Department (HOSPITAL_COMMUNITY)
Admission: EM | Admit: 2014-07-11 | Discharge: 2014-07-11 | Disposition: A | Discharge status: Home / Self Care | Payer: Medicaid Other | Attending: Emergency Medicine | Admitting: Emergency Medicine

## 2014-07-11 ENCOUNTER — Encounter (HOSPITAL_COMMUNITY): Payer: Self-pay | Admitting: Cardiology

## 2014-07-11 DIAGNOSIS — K219 Gastro-esophageal reflux disease without esophagitis: Secondary | ICD-10-CM | POA: Insufficient documentation

## 2014-07-11 DIAGNOSIS — Z720 Tobacco use: Secondary | ICD-10-CM | POA: Diagnosis not present

## 2014-07-11 DIAGNOSIS — R05 Cough: Secondary | ICD-10-CM | POA: Diagnosis present

## 2014-07-11 DIAGNOSIS — R109 Unspecified abdominal pain: Secondary | ICD-10-CM | POA: Diagnosis not present

## 2014-07-11 DIAGNOSIS — Z791 Long term (current) use of non-steroidal anti-inflammatories (NSAID): Secondary | ICD-10-CM | POA: Insufficient documentation

## 2014-07-11 DIAGNOSIS — Z7952 Long term (current) use of systemic steroids: Secondary | ICD-10-CM | POA: Insufficient documentation

## 2014-07-11 DIAGNOSIS — Z79899 Other long term (current) drug therapy: Secondary | ICD-10-CM | POA: Diagnosis not present

## 2014-07-11 DIAGNOSIS — J3489 Other specified disorders of nose and nasal sinuses: Secondary | ICD-10-CM | POA: Insufficient documentation

## 2014-07-11 DIAGNOSIS — I10 Essential (primary) hypertension: Secondary | ICD-10-CM | POA: Diagnosis not present

## 2014-07-11 DIAGNOSIS — R51 Headache: Secondary | ICD-10-CM | POA: Diagnosis not present

## 2014-07-11 DIAGNOSIS — R059 Cough, unspecified: Secondary | ICD-10-CM

## 2014-07-11 DIAGNOSIS — R062 Wheezing: Secondary | ICD-10-CM | POA: Insufficient documentation

## 2014-07-11 MED ORDER — BENZONATATE 100 MG PO CAPS: 200.0000 mg | ORAL_CAPSULE | Freq: Three times a day (TID) | ORAL | Status: DC | PRN

## 2014-07-11 MED ORDER — PROMETHAZINE-CODEINE 6.25-10 MG/5ML PO SYRP: 5.0000 mL | ORAL_SOLUTION | ORAL | Status: DC | PRN

## 2014-07-11 MED ORDER — ALBUTEROL SULFATE HFA 108 (90 BASE) MCG/ACT IN AERS: 1.0000 | INHALATION_SPRAY | Freq: Four times a day (QID) | RESPIRATORY_TRACT | Status: DC | PRN

## 2014-07-11 NOTE — ED Notes (Addendum)
Cough times 2 weeks.  Lung sounds clear in triage.

## 2014-07-11 NOTE — Discharge Instructions (Signed)
Cough, Adult   A cough is a reflex. It helps you clear your throat and airways. A cough can help heal your body. A cough can last 2 or 3 weeks (acute) or may last more than 8 weeks (chronic). Some common causes of a cough can include an infection, allergy, or a cold.  HOME CARE  · Only take medicine as told by your doctor.  · If given, take your medicines (antibiotics) as told. Finish them even if you start to feel better.  · Use a cold steam vaporizer or humidifier in your home. This can help loosen thick spit (secretions).  · Sleep so you are almost sitting up (semi-upright). Use pillows to do this. This helps reduce coughing.  · Rest as needed.  · Stop smoking if you smoke.  GET HELP RIGHT AWAY IF:  · You have yellowish-white fluid (pus) in your thick spit.  · Your cough gets worse.  · Your medicine does not reduce coughing, and you are losing sleep.  · You cough up blood.  · You have trouble breathing.  · Your pain gets worse and medicine does not help.  · You have a fever.  MAKE SURE YOU:   · Understand these instructions.  · Will watch your condition.  · Will get help right away if you are not doing well or get worse.  Document Released: 04/04/2011 Document Revised: 12/06/2013 Document Reviewed: 04/04/2011  Exit[employer]® Patient Information ©2015 [employer]. This information is not intended to replace advice given to you by your health care provider. Make sure you discuss any questions you have with your health care provider.

## 2014-07-11 NOTE — ED Provider Notes (Signed)
CSN: [id number]     Arrival date & time 07/11/14  1540 History  This chart was scribed for non-physician practitioner Evalee Jefferson, PA-C working with Johnna Acosta, MD by Zola Button, ED Scribe. This patient was seen in room [facility] and the patient's care was started at [place] PM.    Chief Complaint  Patient presents with  . Cough    The history is provided by the patient. No language interpreter was used.   HPI Comments: Douglas Dorsey is a 38 y.o. male who presents to the Emergency Department complaining of gradual onset cough that began about 2 weeks ago. He states that his cough is sometimes productive of clear sputum mostly in the morning. Patient also reports mild wheezing, rhinorrhea, abdominal muscle soreness when he coughs. Patient states the cough is triggered by a tickle sensation in his throat that is worse when he wakes up in the morning. He has tried Nyquil, Dayquil, Robitussin and Benadryl without relief. He denies SOB, fever, congestion, CP and postnasal drip. [name] a 1.5-2ppd smoker but has been smoking less recently because it exacerbates his symptoms; he has intentions to quit. He states his GERD is under control. Patient works as a Training and development officer at [employer]. He has used an inhaler before for prior episodes of bronchitis.  Past Medical History  Diagnosis Date  . Tobacco abuse   . Family history of coronary artery disease 12/24/2011  . Hypertension   . Complication of anesthesia     pt sts "they told me it took longer for them to wake me up than it should have".  . GERD (gastroesophageal reflux disease)    Past Surgical History  Procedure Laterality Date  . Hernia repair      Umbilical  . Teeth extracted      12 teeth removed in [place]  . Incisional hernia repair N/A 12/17/2013    Procedure: LAPAROSCOPIC INCISIONAL HERNIA REPAIR WITH MESH;  Surgeon: Jamesetta So, MD;  Location: [facility];  Service: General;  Laterality: N/A;  . Insertion of mesh N/A 12/17/2013    Procedure:  INSERTION OF MESH;  Surgeon: Jamesetta So, MD;  Location: [facility];  Service: General;  Laterality: N/A;  . Colonoscopy N/A 01/20/2014    Procedure: COLONOSCOPY;  Surgeon: Daneil Dolin, MD;  [facility];  Service: Endoscopy;  Laterality: N/A;  2:00  . Esophagogastroduodenoscopy N/A 01/20/2014    Procedure: ESOPHAGOGASTRODUODENOSCOPY (EGD);  Surgeon: Daneil Dolin, MD;  [facility];  Service: Endoscopy;  Laterality: N/A;   Family History  Problem Relation Age of Onset  . Diabetes Mother   . Hypertension Mother   . Heart failure Father   . Diabetes Brother   . Hypertension Brother   . Lung disease    . Colon cancer Neg Hx   . Inflammatory bowel disease Neg Hx    History  Substance Use Topics  . Smoking status: Current Every Day Smoker -- 2.00 packs/day for 25 years    Types: Cigarettes  . Smokeless tobacco: Not on file  . Alcohol Use: Yes     Comment: seldom    Review of Systems  Constitutional: Negative for fever.  HENT: Positive for rhinorrhea. Negative for congestion, ear pain, postnasal drip, sinus pressure, trouble swallowing and voice change.   Eyes: Negative for discharge.  Respiratory: Positive for cough and wheezing. Negative for chest tightness, shortness of breath and stridor.   Cardiovascular: Negative for chest pain.  Gastrointestinal: Positive for  abdominal pain.  Genitourinary: Negative.   Neurological: Positive for headaches.      Allergies  Review of patient's allergies indicates no known allergies.  Home Medications   Prior to Admission medications   Medication Sig Start Date End Date Taking? Authorizing Provider  albuterol (PROVENTIL HFA;VENTOLIN HFA) 108 (90 BASE) MCG/ACT inhaler Inhale 1-2 puffs into the lungs every 6 (six) hours as needed for wheezing or shortness of breath. 07/11/14   Evalee Jefferson, PA-C  benzonatate (TESSALON) 100 MG capsule Take 2 capsules (200 mg total) by mouth 3 (three) times daily as needed for cough.  07/11/14   Evalee Jefferson, PA-C  diclofenac sodium (VOLTAREN) 1 % GEL Apply topically.    Historical Provider, MD  hydrocortisone (ANUSOL-HC) 2.5 % rectal cream Place 1 application rectally 2 (two) times daily. 02/02/14   Orvil Feil, NP  ibuprofen (ADVIL,MOTRIN) 800 MG tablet Take 1 tablet (800 mg total) by mouth 3 (three) times daily. Take with food 04/08/14   Tammy L. Triplett, PA-C  omeprazole (PRILOSEC) 20 MG capsule Take 1 capsule (20 mg total) by mouth daily. 12/28/13   Mahala Menghini, PA-C  oxyCODONE-acetaminophen (PERCOCET/ROXICET) 5-325 MG per tablet Take 1 tablet by mouth every 4 (four) hours as needed. 04/08/14   Tammy L. Triplett, PA-C  peg 3350 powder (MOVIPREP) 100 G SOLR Take 1 kit (200 g total) by mouth as directed. 01/18/14   Daneil Dolin, MD  promethazine-codeine (PHENERGAN WITH CODEINE) 6.25-10 MG/5ML syrup Take 5 mLs by mouth every 4 (four) hours as needed for cough. 07/11/14   Evalee Jefferson, PA-C   BP 132/80 mmHg  Pulse 106  Temp(Src) 98.6 F (37 C) (Oral)  Resp 18  Ht '5\' 9"'  (1.753 m)  Wt 286 lb (129.729 kg)  BMI 42.22 kg/m2  SpO2 98% Physical Exam  Constitutional: [name] oriented to person, place, and time. He appears well-developed and well-nourished.  HENT:  Head: Normocephalic and atraumatic.  Right Ear: Tympanic membrane and ear canal normal.  Left Ear: Tympanic membrane and ear canal normal.  Nose: Mucosal edema and rhinorrhea present.  Mouth/Throat: Uvula is midline, oropharynx is clear and moist and mucous membranes are normal. No oropharyngeal exudate, posterior oropharyngeal edema, posterior oropharyngeal erythema or tonsillar abscesses.  Eyes: Conjunctivae are normal.  Cardiovascular: Regular rhythm and normal heart sounds.   Borderline tachy  Pulmonary/Chest: Effort normal. No respiratory distress. He has no decreased breath sounds. He has no wheezes. He has no rhonchi. He has no rales.  Musculoskeletal: Normal range of motion.  Neurological: [name] alert and oriented  to person, place, and time.  Skin: Skin is warm and dry. No rash noted.  Psychiatric: He has a normal mood and affect.  Nursing note and vitals reviewed.   ED Course  Procedures  DIAGNOSTIC STUDIES: Oxygen Saturation is 96% on room air, adequate by my interpretation.    COORDINATION OF CARE: 5:11 PM-Discussed treatment plan which includes medications with pt at bedside and pt agreed to plan.   Labs Review Labs Reviewed - No data to display  Imaging Review No results found.   EKG Interpretation None      MDM   Final diagnoses:  Cough    Exam normal, no wheezing or stridor, intermittent dry cough during exam.  Pt was advised he needs to stop smoking.  Denies cp, no sob. No indication for cxr today.  He was prescribed phenergan/codeine and tessalon, also prescribed albuterol given history of intermittent wheeze, just none on exam  today. Advised prn f/u with pcp.  I personally performed the services described in this documentation, which was scribed in my presence. The recorded information has been reviewed and is accurate.   Evalee Jefferson, PA-C 07/12/14 1359  Johnna Acosta, MD 07/12/14 [phone]

## 2014-07-11 NOTE — ED Notes (Signed)
Pt states he is always tachycardic.

## 2014-08-05 DIAGNOSIS — K2270 Barrett's esophagus without dysplasia: Secondary | ICD-10-CM

## 2014-08-05 HISTORY — DX: Barrett's esophagus without dysplasia: K22.70

## 2014-11-05 ENCOUNTER — Encounter (HOSPITAL_COMMUNITY): Payer: Self-pay

## 2014-11-05 ENCOUNTER — Emergency Department (HOSPITAL_COMMUNITY)
Admission: EM | Admit: 2014-11-05 | Discharge: 2014-11-05 | Disposition: A | Discharge status: Home / Self Care | Payer: Medicaid Other | Attending: Emergency Medicine | Admitting: Emergency Medicine

## 2014-11-05 DIAGNOSIS — I10 Essential (primary) hypertension: Secondary | ICD-10-CM | POA: Diagnosis not present

## 2014-11-05 DIAGNOSIS — Z79899 Other long term (current) drug therapy: Secondary | ICD-10-CM | POA: Insufficient documentation

## 2014-11-05 DIAGNOSIS — Z791 Long term (current) use of non-steroidal anti-inflammatories (NSAID): Secondary | ICD-10-CM | POA: Diagnosis not present

## 2014-11-05 DIAGNOSIS — R000 Tachycardia, unspecified: Secondary | ICD-10-CM | POA: Diagnosis not present

## 2014-11-05 DIAGNOSIS — Z7952 Long term (current) use of systemic steroids: Secondary | ICD-10-CM | POA: Insufficient documentation

## 2014-11-05 DIAGNOSIS — L02411 Cutaneous abscess of right axilla: Secondary | ICD-10-CM | POA: Diagnosis not present

## 2014-11-05 DIAGNOSIS — Z720 Tobacco use: Secondary | ICD-10-CM | POA: Insufficient documentation

## 2014-11-05 DIAGNOSIS — K219 Gastro-esophageal reflux disease without esophagitis: Secondary | ICD-10-CM | POA: Diagnosis not present

## 2014-11-05 MED ORDER — POVIDONE-IODINE 10 % EX SOLN
CUTANEOUS | Status: AC
  Filled 2014-11-05: qty 118

## 2014-11-05 MED ORDER — IBUPROFEN 800 MG PO TABS
ORAL_TABLET | ORAL | Status: AC
  Administered 2014-11-05: 800 mg via ORAL
  Filled 2014-11-05: qty 1

## 2014-11-05 MED ORDER — LIDOCAINE HCL (PF) 1 % IJ SOLN
INTRAMUSCULAR | Status: AC
  Filled 2014-11-05: qty 5

## 2014-11-05 MED ORDER — LIDOCAINE HCL (PF) 2 % IJ SOLN: 10.0000 mL | Freq: Once | INTRAMUSCULAR | Status: DC

## 2014-11-05 MED ORDER — SULFAMETHOXAZOLE-TRIMETHOPRIM 800-160 MG PO TABS: 1.0000 | ORAL_TABLET | Freq: Two times a day (BID) | ORAL | Status: AC

## 2014-11-05 MED ORDER — IBUPROFEN 800 MG PO TABS
800.0000 mg | ORAL_TABLET | Freq: Once | ORAL | Status: AC
  Administered 2014-11-05: 800 mg via ORAL

## 2014-11-05 NOTE — Discharge Instructions (Signed)
Return in 2 days for wound check and packing removal. Return sooner for any problems.

## 2014-11-05 NOTE — ED Notes (Signed)
Patient c/o of abscess in his right axilla. Painfull, warm to touch. Patient has history of abscess there, but normally self resolving.

## 2014-11-05 NOTE — ED Provider Notes (Signed)
CSN: [id number]     Arrival date & time 11/05/14  1506 History   First MD Initiated Contact with Patient 11/05/14 1523     Chief Complaint  Patient presents with  . Abscess     (Consider location/radiation/quality/duration/timing/severity/associated sxs/prior Treatment) Patient is a 39 y.o. male presenting with abscess. The history is provided by the patient.  Abscess Abscess quality: fluctuance, painful, redness and warmth   Red streaking: no   Duration:  1 week Progression:  Worsening Pain details:    Quality:  Sharp and aching   Severity:  Moderate   Duration:  1 week   Timing:  Constant   Progression:  Worsening Chronicity:  New Relieved by:  Nothing Worsened by:  Nothing tried Ineffective treatments:  Draining/squeezing Risk factors: prior abscess    Douglas Dorsey is a 39 y.o. male who presents to the ED with pain and swelling to the right axilla that started a bout a week ago. He states that he has had similar areas in the past but they usually drain on their own. This one has gotten larger but has not drained.   Past Medical History  Diagnosis Date  . Tobacco abuse   . Family history of coronary artery disease 12/24/2011  . Hypertension   . Complication of anesthesia     pt sts "they told me it took longer for them to wake me up than it should have".  . GERD (gastroesophageal reflux disease)    Past Surgical History  Procedure Laterality Date  . Hernia repair      Umbilical  . Teeth extracted      12 teeth removed in [place]  . Incisional hernia repair N/A 12/17/2013    Procedure: LAPAROSCOPIC INCISIONAL HERNIA REPAIR WITH MESH;  Surgeon: Jamesetta So, MD;  Location: [facility];  Service: General;  Laterality: N/A;  . Insertion of mesh N/A 12/17/2013    Procedure: INSERTION OF MESH;  Surgeon: Jamesetta So, MD;  Location: [facility];  Service: General;  Laterality: N/A;  . Colonoscopy N/A 01/20/2014    Procedure: COLONOSCOPY;  Surgeon: Daneil Dolin, MD;  Location: [facility];  Service: Endoscopy;  Laterality: N/A;  2:00  . Esophagogastroduodenoscopy N/A 01/20/2014    Procedure: ESOPHAGOGASTRODUODENOSCOPY (EGD);  Surgeon: Daneil Dolin, MD;  Location: [facility];  Service: Endoscopy;  Laterality: N/A;   Family History  Problem Relation Age of Onset  . Diabetes Mother   . Hypertension Mother   . Heart failure Father   . Diabetes Brother   . Hypertension Brother   . Lung disease    . Colon cancer Neg Hx   . Inflammatory bowel disease Neg Hx    History  Substance Use Topics  . Smoking status: Current Every Day Smoker -- 2.00 packs/day for 25 years    Types: Cigarettes  . Smokeless tobacco: Not on file  . Alcohol Use: Yes     Comment: seldom    Review of Systems Negative except as stated in HPI   Allergies  Review of patient's allergies indicates no known allergies.  Home Medications   Prior to Admission medications   Medication Sig Start Date End Date Taking? Authorizing Provider  albuterol (PROVENTIL HFA;VENTOLIN HFA) 108 (90 BASE) MCG/ACT inhaler Inhale 1-2 puffs into the lungs every 6 (six) hours as needed for wheezing or shortness of breath. 07/11/14   Evalee Jefferson, PA-C  benzonatate (TESSALON) 100 MG capsule Take 2 capsules (200 mg total) by mouth  3 (three) times daily as needed for cough. 07/11/14   Evalee Jefferson, PA-C  diclofenac sodium (VOLTAREN) 1 % GEL Apply topically.    Historical Provider, MD  hydrocortisone (ANUSOL-HC) 2.5 % rectal cream Place 1 application rectally 2 (two) times daily. 02/02/14   Orvil Feil, NP  ibuprofen (ADVIL,MOTRIN) 800 MG tablet Take 1 tablet (800 mg total) by mouth 3 (three) times daily. Take with food 04/08/14   Tammi Triplett, PA-C  omeprazole (PRILOSEC) 20 MG capsule Take 1 capsule (20 mg total) by mouth daily. 12/28/13   Mahala Menghini, PA-C  oxyCODONE-acetaminophen (PERCOCET/ROXICET) 5-325 MG per tablet Take 1 tablet by mouth every 4 (four) hours as needed. 04/08/14   Tammi Triplett, PA-C  peg 3350 powder  (MOVIPREP) 100 G SOLR Take 1 kit (200 g total) by mouth as directed. 01/18/14   Daneil Dolin, MD  promethazine-codeine (PHENERGAN WITH CODEINE) 6.25-10 MG/5ML syrup Take 5 mLs by mouth every 4 (four) hours as needed for cough. 07/11/14   Evalee Jefferson, PA-C  sulfamethoxazole-trimethoprim (BACTRIM DS,SEPTRA DS) 800-160 MG per tablet Take 1 tablet by mouth 2 (two) times daily. 11/05/14 11/12/14  Hope Bunnie Pion, NP   BP 153/93 mmHg  Pulse 108  Temp(Src) 99.8 F (37.7 C) (Oral)  Resp 16  Ht 5' 9" (1.753 m)  Wt 286 lb (129.729 kg)  BMI 42.22 kg/m2  SpO2 99% Physical Exam  Constitutional: [name] oriented to person, place, and time. He appears well-developed and well-nourished. No distress.  HENT:  Head: Normocephalic.  Eyes: EOM are normal.  Neck: Neck supple.  Cardiovascular: Tachycardia present.   Pulmonary/Chest: Effort normal.  Musculoskeletal: Normal range of motion.  Right axilla with raised, tender area with erythema.   Neurological: [name] alert and oriented to person, place, and time. No cranial nerve deficit.  Skin: Skin is warm and dry.  Psychiatric: He has a normal mood and affect. His behavior is normal.  Nursing note and vitals reviewed.   ED Course  INCISION AND DRAINAGE Date/Time: 11/05/2014 4:50 PM Performed by: Ashley Murrain Authorized by: Ashley Murrain Consent: Verbal consent obtained. Risks and benefits: risks, benefits and alternatives were discussed Consent given by: patient Patient understanding: patient states understanding of the procedure being performed Patient identity confirmed: verbally with patient Type: abscess Body area: upper extremity (right axilla) Anesthesia: local infiltration Local anesthetic: lidocaine 1% without epinephrine Anesthetic total: 3 ml Patient sedated: no Scalpel size: 11 Needle gauge: 22 Incision type: single straight Complexity: simple Drainage: purulent Drainage amount: moderate Wound treatment: irrigated wound with NSS. Packing  material: 1/2 in iodoform gauze Patient tolerance: Patient tolerated the procedure well with no immediate complications Comments: Dressing applied   (including critical care time) Labs Review  MDM  39 y.o. male with abscess to the right axilla. Stable for d/c without fever or signs of sepsis. He will return in 2 days for recheck and packing removal. Will start Bactrim.   Final diagnoses:  Abscess of right axilla        Ashley Murrain, NP 11/05/14 [place], MD 11/06/14 [phone]

## 2014-11-08 ENCOUNTER — Encounter (HOSPITAL_COMMUNITY): Payer: Self-pay | Admitting: Emergency Medicine

## 2014-11-08 ENCOUNTER — Emergency Department (HOSPITAL_COMMUNITY)
Admission: EM | Admit: 2014-11-08 | Discharge: 2014-11-08 | Disposition: A | Discharge status: Home / Self Care | Payer: Medicaid Other | Attending: Emergency Medicine | Admitting: Emergency Medicine

## 2014-11-08 DIAGNOSIS — Z79899 Other long term (current) drug therapy: Secondary | ICD-10-CM | POA: Insufficient documentation

## 2014-11-08 DIAGNOSIS — L02411 Cutaneous abscess of right axilla: Secondary | ICD-10-CM | POA: Insufficient documentation

## 2014-11-08 DIAGNOSIS — Z792 Long term (current) use of antibiotics: Secondary | ICD-10-CM | POA: Insufficient documentation

## 2014-11-08 DIAGNOSIS — K219 Gastro-esophageal reflux disease without esophagitis: Secondary | ICD-10-CM | POA: Insufficient documentation

## 2014-11-08 DIAGNOSIS — Z4801 Encounter for change or removal of surgical wound dressing: Secondary | ICD-10-CM | POA: Diagnosis present

## 2014-11-08 DIAGNOSIS — I10 Essential (primary) hypertension: Secondary | ICD-10-CM | POA: Insufficient documentation

## 2014-11-08 DIAGNOSIS — Z720 Tobacco use: Secondary | ICD-10-CM | POA: Insufficient documentation

## 2014-11-08 MED ORDER — TRAMADOL HCL 50 MG PO TABS: 50.0000 mg | ORAL_TABLET | Freq: Four times a day (QID) | ORAL | Status: DC | PRN

## 2014-11-08 NOTE — Discharge Instructions (Signed)

## 2014-11-08 NOTE — ED Notes (Signed)
Patient states he was treated here for abscess on Saturday and was told to come back today for recheck.

## 2014-11-08 NOTE — ED Provider Notes (Signed)
CSN: [id number]     Arrival date & time 11/08/14  1810 History   First MD Initiated Contact with Patient 11/08/14 1908     Chief Complaint  Patient presents with  . Wound Check     (Consider location/radiation/quality/duration/timing/severity/associated sxs/prior Treatment) HPI   Douglas Dorsey is a 39 y.o. male who presents to the Emergency Department for recheck of a previous abscess that was incised and drained 3 days ago.  He reports moderate yellow drainage still remains and pain with movement of the right arm.  He states he was advised to take OTC analgesics which, he states have not helped control his pain.  He denies fever, chills, swelling or redness. He also states the packing fell out 1-2 days ago.     Past Medical History  Diagnosis Date  . Tobacco abuse   . Family history of coronary artery disease 12/24/2011  . Hypertension   . Complication of anesthesia     pt sts "they told me it took longer for them to wake me up than it should have".  . GERD (gastroesophageal reflux disease)    Past Surgical History  Procedure Laterality Date  . Hernia repair      Umbilical  . Teeth extracted      12 teeth removed in [place]  . Incisional hernia repair N/A 12/17/2013    Procedure: LAPAROSCOPIC INCISIONAL HERNIA REPAIR WITH MESH;  Surgeon: Jamesetta So, MD;  Location: [facility];  Service: General;  Laterality: N/A;  . Insertion of mesh N/A 12/17/2013    Procedure: INSERTION OF MESH;  Surgeon: Jamesetta So, MD;  Location: [facility];  Service: General;  Laterality: N/A;  . Colonoscopy N/A 01/20/2014    Procedure: COLONOSCOPY;  Surgeon: Daneil Dolin, MD;  [facility];  Service: Endoscopy;  Laterality: N/A;  2:00  . Esophagogastroduodenoscopy N/A 01/20/2014    Procedure: ESOPHAGOGASTRODUODENOSCOPY (EGD);  Surgeon: Daneil Dolin, MD;  Location[facility];  Service: Endoscopy;  Laterality: N/A;   Family History  Problem Relation Age of Onset  . Diabetes Mother   .  Hypertension Mother   . Heart failure Father   . Diabetes Brother   . Hypertension Brother   . Lung disease    . Colon cancer Neg Hx   . Inflammatory bowel disease Neg Hx    History  Substance Use Topics  . Smoking status: Current Every Day Smoker -- 2.00 packs/day for 25 years    Types: Cigarettes  . Smokeless tobacco: Not on file  . Alcohol Use: Yes     Comment: seldom    Review of Systems  Constitutional: Negative for fever and chills.  Gastrointestinal: Negative for nausea and vomiting.  Musculoskeletal: Negative for joint swelling and arthralgias.  Skin: Negative for color change.       Abscess to right axilla  Hematological: Negative for adenopathy.  All other systems reviewed and are negative.     Allergies  Review of patient's allergies indicates no known allergies.  Home Medications   Prior to Admission medications   Medication Sig Start Date End Date Taking? Authorizing Provider  acetaminophen (TYLENOL) 500 MG tablet Take 500 mg by mouth every 6 (six) hours as needed for mild pain or moderate pain.   Yes Historical Provider, MD  albuterol (PROVENTIL HFA;VENTOLIN HFA) 108 (90 BASE) MCG/ACT inhaler Inhale 1-2 puffs into the lungs every 6 (six) hours as needed for wheezing or shortness of breath. 07/11/14  Yes Evalee Jefferson,  PA-C  ibuprofen (ADVIL,MOTRIN) 200 MG tablet Take 800 mg by mouth every 6 (six) hours as needed for mild pain or moderate pain.   Yes Historical Provider, MD  omeprazole (PRILOSEC) 20 MG capsule Take 1 capsule (20 mg total) by mouth daily. 12/28/13  Yes Mahala Menghini, PA-C  sulfamethoxazole-trimethoprim (BACTRIM DS,SEPTRA DS) 800-160 MG per tablet Take 1 tablet by mouth 2 (two) times daily. 11/05/14 11/12/14  Hope Bunnie Pion, NP  traMADol (ULTRAM) 50 MG tablet Take 1 tablet (50 mg total) by mouth every 6 (six) hours as needed. 11/08/14   Tammi Kassadee Carawan, PA-C   BP 140/94 mmHg  Pulse 94  Temp(Src) 98.2 F (36.8 C) (Oral)  Resp 18  Ht 5\' 9"  (1.753 m)  Wt  286 lb (129.729 kg)  BMI 42.22 kg/m2  SpO2 100% Physical Exam  Constitutional: [name] oriented to person, place, and time. He appears well-developed and well-nourished. No distress.  HENT:  Head: Normocephalic and atraumatic.  Cardiovascular: Normal rate, regular rhythm and normal heart sounds.   No murmur heard. Pulmonary/Chest: Effort normal and breath sounds normal. No respiratory distress.  Musculoskeletal: Normal range of motion.  Neurological: [name] alert and oriented to person, place, and time. He exhibits normal muscle tone. Coordination normal.  Skin: Skin is warm and dry. There is erythema.  Small surgical incision site to the right axilla.  Small amt of purulent drainage.  No induration or fluctuance.  No surrounding erythema.    Nursing note and vitals reviewed.   ED Course  Procedures (including critical care time) Labs Review Labs Reviewed - No data to display  Imaging Review No results found.   EKG Interpretation None      MDM   Final diagnoses:  Abscess of axilla, right    Pt returns for recheck of previously drained abscess of the right axilla. Packing not present.  No cellulitis.  NV intact.  Pt currently taking Bactrim and applying warm compresses frequently.  Appears to be healing well.  Pt complains of pain that has not been controlled with Motrin, rx for #12 Ultram and he agrees to continue antibiotics, PMD f/u if needed.   Patrice Paradise, PA-C 11/08/14 2313  Merryl Hacker, MD 11/09/14 [phone]

## 2014-11-10 NOTE — ED Notes (Signed)
Let message for pt to return call regarding right axilla culture.Cutlure done on 11/05/14 was not received in the lab

## 2014-12-19 ENCOUNTER — Encounter: Payer: Self-pay | Admitting: Internal Medicine

## 2015-01-19 ENCOUNTER — Other Ambulatory Visit: Payer: Self-pay

## 2015-01-20 MED ORDER — OMEPRAZOLE 20 MG PO CPDR: 20.0000 mg | DELAYED_RELEASE_CAPSULE | Freq: Every day | ORAL | Status: DC

## 2015-02-14 ENCOUNTER — Other Ambulatory Visit: Payer: Self-pay

## 2015-02-14 ENCOUNTER — Ambulatory Visit (INDEPENDENT_AMBULATORY_CARE_PROVIDER_SITE_OTHER): Payer: Medicaid Other | Admitting: Nurse Practitioner

## 2015-02-14 ENCOUNTER — Encounter: Payer: Self-pay | Admitting: Nurse Practitioner

## 2015-02-14 VITALS — BP 130/84 | HR 93 | Temp 98.0°F | Ht 69.0 in | Wt 269.4 lb

## 2015-02-14 DIAGNOSIS — K2270 Barrett's esophagus without dysplasia: Secondary | ICD-10-CM | POA: Diagnosis not present

## 2015-02-14 DIAGNOSIS — Z86010 Personal history of colonic polyps: Secondary | ICD-10-CM

## 2015-02-14 DIAGNOSIS — Z860101 Personal history of adenomatous and serrated colon polyps: Secondary | ICD-10-CM | POA: Insufficient documentation

## 2015-02-14 MED ORDER — PEG-KCL-NACL-NASULF-NA ASC-C 100 G PO SOLR: 1.0000 | Freq: Once | ORAL | Status: AC

## 2015-02-14 MED ORDER — PEG 3350-KCL-NA BICARB-NACL 420 G PO SOLR: 4000.0000 mL | Freq: Once | ORAL | Status: DC

## 2015-02-14 NOTE — Progress Notes (Signed)
Referring Provider: Dionisio Paschal, NP Primary Care Physician:  Dionisio Paschal, NP Primary GI:  Dr. Gala Romney  Chief Complaint  Patient presents with  . Colonoscopy    HPI:   39 year old male presents to set up repeat endoscopy and colonoscopy. Both procedures last completed on 01/20/2014. Findings on EGD was patulous EG junction with abnormal distal esophagus with short segment Barrett's status post biopsy. 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 Colonoscopy was done for hematochezia which found 2 diminutive polyps in the descending colon, ascending colon mucosa so compromised by a relatively poor prep. Polyps removed found tubular adenoma. Recommended 1 year repeat on both exams for Barrett's surveillance as well as reattempt colonoscopy with adequate prep.  Today he states he's doing ok. Has GERD breakthrough symptoms occasionally based on diet. Continues on Prilosec daily. His GERD symptoms include esophageal burning, bitter taste in his mouth. Admits current abdominal pain although he did have an episode of vomiting a few days ago which was pretty hard. No further N/V since then. Has a bowel movement 1-2 times a day. Stools are loose consistent with [place] 6. Continues to have hematochezia in the toilet. Also with hemorrhoid pain symptoms. Has not been using treatment. Last time he tried Anusol his insurance would only cover topical cream and it was not very effective. Denies melena. Denies chest pain, dyspnea, dizziness, lightheadedness, syncope, near syncope. Denies any other upper or lower GI symptoms.  Past Medical History  Diagnosis Date  . Tobacco abuse   . Family history of coronary artery disease 12/24/2011  . Hypertension   . Complication of anesthesia     pt sts "they told me it took longer for them to wake me up than it should have".  . GERD (gastroesophageal reflux disease)      Past Surgical History  Procedure Laterality Date  . Hernia repair      Umbilical  . Teeth extracted      12 teeth removed in [place]  . Incisional hernia repair N/A 12/17/2013    Procedure: LAPAROSCOPIC INCISIONAL HERNIA REPAIR WITH MESH;  Surgeon: Jamesetta So, MD;  Location: [facility];  Service: General;  Laterality: N/A;  . Insertion of mesh N/A 12/17/2013    Procedure: INSERTION OF MESH;  Surgeon: Jamesetta So, MD;  Location: [facility];  Service: General;  Laterality: N/A;  . Colonoscopy N/A 01/20/2014    RMR: Colonic polyps -removed as descrbied above. Friable anal canal hemorrhoids  . Esophagogastroduodenoscopy N/A 01/20/2014    FOY:DXAJOINO EG junction abnoraml distal esophagus- query  short segment Barretts -status post esophageal biospy. Hiatal hernia. Abnormal gastric mucosa-status post gastric biopsy.     Current Outpatient Prescriptions  Medication Sig Dispense Refill  . albuterol (PROVENTIL HFA;VENTOLIN HFA) 108 (90 BASE) MCG/ACT inhaler Inhale 1-2 puffs into the lungs every 6 (six) hours as needed for wheezing or shortness of breath. 1 Inhaler 0  . omeprazole (PRILOSEC) 20 MG capsule Take 1 capsule (20 mg total) by mouth daily. 30 capsule 11  . acetaminophen (TYLENOL) 500 MG tablet Take 500 mg by mouth every 6 (six) hours as needed for mild pain or moderate pain.    Marland Kitchen ibuprofen (ADVIL,MOTRIN) 200 MG tablet Take 800 mg by mouth every 6 (six) hours as needed for mild pain or moderate pain.    . traMADol (ULTRAM) 50 MG tablet Take 1 tablet (50 mg total) by mouth every 6 (six) hours  as needed. (Patient not taking: Reported on 02/14/2015) 12 tablet 0   No current facility-administered medications for this visit.    Allergies as of 02/14/2015  . (No Known Allergies)    Family History  Problem Relation Age of Onset  . Diabetes Mother   . Hypertension Mother   . Heart failure Father   . Diabetes Brother   . Hypertension Brother   . Lung disease    . Colon cancer Neg Hx    . Inflammatory bowel disease Neg Hx     History   Social History  . Marital Status: Married    Spouse Name: N/A  . Number of Children: N/A  . Years of Education: N/A   Social History Main Topics  . Smoking status: Current Every Day Smoker -- 2.00 packs/day for 25 years    Types: Cigarettes  . Smokeless tobacco: Not on file  . Alcohol Use: Yes     Comment: seldom  . Drug Use: No  . Sexual Activity: Yes    Birth Control/ Protection: None   Other Topics Concern  . None   Social History Narrative    Review of Systems: General: Negative for anorexia, weight loss, fever, chills, fatigue, weakness. Eyes: Negative for vision changes.  ENT: Negative for hoarseness, difficulty swallowing. CV: Negative for chest pain, angina, palpitations, dyspnea on exertion, peripheral edema.  Respiratory: Negative for dyspnea at rest, dyspnea on exertion, cough, sputum, wheezing.  GI: See history of present illness. Endo: Negative for unusual weight change.  Heme: Negative for bruising or bleeding.     Physical Exam: BP 130/84 mmHg  Pulse 93  Temp(Src) 98 F (36.7 C) (Oral)  Ht 5\' 9"  (1.753 m)  Wt 269 lb 6.4 oz (122.199 kg)  BMI 39.77 kg/m2 General:   Alert and oriented. Pleasant and cooperative. Well-nourished and well-developed.  Head:  Normocephalic and atraumatic. Eyes:  Without icterus, sclera clear and conjunctiva pink.  Ears:  Normal auditory acuity. Cardiovascular:  S1, S2 present without murmurs appreciated. Normal pulses noted. Extremities without clubbing or edema. Respiratory:  Clear to auscultation bilaterally. No wheezes, rales, or rhonchi. No distress.  Gastrointestinal:  +BS, soft, and non-distended. Mild generalized abdominal tenderness to palpation. No HSM noted. No guarding or rebound. No masses appreciated.  Rectal:  Deferred  Neurologic:  Alert and oriented x4;  grossly normal neurologically. Psych:  Alert and cooperative. Normal mood and  affect. Heme/Lymph/Immune: No excessive bruising noted.    02/14/2015 9:15 AM

## 2015-02-14 NOTE — Patient Instructions (Signed)
1. Continue taking your acid blocker medicine. 2. Continue your excellent efforts at weight loss. 3. We will schedule your procedures for you. 4. Further recommendations to be based on results of your procedure.

## 2015-02-16 NOTE — Assessment & Plan Note (Signed)
39 year old male diagnosed with Barrett's esophagus in June 2015. Recommend 1 year repeat endoscopy. His GERD is generally well controlled however does have breakthrough symptoms depending on diet. Advised him to avoid foods that trigger breakthrough symptoms. We'll proceed with surveillance endoscopy for Barrett's esophagus.  Proceed with TCS and EGD with 12.5 mg IV phenergan on call with Dr. Gala Romney in near future: the risks, benefits, and alternatives have been discussed with the patient in detail. The patient states understanding and desires to proceed.  She is not on any anticoagulants, antidepressants, anxiolytics, he is on Ultram 50 mg every 6 hours. Last procedure completed with conscious sedation and 12.5 mg IV Phenergan with no noted complications. We'll plan on 12.5 mg IV Phenergan on call and conscious sedation which should be adequate for this procedure.

## 2015-02-16 NOTE — Assessment & Plan Note (Signed)
Patient with colonoscopy in June 2015 for hematochezia which found 2 diminutive polyps in the descending colon however the descending colon mucosa was very compromised by a poor prep. Polyps found to be tubular adenoma and recommended repeat one year surveillance colonoscopy for better prep. Bowel movements are improved in frequency however still loose. Continued hematochezia and hemorrhoid symptoms. We will move forward with the planned surveillance colonoscopy with a split prep and extended 2 day clear liquid time in order to promote better colon prep.  Proceed with TCS and EGD with 12.5 mg IV phenergan on call with Dr. Gala Romney in near future: the risks, benefits, and alternatives have been discussed with the patient in detail. The patient states understanding and desires to proceed.  She is not on any anticoagulants, antidepressants, anxiolytics, he is on Ultram 50 mg every 6 hours. Last procedure completed with conscious sedation and 12.5 mg IV Phenergan with no noted complications. We'll plan on 12.5 mg IV Phenergan on call and conscious sedation which should be adequate for this procedure.

## 2015-02-20 NOTE — Progress Notes (Signed)
cc'ed to pcp °

## 2015-02-22 ENCOUNTER — Ambulatory Visit (HOSPITAL_COMMUNITY)
Admission: RE | Admit: 2015-02-22 | Discharge: 2015-02-22 | Disposition: A | Discharge status: Home / Self Care | Payer: Medicaid Other | Source: Ambulatory Visit | Attending: Internal Medicine | Admitting: Internal Medicine

## 2015-02-22 ENCOUNTER — Encounter (HOSPITAL_COMMUNITY): Payer: Self-pay | Admitting: *Deleted

## 2015-02-22 ENCOUNTER — Telehealth: Payer: Self-pay | Admitting: Internal Medicine

## 2015-02-22 ENCOUNTER — Encounter (HOSPITAL_COMMUNITY)
Admission: RE | Disposition: A | Discharge status: Home / Self Care | Payer: Self-pay | Source: Ambulatory Visit | Attending: Internal Medicine

## 2015-02-22 DIAGNOSIS — I10 Essential (primary) hypertension: Secondary | ICD-10-CM | POA: Insufficient documentation

## 2015-02-22 DIAGNOSIS — K2270 Barrett's esophagus without dysplasia: Secondary | ICD-10-CM | POA: Insufficient documentation

## 2015-02-22 DIAGNOSIS — F17210 Nicotine dependence, cigarettes, uncomplicated: Secondary | ICD-10-CM | POA: Diagnosis not present

## 2015-02-22 DIAGNOSIS — K219 Gastro-esophageal reflux disease without esophagitis: Secondary | ICD-10-CM | POA: Diagnosis not present

## 2015-02-22 DIAGNOSIS — Z7982 Long term (current) use of aspirin: Secondary | ICD-10-CM | POA: Insufficient documentation

## 2015-02-22 DIAGNOSIS — K449 Diaphragmatic hernia without obstruction or gangrene: Secondary | ICD-10-CM | POA: Diagnosis not present

## 2015-02-22 DIAGNOSIS — Q438 Other specified congenital malformations of intestine: Secondary | ICD-10-CM | POA: Insufficient documentation

## 2015-02-22 DIAGNOSIS — Z860100 Personal history of colon polyps, unspecified: Secondary | ICD-10-CM | POA: Insufficient documentation

## 2015-02-22 DIAGNOSIS — Z800 Family history of malignant neoplasm of digestive organs: Secondary | ICD-10-CM | POA: Insufficient documentation

## 2015-02-22 DIAGNOSIS — Z79899 Other long term (current) drug therapy: Secondary | ICD-10-CM | POA: Insufficient documentation

## 2015-02-22 DIAGNOSIS — Z09 Encounter for follow-up examination after completed treatment for conditions other than malignant neoplasm: Secondary | ICD-10-CM | POA: Diagnosis not present

## 2015-02-22 HISTORY — PX: ESOPHAGOGASTRODUODENOSCOPY: SHX5428

## 2015-02-22 HISTORY — PX: COLONOSCOPY: SHX5424

## 2015-02-22 SURGERY — COLONOSCOPY
Anesthesia: Moderate Sedation

## 2015-02-22 MED ORDER — MEPERIDINE HCL 100 MG/ML IJ SOLN
INTRAMUSCULAR | Status: AC
  Filled 2015-02-22: qty 2

## 2015-02-22 MED ORDER — ONDANSETRON HCL 4 MG/2ML IJ SOLN
INTRAMUSCULAR | Status: AC
  Filled 2015-02-22: qty 2

## 2015-02-22 MED ORDER — MIDAZOLAM HCL 5 MG/5ML IJ SOLN
INTRAMUSCULAR | Status: AC
  Filled 2015-02-22: qty 10

## 2015-02-22 MED ORDER — LIDOCAINE VISCOUS 2 % MT SOLN
OROMUCOSAL | Status: AC
  Filled 2015-02-22: qty 15

## 2015-02-22 MED ORDER — LIDOCAINE VISCOUS 2 % MT SOLN
OROMUCOSAL | Status: DC | PRN
  Administered 2015-02-22: 3 mL via OROMUCOSAL

## 2015-02-22 MED ORDER — PROMETHAZINE HCL 25 MG/ML IJ SOLN
INTRAMUSCULAR | Status: AC
  Filled 2015-02-22: qty 1

## 2015-02-22 MED ORDER — ONDANSETRON HCL 4 MG/2ML IJ SOLN
INTRAMUSCULAR | Status: DC | PRN
  Administered 2015-02-22: 4 mg via INTRAVENOUS

## 2015-02-22 MED ORDER — MEPERIDINE HCL 100 MG/ML IJ SOLN
INTRAMUSCULAR | Status: DC | PRN
  Administered 2015-02-22: 50 mg via INTRAVENOUS
  Administered 2015-02-22: 25 mg via INTRAVENOUS
  Administered 2015-02-22: 50 mg via INTRAVENOUS
  Administered 2015-02-22: 25 mg via INTRAVENOUS

## 2015-02-22 MED ORDER — PROMETHAZINE HCL 25 MG/ML IJ SOLN
25.0000 mg | Freq: Once | INTRAMUSCULAR | Status: AC
  Administered 2015-02-22: 25 mg via INTRAVENOUS

## 2015-02-22 MED ORDER — SODIUM CHLORIDE 0.9 % IV SOLN
INTRAVENOUS | Status: DC
  Administered 2015-02-22: 13:00:00 via INTRAVENOUS

## 2015-02-22 MED ORDER — SODIUM CHLORIDE 0.9 % IJ SOLN
INTRAMUSCULAR | Status: AC
  Filled 2015-02-22: qty 3

## 2015-02-22 MED ORDER — MIDAZOLAM HCL 5 MG/5ML IJ SOLN
INTRAMUSCULAR | Status: DC | PRN
  Administered 2015-02-22 (×2): 2 mg via INTRAVENOUS
  Administered 2015-02-22 (×2): 1 mg via INTRAVENOUS

## 2015-02-22 MED ORDER — PROMETHAZINE HCL 25 MG/ML IJ SOLN: 12.5000 mg | Freq: Once | INTRAMUSCULAR | Status: DC

## 2015-02-22 MED ORDER — STERILE WATER FOR IRRIGATION IR SOLN
Status: DC | PRN
  Administered 2015-02-22: 13:00:00

## 2015-02-22 NOTE — Interval H&P Note (Signed)
History and Physical Interval Note:  02/22/2015 1:07 PM  ARMOUR VILLANUEVA  has presented today for surgery, with the diagnosis of hx of colon polyps, barretts  The various methods of treatment have been discussed with the patient and family. After consideration of risks, benefits and other options for treatment, the patient has consented to  Procedure(s) with comments: COLONOSCOPY (N/A) - 1400 ESOPHAGOGASTRODUODENOSCOPY (EGD) (N/A) as a surgical intervention .  The patient's history has been reviewed, patient examined, no change in status, stable for surgery.  I have reviewed the patient's chart and labs.  Questions were answered to the patient's satisfaction.     Castor Gittleman  No change. Patient denies dysphagia. Surveillance EGD and diagnostic colonoscopy for plan.The risks, benefits, limitations, imponderables and alternatives regarding both EGD and colonoscopy have been reviewed with the patient. Questions have been answered. All parties agreeable.

## 2015-02-22 NOTE — Progress Notes (Signed)
Patient reported to have a "6 out of 10" left lower quadrant abdominal pain following the procedure. I came to see him. He looked be in no acute distress. His abdomen was entirely soft and non-tender. We'll allow him to go sit on the commode and he will be reassessed. Of note, no therapeutics, polypectomy, etc. performed during his colonoscopy.

## 2015-02-22 NOTE — Discharge Instructions (Addendum)
EGD Discharge instructions Please read the instructions outlined below and refer to this sheet in the next few weeks. These discharge instructions provide you with general information on caring for yourself after you leave the hospital. Your doctor may also give you specific instructions. While your treatment has been planned according to the most current medical practices available, unavoidable complications occasionally occur. If you have any problems or questions after discharge, please call your doctor. ACTIVITY  You may resume your regular activity but move at a slower pace for the next 24 hours.   Take frequent rest periods for the next 24 hours.   Walking will help expel (get rid of) the air and reduce the bloated feeling in your abdomen.   No driving for 24 hours (because of the anesthesia (medicine) used during the test).   You may shower.   Do not sign any important legal documents or operate any machinery for 24 hours (because of the anesthesia used during the test).  NUTRITION  Drink plenty of fluids.   You may resume your normal diet.   Begin with a light meal and progress to your normal diet.   Avoid alcoholic beverages for 24 hours or as instructed by your caregiver.  MEDICATIONS  You may resume your normal medications unless your caregiver tells you otherwise.  WHAT YOU CAN EXPECT TODAY  You may experience abdominal discomfort such as a feeling of fullness or gas pains.  FOLLOW-UP  Your doctor will discuss the results of your test with you.  SEEK IMMEDIATE MEDICAL ATTENTION IF ANY OF THE FOLLOWING OCCUR:  Excessive nausea (feeling sick to your stomach) and/or vomiting.   Severe abdominal pain and distention (swelling).   Trouble swallowing.   Temperature over 101 F (37.8 C).   Rectal bleeding or vomiting of blood.   Colonoscopy Discharge Instructions  Read the instructions outlined below and refer to this sheet in the next few weeks. These  discharge instructions provide you with general information on caring for yourself after you leave the hospital. Your doctor may also give you specific instructions. While your treatment has been planned according to the most current medical practices available, unavoidable complications occasionally occur. If you have any problems or questions after discharge, call Dr. Gala Romney at [phone]. ACTIVITY  You may resume your regular activity, but move at a slower pace for the next 24 hours.   Take frequent rest periods for the next 24 hours.   Walking will help get rid of the air and reduce the bloated feeling in your belly (abdomen).   No driving for 24 hours (because of the medicine (anesthesia) used during the test).    Do not sign any important legal documents or operate any machinery for 24 hours (because of the anesthesia used during the test).  NUTRITION  Drink plenty of fluids.   You may resume your normal diet as instructed by your doctor.   Begin with a light meal and progress to your normal diet. Heavy or fried foods are harder to digest and may make you feel sick to your stomach (nauseated).   Avoid alcoholic beverages for 24 hours or as instructed.  MEDICATIONS  You may resume your normal medications unless your doctor tells you otherwise.  WHAT YOU CAN EXPECT TODAY  Some feelings of bloating in the abdomen.   Passage of more gas than usual.   Spotting of blood in your stool or on the toilet paper.  IF YOU HAD POLYPS REMOVED DURING THE COLONOSCOPY:  No aspirin products for 7 days or as instructed.   No alcohol for 7 days or as instructed.   Eat a soft diet for the next 24 hours.  FINDING OUT THE RESULTS OF YOUR TEST Not all test results are available during your visit. If your test results are not back during the visit, make an appointment with your caregiver to find out the results. Do not assume everything is normal if you have not heard from your caregiver or the  medical facility. It is important for you to follow up on all of your test results.  SEEK IMMEDIATE MEDICAL ATTENTION IF:  You have more than a spotting of blood in your stool.   Your belly is swollen (abdominal distention).   You are nauseated or vomiting.   You have a temperature over 101.   You have abdominal pain or discomfort that is severe or gets worse throughout the day.    GERD and Barrett's esophagus information provided  Continue omeprazole daily  Further recommendations to follow pending review of pathology report  You should have another colonoscopy in 10 years   Gastroesophageal Reflux Disease, Adult Gastroesophageal reflux disease (GERD) happens when acid from your stomach flows up into the esophagus. When acid comes in contact with the esophagus, the acid causes soreness (inflammation) in the esophagus. Over time, GERD may create small holes (ulcers) in the lining of the esophagus. CAUSES   Increased body weight. This puts pressure on the stomach, making acid rise from the stomach into the esophagus.  Smoking. This increases acid production in the stomach.  Drinking alcohol. This causes decreased pressure in the lower esophageal sphincter (valve or ring of muscle between the esophagus and stomach), allowing acid from the stomach into the esophagus.  Late evening meals and a full stomach. This increases pressure and acid production in the stomach.  A malformed lower esophageal sphincter. Sometimes, no cause is found. SYMPTOMS   Burning pain in the lower part of the mid-chest behind the breastbone and in the mid-stomach area. This may occur twice a week or more often.  Trouble swallowing.  Sore throat.  Dry cough.  Asthma-like symptoms including chest tightness, shortness of breath, or wheezing. DIAGNOSIS  Your caregiver may be able to diagnose GERD based on your symptoms. In some cases, X-rays and other tests may be done to check for complications or to  check the condition of your stomach and esophagus. TREATMENT  Your caregiver may recommend over-the-counter or prescription medicines to help decrease acid production. Ask your caregiver before starting or adding any new medicines.  HOME CARE INSTRUCTIONS   Change the factors that you can control. Ask your caregiver for guidance concerning weight loss, quitting smoking, and alcohol consumption.  Avoid foods and drinks that make your symptoms worse, such as:  Caffeine or alcoholic drinks.  Chocolate.  Peppermint or mint flavorings.  Garlic and onions.  Spicy foods.  Citrus fruits, such as oranges, lemons, or limes.  Tomato-based foods such as sauce, chili, salsa, and pizza.  Fried and fatty foods.  Avoid lying down for the 3 hours prior to your bedtime or prior to taking a nap.  Eat small, frequent meals instead of large meals.  Wear loose-fitting clothing. Do not wear anything tight around your waist that causes pressure on your stomach.  Raise the head of your bed 6 to 8 inches with wood blocks to help you sleep. Extra pillows will not help.  Only take over-the-counter or prescription medicines for  pain, discomfort, or fever as directed by your caregiver.  Do not take aspirin, ibuprofen, or other nonsteroidal anti-inflammatory drugs (NSAIDs). SEEK IMMEDIATE MEDICAL CARE IF:   You have pain in your arms, neck, jaw, teeth, or back.  Your pain increases or changes in intensity or duration.  You develop nausea, vomiting, or sweating (diaphoresis).  You develop shortness of breath, or you faint.  Your vomit is green, yellow, black, or looks like coffee grounds or blood.  Your stool is red, bloody, or black. These symptoms could be signs of other problems, such as heart disease, gastric bleeding, or esophageal bleeding. MAKE SURE YOU:   Understand these instructions.  Will watch your condition.  Will get help right away if you are not doing well or get  worse. Document Released: 05/01/2005 Document Revised: 10/14/2011 Document Reviewed: 02/08/2011 [facility] Patient Information 2015 [place], [place]. This information is not intended to replace advice given to you by your health care provider. Make sure you discuss any questions you have with your health care provider.   Barrett's Esophagus Barrett's esophagus occurs when the lining of the esophagus is damaged. The esophagus is the tube that carries food from the mouth to the stomach. With Barrett's esophagus, the lining of the esophagus gets replaced by material that is similar to the lining in the intestines. This process is called intestinal metaplasia. A small number of people with Barrett's esophagus develop esophageal cancer. CAUSES  The exact cause of Barrett's esophagus is unknown. SYMPTOMS  Most people with Barrett's esophagus do not have symptoms. However, many patients also have gastroesophageal reflux disease (GERD). GERD can cause heartburn, trouble swallowing, and a dry cough. DIAGNOSIS Barrett's esophagus is diagnosed by an exam called upper gastrointestinal endoscopy. A thin, flexible tube (endoscope) is passed down the esophagus. The endoscope has a light and camera on the end. Your caregiver uses the endoscope to view the inside of the esophagus. A tissue sample may also be taken and examined under a microscope (biopsy). If cancer cells are found during the biopsy, this condition is called dysplasia. TREATMENT  If you have no dysplasia or low-grade dysplasia, your caregiver may recommend no treatment or only taking medicines to treat GERD. Sometimes, taking acid-blocking drugs to treat GERD helps improve the tissue affected by Barrett's esophagus. Your caregiver may also recommend periodic esophageal exams. If you have high-grade dysplasia, treatment may include removing the damaged parts of the esophagus. This can be done by heating, freezing, or surgically removing the tissue. In some  cases, surgery may be done to remove most of the esophagus. The stomach is then attached to the remaining portion of the esophagus. HOME CARE INSTRUCTIONS  Take acid-blocking drugs for GERD if recommended by your caregiver.  Keep all follow-up appointments as directed by your caregiver. You may need periodic esophageal exams. SEEK IMMEDIATE MEDICAL CARE IF:  You have chest pain.  You have trouble swallowing.  You vomit blood or material that looks like coffee grounds.  Your stools are bright red or dark. Document Released: 10/12/2003 Document Revised: 01/21/2012 Document Reviewed: 10/01/2011 [facility] Patient Information 2015 [place], [place]. This information is not intended to replace advice given to you by your health care provider. Make sure you discuss any questions you have with your health care provider.

## 2015-02-22 NOTE — Telephone Encounter (Signed)
Called pt on home number just to check on him. Got voice mail. Left a message asking for a return call if any problems and that Bx results would follow.

## 2015-02-22 NOTE — Op Note (Signed)
[facility] [address] [place], [address]   COLONOSCOPY PROCEDURE REPORT  PATIENT: Douglas, Dorsey  MR#: [id number] BIRTHDATE: 1975-09-22 , 38  yrs. old GENDER: male ENDOSCOPIST: R.  Garfield Cornea, MD FACP FACG REFERRED XK:GYJEHU Department [place] PROCEDURE DATE:  03-07-2015 PROCEDURE:   Colonoscopy, surveillance INDICATIONS:History of colonic adenoma; inadequate preparation one year ago. MEDICATIONS: Versed 6 mg IV and Demerol 150 mg IV in divided doses. Phenergan 5 mg IV.  Zofran 4 mg IV ASA CLASS:       Class II  CONSENT: The risks, benefits, alternatives and imponderables including but not limited to bleeding, perforation as well as the possibility of a missed lesion have been reviewed.  The potential for biopsy, lesion removal, etc. have also been discussed. Questions have been answered.  All parties agreeable.  Please see the history and physical in the medical record for more information.  DESCRIPTION OF PROCEDURE:   After the risks benefits and alternatives of the procedure were thoroughly explained, informed consent was obtained.  The digital rectal exam revealed no abnormalities of the rectum.   The EG-2990i ([id number])  endoscope was introduced through the anus and advanced to the cecum, which was identified by both the appendix and ileocecal valve. No adverse events experienced.   The quality of the prep was adequate  The instrument was then slowly withdrawn as the colon was fully examined. Estimated blood loss is zero unless otherwise noted in this procedure report.      COLON FINDINGS: Normal-appearing rectum.  Normal-appearing colonic mucosa.  Redundant colon?"requiring changing of the patient's position and external abdominal pressure to reach the cecum. Retroflexion was performed. .  Withdrawal time=8 minutes 0 seconds.  The scope was withdrawn and the procedure completed. COMPLICATIONS: There were no immediate  complications.  ENDOSCOPIC IMPRESSION: Normal colonoscopy (redundant colon)  RECOMMENDATIONS: Repeat colonoscopy in 10 years .  See colonoscopy report.  eSigned:  R. Garfield Cornea, MD Rosalita Chessman Marval Regal 2015-03-07 2:03 PM   cc:  CPT CODES: ICD CODES:  The ICD and CPT codes recommended by this software are interpretations from the data that the clinical staff has captured with the software.  The verification of the translation of this report to the ICD and CPT codes and modifiers is the sole responsibility of the health care institution and practicing physician where this report was generated.  [facility] [facility]. will not be held responsible for the validity of the ICD and CPT codes included on this report.  AMA assumes no liability for data contained or not contained herein. CPT is a Designer, television/film set of the [employer].

## 2015-02-22 NOTE — Op Note (Signed)
[facility] [address] [place], [address]   ENDOSCOPY PROCEDURE REPORT  PATIENT: Douglas Dorsey, Douglas Dorsey  MR#: [id number] BIRTHDATE: [birth year] , 38  yrs. old GENDER: male ENDOSCOPIST: R.  Garfield Cornea, MD FACP [facility] REFERRED BY:  [facility] PROCEDURE DATE:  03/18/15 PROCEDURE:  EGD w/ biopsy INDICATIONS:  History of Barrett's esophagus diagnosed 1 year ago. MEDICATIONS: Versed 5 mg IV and Demerol 125 mg IV in divided doses. Zofran 4 mg IV.  Phenergan 25 mg IV.  Xylocaine gel orally ASA CLASS:      Class II  CONSENT: The risks, benefits, limitations, alternatives and imponderables have been discussed.  The potential for biopsy, esophogeal dilation, etc. have also been reviewed.  Questions have been answered.  All parties agreeable.  Please see the history and physical in the medical record for more information.  DESCRIPTION OF PROCEDURE: After the risks benefits and alternatives of the procedure were thoroughly explained, informed consent was obtained.  The EG-2990i ([id number]) endoscope was introduced through the mouth and advanced to the second portion of the duodenum , limited by Without limitations. The instrument was slowly withdrawn as the mucosa was fully examined. Estimated blood loss is zero unless otherwise noted in this procedure report.    (1) 2 cm tongue of salmon-colored epithelium coming up from the GE junction; otherwise, undulating Z line.  No nodularity.  No esophagitis.  Stomach is empty.  Small hiatal hernia. Normal-appearing gastric mucosa.  Patent pylorus.  Normal first and second portion of the duodenum.  Biopsies of the abnormal distal esophageal mucosa taken for histologic study.  Retroflexed views revealed as previously described.     The scope was then withdrawn from the patient and the procedure completed.  COMPLICATIONS: There were no immediate complications.  ENDOSCOPIC IMPRESSION: Abnormal distal esophagus  consistent with prior diagnosis of short segment Barrett's esophagus?"status post biopsy. Hiatal hernia.   RECOMMENDATIONS: Follow-up on pathology. Continue omeprazole daily.  REPEAT EXAM:  eSigned:  R. Garfield Cornea, MD Rosalita Chessman [facility] 03/18/15 1:31 PM    CC:  CPT CODES: ICD CODES:  The ICD and CPT codes recommended by this software are interpretations from the data that the clinical staff has captured with the software.  The verification of the translation of this report to the ICD and CPT codes and modifiers is the sole responsibility of the health care institution and practicing physician where this report was generated.  [place]. will not be held responsible for the validity of the ICD and CPT codes included on this report.  AMA assumes no liability for data contained or not contained herein. CPT is a Designer, television/film set of the [employer].  PATIENT NAME:  Douglas Dorsey, Douglas Dorsey MR#: [id number]

## 2015-02-22 NOTE — H&P (View-Only) (Signed)
Referring Provider: Dionisio Paschal, NP Primary Care Physician:  Dionisio Paschal, NP Primary GI:  Dr. Gala Romney  Chief Complaint  Patient presents with  . Colonoscopy    HPI:   39 year old male presents to set up repeat endoscopy and colonoscopy. Both procedures last completed on 01/20/2014. Findings on EGD was patulous EG junction with abnormal distal esophagus with short segment Barrett's status post biopsy. 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 Colonoscopy was done for hematochezia which found 2 diminutive polyps in the descending colon, ascending colon mucosa so compromised by a relatively poor prep. Polyps removed found tubular adenoma. Recommended 1 year repeat on both exams for Barrett's surveillance as well as reattempt colonoscopy with adequate prep.  Today he states he's doing ok. Has GERD breakthrough symptoms occasionally based on diet. Continues on Prilosec daily. His GERD symptoms include esophageal burning, bitter taste in his mouth. Admits current abdominal pain although he did have an episode of vomiting a few days ago which was pretty hard. No further N/V since then. Has a bowel movement 1-2 times a day. Stools are loose consistent with [place] 6. Continues to have hematochezia in the toilet. Also with hemorrhoid pain symptoms. Has not been using treatment. Last time he tried Anusol his insurance would only cover topical cream and it was not very effective. Denies melena. Denies chest pain, dyspnea, dizziness, lightheadedness, syncope, near syncope. Denies any other upper or lower GI symptoms.  Past Medical History  Diagnosis Date  . Tobacco abuse   . Family history of coronary artery disease 12/24/2011  . Hypertension   . Complication of anesthesia     pt sts "they told me it took longer for them to wake me up than it should have".  . GERD (gastroesophageal reflux disease)      Past Surgical History  Procedure Laterality Date  . Hernia repair      Umbilical  . Teeth extracted      12 teeth removed in [place]  . Incisional hernia repair N/A 12/17/2013    Procedure: LAPAROSCOPIC INCISIONAL HERNIA REPAIR WITH MESH;  Surgeon: Jamesetta So, MD;  Location: [facility];  Service: General;  Laterality: N/A;  . Insertion of mesh N/A 12/17/2013    Procedure: INSERTION OF MESH;  Surgeon: Jamesetta So, MD;  Location: [facility];  Service: General;  Laterality: N/A;  . Colonoscopy N/A 01/20/2014    RMR: Colonic polyps -removed as descrbied above. Friable anal canal hemorrhoids  . Esophagogastroduodenoscopy N/A 01/20/2014    [id number] EG junction abnoraml distal esophagus- query  short segment Barretts -status post esophageal biospy. Hiatal hernia. Abnormal gastric mucosa-status post gastric biopsy.     Current Outpatient Prescriptions  Medication Sig Dispense Refill  . albuterol (PROVENTIL HFA;VENTOLIN HFA) 108 (90 BASE) MCG/ACT inhaler Inhale 1-2 puffs into the lungs every 6 (six) hours as needed for wheezing or shortness of breath. 1 Inhaler 0  . omeprazole (PRILOSEC) 20 MG capsule Take 1 capsule (20 mg total) by mouth daily. 30 capsule 11  . acetaminophen (TYLENOL) 500 MG tablet Take 500 mg by mouth every 6 (six) hours as needed for mild pain or moderate pain.    Marland Kitchen ibuprofen (ADVIL,MOTRIN) 200 MG tablet Take 800 mg by mouth every 6 (six) hours as needed for mild pain or moderate pain.    . traMADol (ULTRAM) 50 MG tablet Take 1 tablet (50 mg total) by mouth every 6 (six) hours  as needed. (Patient not taking: Reported on 02/14/2015) 12 tablet 0   No current facility-administered medications for this visit.    Allergies as of 02/14/2015  . (No Known Allergies)    Family History  Problem Relation Age of Onset  . Diabetes Mother   . Hypertension Mother   . Heart failure Father   . Diabetes Brother   . Hypertension Brother   . Lung disease    . Colon cancer Neg Hx    . Inflammatory bowel disease Neg Hx     History   Social History  . Marital Status: Married    Spouse Name: N/A  . Number of Children: N/A  . Years of Education: N/A   Social History Main Topics  . Smoking status: Current Every Day Smoker -- 2.00 packs/day for 25 years    Types: Cigarettes  . Smokeless tobacco: Not on file  . Alcohol Use: Yes     Comment: seldom  . Drug Use: No  . Sexual Activity: Yes    Birth Control/ Protection: None   Other Topics Concern  . None   Social History Narrative    Review of Systems: General: Negative for anorexia, weight loss, fever, chills, fatigue, weakness. Eyes: Negative for vision changes.  ENT: Negative for hoarseness, difficulty swallowing. CV: Negative for chest pain, angina, palpitations, dyspnea on exertion, peripheral edema.  Respiratory: Negative for dyspnea at rest, dyspnea on exertion, cough, sputum, wheezing.  GI: See history of present illness. Endo: Negative for unusual weight change.  Heme: Negative for bruising or bleeding.     Physical Exam: BP 130/84 mmHg  Pulse 93  Temp(Src) 98 F (36.7 C) (Oral)  Ht 5\' 9"  (1.753 m)  Wt 269 lb 6.4 oz (122.199 kg)  BMI 39.77 kg/m2 General:   Alert and oriented. Pleasant and cooperative. Well-nourished and well-developed.  Head:  Normocephalic and atraumatic. Eyes:  Without icterus, sclera clear and conjunctiva pink.  Ears:  Normal auditory acuity. Cardiovascular:  S1, S2 present without murmurs appreciated. Normal pulses noted. Extremities without clubbing or edema. Respiratory:  Clear to auscultation bilaterally. No wheezes, rales, or rhonchi. No distress.  Gastrointestinal:  +BS, soft, and non-distended. Mild generalized abdominal tenderness to palpation. No HSM noted. No guarding or rebound. No masses appreciated.  Rectal:  Deferred  Neurologic:  Alert and oriented x4;  grossly normal neurologically. Psych:  Alert and cooperative. Normal mood and  affect. Heme/Lymph/Immune: No excessive bruising noted.    02/14/2015 9:15 AM

## 2015-02-22 NOTE — Progress Notes (Signed)
Dr. Gala Romney in to evaluate patient with LLQ pain after procedure. No further orders received.

## 2015-02-22 NOTE — Progress Notes (Signed)
Patient's wife Douglas Dorsey verbalized "we feel like his hernia is coming back, he does complain from time to time about hernia pain at home".  Patient states "ready to go home".

## 2015-02-24 ENCOUNTER — Encounter (HOSPITAL_COMMUNITY): Payer: Self-pay | Admitting: Internal Medicine

## 2015-03-01 ENCOUNTER — Encounter: Payer: Self-pay | Admitting: Internal Medicine

## 2015-12-18 ENCOUNTER — Encounter (HOSPITAL_COMMUNITY): Payer: Self-pay

## 2015-12-18 ENCOUNTER — Emergency Department (HOSPITAL_COMMUNITY): Payer: Self-pay

## 2015-12-18 ENCOUNTER — Emergency Department (HOSPITAL_COMMUNITY)
Admission: EM | Admit: 2015-12-18 | Discharge: 2015-12-18 | Disposition: A | Discharge status: Home / Self Care | Payer: Self-pay | Attending: Emergency Medicine | Admitting: Emergency Medicine

## 2015-12-18 DIAGNOSIS — Z79899 Other long term (current) drug therapy: Secondary | ICD-10-CM | POA: Insufficient documentation

## 2015-12-18 DIAGNOSIS — M792 Neuralgia and neuritis, unspecified: Secondary | ICD-10-CM | POA: Insufficient documentation

## 2015-12-18 DIAGNOSIS — Z791 Long term (current) use of non-steroidal anti-inflammatories (NSAID): Secondary | ICD-10-CM | POA: Insufficient documentation

## 2015-12-18 DIAGNOSIS — I10 Essential (primary) hypertension: Secondary | ICD-10-CM | POA: Insufficient documentation

## 2015-12-18 DIAGNOSIS — F17210 Nicotine dependence, cigarettes, uncomplicated: Secondary | ICD-10-CM | POA: Insufficient documentation

## 2015-12-18 LAB — COMPREHENSIVE METABOLIC PANEL
ALK PHOS: 101 U/L (ref 38–126)
ALT: 21 U/L (ref 17–63)
AST: 20 U/L (ref 15–41)
Albumin: 3.8 g/dL (ref 3.5–5.0)
Anion gap: 7 (ref 5–15)
BUN: 13 mg/dL (ref 6–20)
CALCIUM: 9.2 mg/dL (ref 8.9–10.3)
CO2: 26 mmol/L (ref 22–32)
Chloride: 104 mmol/L (ref 101–111)
Creatinine, Ser: 0.77 mg/dL (ref 0.61–1.24)
GFR calc Af Amer: >60 mL/min (ref >60)
GFR calc non Af Amer: >60 mL/min (ref >60)
GLUCOSE: 104 mg/dL — AB (ref 65–99)
Potassium: 4 mmol/L (ref 3.5–5.1)
Sodium: 137 mmol/L (ref 135–145)
Total Bilirubin: 0.3 mg/dL (ref 0.3–1.2)
Total Protein: 7.4 g/dL (ref 6.5–8.1)

## 2015-12-18 LAB — CBC WITH DIFFERENTIAL/PLATELET
BASOS PCT: 0 %
Basophils Absolute: 0 10*3/uL (ref 0.0–0.1)
Eosinophils Absolute: 0.2 10*3/uL (ref 0.0–0.7)
Eosinophils Relative: 2 %
HEMATOCRIT: 47.3 % (ref 39.0–52.0)
Hemoglobin: 16 g/dL (ref 13.0–17.0)
LYMPHS PCT: 24 %
Lymphs Abs: 2.5 10*3/uL (ref 0.7–4.0)
MCH: 27.6 pg (ref 26.0–34.0)
MCHC: 33.8 g/dL (ref 30.0–36.0)
MCV: 81.7 fL (ref 78.0–100.0)
MONO ABS: 0.6 10*3/uL (ref 0.1–1.0)
Monocytes Relative: 6 %
NEUTROS ABS: 7 10*3/uL (ref 1.7–7.7)
Neutrophils Relative %: 68 %
Platelets: 251 10*3/uL (ref 150–400)
RBC: 5.79 MIL/uL (ref 4.22–5.81)
RDW: 18 % — AB (ref 11.5–15.5)
WBC: 10.4 10*3/uL (ref 4.0–10.5)

## 2015-12-18 MED ORDER — PREDNISONE 10 MG PO TABS: 20.0000 mg | ORAL_TABLET | Freq: Every day | ORAL | Status: DC

## 2015-12-18 NOTE — ED Provider Notes (Signed)
CSN: [id number]     Arrival date & time 12/18/15  1734 History   First MD Initiated Contact with Patient 12/18/15 1922     Chief Complaint  Patient presents with  . Hand Pain     (Consider location/radiation/quality/duration/timing/severity/associated sxs/prior Treatment) Patient is a 40 y.o. male presenting with hand pain. The history is provided by the patient (Patient complains of burning and numbness in both hands).  Hand Pain This is a new problem. The current episode started more than 1 week ago. The problem occurs constantly. The problem has not changed since onset.Pertinent negatives include no chest pain, no abdominal pain and no headaches. Nothing aggravates the symptoms.    Past Medical History  Diagnosis Date  . Tobacco abuse   . Family history of coronary artery disease 12/24/2011  . Hypertension   . Complication of anesthesia     pt sts "they told me it took longer for them to wake me up than it should have".  . GERD (gastroesophageal reflux disease)    Past Surgical History  Procedure Laterality Date  . Hernia repair      Umbilical  . Teeth extracted      12 teeth removed in [place]  . Incisional hernia repair N/A 12/17/2013    Procedure: LAPAROSCOPIC INCISIONAL HERNIA REPAIR WITH MESH;  Surgeon: Jamesetta So, MD;  Location: [facility];  Service: General;  Laterality: N/A;  . Insertion of mesh N/A 12/17/2013    Procedure: INSERTION OF MESH;  Surgeon: Jamesetta So, MD;  Location: [facility];  Service: General;  Laterality: N/A;  . Colonoscopy N/A 01/20/2014    RMR: Colonic polyps -removed as descrbied above. Friable anal canal hemorrhoids  . Esophagogastroduodenoscopy N/A 01/20/2014    [id number] EG junction abnoraml distal esophagus- query  short segment Barretts -status post esophageal biospy. Hiatal hernia. Abnormal gastric mucosa-status post gastric biopsy.   . Colonoscopy N/A 02/22/2015    Procedure: COLONOSCOPY;  Surgeon: Daneil Dolin, MD;  Location[facility];  Service: Endoscopy;  Laterality: N/A;  1400  . Esophagogastroduodenoscopy N/A 02/22/2015    Procedure: ESOPHAGOGASTRODUODENOSCOPY (EGD);  Surgeon: Daneil Dolin, MD;  Location: [facility];  Service: Endoscopy;  Laterality: N/A;   Family History  Problem Relation Age of Onset  . Diabetes Mother   . Hypertension Mother   . Heart failure Father   . Diabetes Brother   . Hypertension Brother   . Lung disease    . Colon cancer Neg Hx   . Inflammatory bowel disease Neg Hx    Social History  Substance Use Topics  . Smoking status: Current Every Day Smoker -- 2.00 packs/day for 25 years    Types: Cigarettes  . Smokeless tobacco: None  . Alcohol Use: No    Review of Systems  Constitutional: Negative for appetite change and fatigue.  HENT: Negative for congestion, ear discharge and sinus pressure.   Eyes: Negative for discharge.  Respiratory: Negative for cough.   Cardiovascular: Negative for chest pain.  Gastrointestinal: Negative for abdominal pain and diarrhea.  Genitourinary: Negative for frequency and hematuria.  Musculoskeletal: Negative for back pain.  Skin: Negative for rash.  Neurological: Negative for seizures and headaches.       Numbness in fingertips both hands  Psychiatric/Behavioral: Negative for hallucinations.      Allergies  Review of patient's allergies indicates no known allergies.  Home Medications   Prior to Admission medications   Medication Sig Start Date End Date Taking? Authorizing  Provider  acetaminophen (TYLENOL) 500 MG tablet Take 1,500-2,000 mg by mouth every 6 (six) hours as needed for mild pain or moderate pain.    Yes Historical Provider, MD  ibuprofen (ADVIL,MOTRIN) 200 MG tablet Take 800 mg by mouth every 6 (six) hours as needed for mild pain or moderate pain.   Yes Historical Provider, MD  naproxen sodium (ALEVE) 220 MG tablet Take 220-660 mg by mouth daily as needed (for pain).   Yes Historical Provider, MD  omeprazole (PRILOSEC)  20 MG capsule Take 1 capsule (20 mg total) by mouth daily. 01/20/15  Yes Mahala Menghini, PA-C  albuterol (PROVENTIL HFA;VENTOLIN HFA) 108 (90 BASE) MCG/ACT inhaler Inhale 1-2 puffs into the lungs every 6 (six) hours as needed for wheezing or shortness of breath. 07/11/14   Evalee Jefferson, PA-C  predniSONE (DELTASONE) 10 MG tablet Take 2 tablets (20 mg total) by mouth daily. 12/18/15   Milton Ferguson, MD   BP 115/83 mmHg  Pulse 81  Temp(Src) 98.3 F (36.8 C) (Oral)  Resp 17  Ht 5\' 8"  (1.727 m)  Wt 250 lb (113.399 kg)  BMI 38.02 kg/m2  SpO2 94% Physical Exam  Constitutional: [name] oriented to person, place, and time. He appears well-developed.  HENT:  Head: Normocephalic.  Eyes: Conjunctivae are normal.  Neck: No tracheal deviation present.  Cardiovascular:  No murmur heard. Musculoskeletal: Normal range of motion.  Minimal decreased sensation distal fingers bilaterally  Neurological: [name] oriented to person, place, and time.  Skin: Skin is warm.  Psychiatric: He has a normal mood and affect.    ED Course  Procedures (including critical care time) Labs Review Labs Reviewed  CBC WITH DIFFERENTIAL/PLATELET - Abnormal; Notable for the following:    RDW 18.0 (*)    All other components within normal limits  COMPREHENSIVE METABOLIC PANEL - Abnormal; Notable for the following:    Glucose, Bld 104 (*)    All other components within normal limits    Imaging Review Dg Cervical Spine Complete  12/18/2015  CLINICAL DATA:  BILATERAL hand pain and swelling for 2 months with numbness and tingling, dropping objects, history smoking, hypertension EXAM: CERVICAL SPINE - COMPLETE 4+ VIEW COMPARISON:  None FINDINGS: Prevertebral soft tissues normal thickness. Osseous mineralization grossly normal for technique. Disc space narrowing with endplate spur formation anteriorly at C5-C6. Vertebral body and disc space heights otherwise maintained. Bony foramina patent. No acute fracture, subluxation, or bone  destruction. Lateral cervical flexion to the RIGHT. Lung apices clear. C1-C2 alignment normal. 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 Electronically Signed   By: Lavonia Dana M.D.   On: 12/18/2015 20:06   I have personally reviewed and evaluated these images and lab results as part of my medical decision-making.   EKG Interpretation None      MDM   Final diagnoses:  Neuritis    Labs unremarkable cervical spine chest shows some disc narrowing at C5-C6. Patient will be put on prednisone for peripheral neuritis and will follow-up with family doctor    Milton Ferguson, MD 12/18/15 2252

## 2015-12-18 NOTE — ED Notes (Signed)
Pt reports for the past 2 months he had bilateral hand pain and swelling with numbness and tingling. Pt has noticed that he is dropping objects

## 2015-12-18 NOTE — Discharge Instructions (Signed)
Follow up with your md or [facility] next week for recheck

## 2015-12-18 NOTE — ED Notes (Signed)
Pt alert & oriented x4, stable gait. Patient given discharge instructions, paperwork & prescription(s). Patient  instructed to stop at the registration desk to finish any additional paperwork. Patient verbalized understanding. Pt left department w/ no further questions. 

## 2016-05-14 ENCOUNTER — Emergency Department (HOSPITAL_COMMUNITY): Payer: Medicaid Other

## 2016-05-14 ENCOUNTER — Emergency Department (HOSPITAL_COMMUNITY)
Admission: EM | Admit: 2016-05-14 | Discharge: 2016-05-14 | Disposition: A | Discharge status: Home / Self Care | Payer: Medicaid Other | Attending: Emergency Medicine | Admitting: Emergency Medicine

## 2016-05-14 ENCOUNTER — Encounter (HOSPITAL_COMMUNITY): Payer: Self-pay | Admitting: Emergency Medicine

## 2016-05-14 DIAGNOSIS — I10 Essential (primary) hypertension: Secondary | ICD-10-CM | POA: Insufficient documentation

## 2016-05-14 DIAGNOSIS — F17210 Nicotine dependence, cigarettes, uncomplicated: Secondary | ICD-10-CM | POA: Insufficient documentation

## 2016-05-14 DIAGNOSIS — R1032 Left lower quadrant pain: Secondary | ICD-10-CM | POA: Diagnosis present

## 2016-05-14 DIAGNOSIS — K439 Ventral hernia without obstruction or gangrene: Secondary | ICD-10-CM | POA: Diagnosis not present

## 2016-05-14 DIAGNOSIS — Z79899 Other long term (current) drug therapy: Secondary | ICD-10-CM | POA: Diagnosis not present

## 2016-05-14 LAB — COMPREHENSIVE METABOLIC PANEL
ALT: 22 U/L (ref 17–63)
AST: 22 U/L (ref 15–41)
Albumin: 4.1 g/dL (ref 3.5–5.0)
Alkaline Phosphatase: 98 U/L (ref 38–126)
Anion gap: 6 (ref 5–15)
BUN: 16 mg/dL (ref 6–20)
CHLORIDE: 105 mmol/L (ref 101–111)
CO2: 26 mmol/L (ref 22–32)
Calcium: 9.5 mg/dL (ref 8.9–10.3)
Creatinine, Ser: 0.89 mg/dL (ref 0.61–1.24)
GFR calc Af Amer: >60 mL/min (ref >60)
GFR calc non Af Amer: >60 mL/min (ref >60)
GLUCOSE: 106 mg/dL — AB (ref 65–99)
POTASSIUM: 4 mmol/L (ref 3.5–5.1)
SODIUM: 137 mmol/L (ref 135–145)
Total Bilirubin: 0.4 mg/dL (ref 0.3–1.2)
Total Protein: 7.9 g/dL (ref 6.5–8.1)

## 2016-05-14 LAB — CBC
HCT: 47.4 % (ref 39.0–52.0)
HEMOGLOBIN: 16.3 g/dL (ref 13.0–17.0)
MCH: 29.1 pg (ref 26.0–34.0)
MCHC: 34.4 g/dL (ref 30.0–36.0)
MCV: 84.5 fL (ref 78.0–100.0)
Platelets: 215 10*3/uL (ref 150–400)
RBC: 5.61 MIL/uL (ref 4.22–5.81)
RDW: 16.3 % — ABNORMAL HIGH (ref 11.5–15.5)
WBC: 9.7 10*3/uL (ref 4.0–10.5)

## 2016-05-14 LAB — URINALYSIS, ROUTINE W REFLEX MICROSCOPIC
BILIRUBIN URINE: NEGATIVE
GLUCOSE, UA: NEGATIVE mg/dL
Ketones, ur: NEGATIVE mg/dL
Leukocytes, UA: NEGATIVE
Nitrite: NEGATIVE
PH: 5.5 (ref 5.0–8.0)
Protein, ur: NEGATIVE mg/dL

## 2016-05-14 LAB — URINE MICROSCOPIC-ADD ON
SQUAMOUS EPITHELIAL / LPF: NONE SEEN
WBC, UA: NONE SEEN WBC/hpf (ref 0–5)

## 2016-05-14 LAB — LIPASE, BLOOD: LIPASE: 10 U/L — AB (ref 11–51)

## 2016-05-14 MED ORDER — MORPHINE SULFATE (PF) 4 MG/ML IV SOLN
4.0000 mg | Freq: Once | INTRAVENOUS | Status: AC
Start: 2016-05-14 — End: 2016-05-14
  Administered 2016-05-14: 4 mg via INTRAVENOUS
  Filled 2016-05-14: qty 1

## 2016-05-14 MED ORDER — IOPAMIDOL (ISOVUE-300) INJECTION 61%
INTRAVENOUS | Status: AC
  Administered 2016-05-14: 30 mL
  Filled 2016-05-14: qty 30

## 2016-05-14 MED ORDER — PROMETHAZINE HCL 25 MG PO TABS: 25.0000 mg | ORAL_TABLET | Freq: Four times a day (QID) | ORAL | 0 refills | Status: DC | PRN

## 2016-05-14 MED ORDER — IOPAMIDOL (ISOVUE-300) INJECTION 61%
100.0000 mL | Freq: Once | INTRAVENOUS | Status: AC | PRN
  Administered 2016-05-14: 100 mL via INTRAVENOUS

## 2016-05-14 MED ORDER — SODIUM CHLORIDE 0.9 % IV BOLUS (SEPSIS)
1000.0000 mL | Freq: Once | INTRAVENOUS | Status: AC
  Administered 2016-05-14: 1000 mL via INTRAVENOUS

## 2016-05-14 MED ORDER — ONDANSETRON HCL 4 MG/2ML IJ SOLN
4.0000 mg | Freq: Once | INTRAMUSCULAR | Status: AC
  Administered 2016-05-14: 4 mg via INTRAVENOUS
  Filled 2016-05-14: qty 2

## 2016-05-14 MED ORDER — OXYCODONE-ACETAMINOPHEN 5-325 MG PO TABS: 1.0000 | ORAL_TABLET | ORAL | 0 refills | Status: DC | PRN

## 2016-05-14 NOTE — ED Provider Notes (Signed)
[facility] DEPT Provider Note   CSN: [id number] Arrival date & time: 05/14/16  1019  By signing my name below, I, Higinio Plan, attest that this documentation has been prepared under the direction and in the presence of Nat Christen, MD . Electronically Signed: Higinio Plan, Scribe. 05/14/2016. 12:10 PM.  History   Chief Complaint Chief Complaint  Patient presents with  . Abdominal Pain   The history is provided by the patient. No language interpreter was used.   HPI Comments: Douglas Dorsey is a 40 y.o. male who presents to the Emergency Department complaining of persistent, nausea and vomiting that began 6 months ago and worsened today. Pt reports his symptoms are worse in the morning in which he is awoke from sleep with the sensation of nausea. He also reports associated LLQ abdominal pain. Pt notes PSHx of a ventral hernia repair to his RLQ and another to his LLQ in 2014 by Dr. Arnoldo Morale in [place].   Past Medical History:  Diagnosis Date  . Complication of anesthesia    pt sts "they told me it took longer for them to wake me up than it should have".  . Family history of coronary artery disease 12/24/2011  . GERD (gastroesophageal reflux disease)   . Hypertension   . Tobacco abuse     Patient Active Problem List   Diagnosis Date Noted  . Hx of colonic polyps   . Barrett's esophagus 02/14/2015  . History of adenomatous polyp of colon 02/14/2015  . Plantar fasciitis 06/23/2014  . Bloody stool 12/28/2013  . GERD (gastroesophageal reflux disease) 12/28/2013  . Ankle sprain 02/08/2012  . Cellulitis of foot 12/24/2011  . Tinea pedis 12/24/2011  . Tobacco abuse 12/24/2011  . Family history of coronary artery disease 12/24/2011    Past Surgical History:  Procedure Laterality Date  . COLONOSCOPY N/A 01/20/2014   RMR: Colonic polyps -removed as descrbied above. Friable anal canal hemorrhoids  . COLONOSCOPY N/A 02/22/2015   Procedure: COLONOSCOPY;  Surgeon: Daneil Dolin, MD;   [facility];  Service: Endoscopy;  Laterality: N/A;  1400  . ESOPHAGOGASTRODUODENOSCOPY N/A 01/20/2014   [id number] EG junction abnoraml distal esophagus- query  short segment Barretts -status post esophageal biospy. Hiatal hernia. Abnormal gastric mucosa-status post gastric biopsy.   . ESOPHAGOGASTRODUODENOSCOPY N/A 02/22/2015   Procedure: ESOPHAGOGASTRODUODENOSCOPY (EGD);  Surgeon: Daneil Dolin, MD;  Location[facility];  Service: Endoscopy;  Laterality: N/A;  . HERNIA REPAIR     Umbilical  . INCISIONAL HERNIA REPAIR N/A 12/17/2013   Procedure: LAPAROSCOPIC INCISIONAL HERNIA REPAIR WITH MESH;  Surgeon: Jamesetta So, MD;  Location: [facility];  Service: General;  Laterality: N/A;  . INSERTION OF MESH N/A 12/17/2013   Procedure: INSERTION OF MESH;  Surgeon: Jamesetta So, MD;  Location: [facility];  Service: General;  Laterality: N/A;  . teeth extracted     12 teeth removed in [facility] Medications    Prior to Admission medications   Medication Sig Start Date End Date Taking? Authorizing Provider  omeprazole (PRILOSEC) 20 MG capsule Take 1 capsule (20 mg total) by mouth daily. 01/20/15  Yes Mahala Menghini, PA-C  albuterol (PROVENTIL HFA;VENTOLIN HFA) 108 (90 BASE) MCG/ACT inhaler Inhale 1-2 puffs into the lungs every 6 (six) hours as needed for wheezing or shortness of breath. Patient not taking: Reported on 05/14/2016 07/11/14   Evalee Jefferson, PA-C  oxyCODONE-acetaminophen (PERCOCET) 5-325 MG tablet Take 1-2 tablets by mouth every 4 (  four) hours as needed. 05/14/16   Nat Christen, MD  predniSONE (DELTASONE) 10 MG tablet Take 2 tablets (20 mg total) by mouth daily. 12/18/15   Milton Ferguson, MD  promethazine (PHENERGAN) 25 MG tablet Take 1 tablet (25 mg total) by mouth every 6 (six) hours as needed. 05/14/16   Nat Christen, MD    Family History Family History  Problem Relation Age of Onset  . Diabetes Mother   . Hypertension Mother   . Heart failure Father   . Diabetes  Brother   . Hypertension Brother   . Lung disease    . Colon cancer Neg Hx   . Inflammatory bowel disease Neg Hx     Social History Social History  Substance Use Topics  . Smoking status: Current Every Day Smoker    Packs/day: 2.00    Years: 25.00    Types: Cigarettes  . Smokeless tobacco: Never Used  . Alcohol use No    Allergies   Review of patient's allergies indicates no known allergies.   Review of Systems Review of Systems  Constitutional: Negative for fever.  Gastrointestinal: Positive for abdominal pain, nausea and vomiting.   Physical Exam Updated Vital Signs BP 132/85 (BP Location: Left Arm)   Pulse 84   Temp 98.3 F (36.8 C) (Oral)   Resp 20   Ht 5\' 9"  (1.753 m)   Wt 240 lb (108.9 kg)   SpO2 97%   BMI 35.44 kg/m   Physical Exam  Constitutional: [name] oriented to person, place, and time. He appears well-developed and well-nourished.  HENT:  Head: Normocephalic and atraumatic.  Eyes: Conjunctivae are normal.  Neck: Neck supple.  Cardiovascular: Normal rate and regular rhythm.   Pulmonary/Chest: Effort normal and breath sounds normal.  Abdominal: There is tenderness.  Vertical surgical scar above his umbilicus about 8 cm in length, left of that, pt is tender and feels like a ventral hernia.  Musculoskeletal: Normal range of motion.  Neurological: [name] alert and oriented to person, place, and time.  Skin: Skin is warm and dry.  Psychiatric: He has a normal mood and affect. His behavior is normal.  Nursing note and vitals reviewed.  ED Treatments / Results  Labs (all labs ordered are listed, but only abnormal results are displayed) Labs Reviewed  LIPASE, BLOOD - Abnormal; Notable for the following:       Result Value   Lipase 10 (*)    All other components within normal limits  COMPREHENSIVE METABOLIC PANEL - Abnormal; Notable for the following:    Glucose, Bld 106 (*)    All other components within normal limits  CBC - Abnormal; Notable for  the following:    RDW 16.3 (*)    All other components within normal limits  URINALYSIS, ROUTINE W REFLEX MICROSCOPIC (NOT AT [facility]) - Abnormal; Notable for the following:    Specific Gravity, Urine >1.030 (*)    Hgb urine dipstick TRACE (*)    All other components within normal limits  URINE MICROSCOPIC-ADD ON - Abnormal; Notable for the following:    Bacteria, UA RARE (*)    All other components within normal limits    EKG  EKG Interpretation None       Radiology Ct Abdomen Pelvis W Contrast  Result Date: 05/14/2016 CLINICAL DATA:  Left supraumbilical abdominal pain. Nausea and vomiting for 6 months. Previous ventral hernia repair. EXAM: CT ABDOMEN AND PELVIS WITH CONTRAST TECHNIQUE: Multidetector CT imaging of the abdomen and pelvis was  performed using the standard protocol following bolus administration of intravenous contrast. CONTRAST:  136mL ISOVUE-300 IOPAMIDOL (ISOVUE-300) INJECTION 61% COMPARISON:  04/10/2010 FINDINGS: Lower Chest: No acute findings. Hepatobiliary:  No mass identified.  Gallbladder is unremarkable. Pancreas:  No mass or inflammatory changes. Spleen: Within normal limits in size and appearance. Adrenals/Urinary Tract: No masses identified. No evidence of hydronephrosis. Stomach/Bowel: No evidence of obstruction, inflammatory process or abnormal fluid collections. Normal appendix visualized. Vascular/Lymphatic: No pathologically enlarged lymph nodes. No abdominal aortic aneurysm. Aortic atherosclerosis. Reproductive:  No mass identified. Other: Surgical mesh again seen within supraumbilical anterior abdominal wall. There is a new small ventral hernia containing only fat along the left lateral margin of the surgical mesh. No evidence of herniated bowel loops, inflammatory process, or abnormal fluid collections. Musculoskeletal:  No suspicious bone lesions identified. IMPRESSION: Small left supraumbilical ventral hernia containing only fat, which is located along the left  lateral margin of surgical mesh and new since prior study in 2011. No evidence of herniated bowel loops or other complication. Aortic atherosclerosis. Electronically Signed   By: Earle Gell M.D.   On: 05/14/2016 14:00    Procedures Procedures (including critical care time)  Medications Ordered in ED Medications  sodium chloride 0.9 % bolus 1,000 mL (0 mLs Intravenous Stopped 05/14/16 1436)  morphine 4 MG/ML injection 4 mg (4 mg Intravenous Given 05/14/16 1218)  ondansetron (ZOFRAN) injection 4 mg (4 mg Intravenous Given 05/14/16 1218)  iopamidol (ISOVUE-300) 61 % injection (30 mLs  Contrast Given 05/14/16 1251)  iopamidol (ISOVUE-300) 61 % injection 100 mL (100 mLs Intravenous Contrast Given 05/14/16 1318)    DIAGNOSTIC STUDIES:  Oxygen Saturation is 97% on RA, normal by my interpretation.    COORDINATION OF CARE:  12:07 PM Discussed treatment plan with pt at bedside which includes CT abdomen and pt agreed to plan.  Initial Impression / Assessment and Plan / ED Course  I have reviewed the triage vital signs and the nursing notes.  Pertinent labs & imaging results that were available during my care of the patient were reviewed by me and considered in my medical decision making (see chart for details).  Clinical Course    No acute abdomen. Discussed CT scan results with Dr. Aviva Signs. He will see the patient as an outpatient. Discharge medication Percocet and Phenergan 25 mg  I personally performed the services described in this documentation, which was scribed in my presence. The recorded information has been reviewed and is accurate.   Final Clinical Impressions(s) / ED Diagnoses   Final diagnoses:  Ventral hernia without obstruction or gangrene    New Prescriptions New Prescriptions   OXYCODONE-ACETAMINOPHEN (PERCOCET) 5-325 MG TABLET    Take 1-2 tablets by mouth every 4 (four) hours as needed.   PROMETHAZINE (PHENERGAN) 25 MG TABLET    Take 1 tablet (25 mg total) by  mouth every 6 (six) hours as needed.     Nat Christen, MD 05/14/16 1455

## 2016-05-14 NOTE — ED Triage Notes (Signed)
Pt states he has been waking up to nausea and vomiting x 2 weeks, today onset of pain

## 2016-05-14 NOTE — Discharge Instructions (Signed)
You have a small amount of fat in your hernia. Recommend follow-up with Dr. Arnoldo Morale. Phone number given. Medication for pain and nausea.

## 2016-12-16 ENCOUNTER — Encounter (HOSPITAL_COMMUNITY): Payer: Self-pay

## 2016-12-16 NOTE — Pre-Procedure Instructions (Signed)
TEHRAN RABENOLD  12/16/2016      [id number] - [place], [place] - [place] [address] [place] [address] Phone: [phone] Fax: [phone]  [place], [place] [address] Phone: [phone] Fax: [phone]    Your procedure is scheduled on Dec 20, 2016.  Report to [facility] Admitting at 5:30 A.M.  Call this number if you have problems the morning of surgery:  [phone]   Call [phone] if you have any questions prior to your surgery date Monday-Friday 8am-4pm    Remember:  Do not eat food or drink liquids after midnight.  Take these medicines the morning of surgery with A SIP OF WATER Omeprazole (Prilosec)  7 days prior to surgery STOP taking any Aspirin, Aleve, Naproxen, Ibuprofen, Motrin, Advil, Goody's, BC's, all herbal medications, fish oil, and all vitamins   Do not wear jewelry  Do not wear lotions, powders, or perfumes, or deoderant.  Do not shave 48 hours prior to surgery.  Men may shave face and neck.  Do not bring valuables to the hospital.   [facility] is not responsible for any belongings or valuables.  Contacts, dentures or bridgework may not be worn into surgery.  Leave your suitcase in the car.  After surgery it may be brought to your room.  For patients admitted to the hospital, discharge time will be determined by your treatment team.  Patients discharged the day of surgery will not be allowed to drive home.    Special instructions:   [facility]- Preparing For Surgery  Before surgery, you can play an important role. Because skin is not sterile, your skin needs to be as free of germs as possible. You can reduce the number of germs on your skin by washing with CHG (chlorahexidine gluconate) Soap before surgery.  CHG is an antiseptic cleaner which kills germs and bonds with the skin to continue killing germs even after  washing.  Please do not use if you have an allergy to CHG or antibacterial soaps. If your skin becomes reddened/irritated stop using the CHG.  Do not shave (including legs and underarms) for at least 48 hours prior to first CHG shower. It is OK to shave your face.  Please follow these instructions carefully.   1. Shower the NIGHT BEFORE SURGERY and the MORNING OF SURGERY with CHG.   2. If you chose to wash your hair, wash your hair first as usual with your normal shampoo.  3. After you shampoo, rinse your hair and body thoroughly to remove the shampoo.  4. Use CHG as you would any other liquid soap. You can apply CHG directly to the skin and wash gently with a scrungie or a clean washcloth.   5. Apply the CHG Soap to your body ONLY FROM THE NECK DOWN.  Do not use on open wounds or open sores. Avoid contact with your eyes, ears, mouth and genitals (private parts). Wash genitals (private parts) with your normal soap.  6. Wash thoroughly, paying special attention to the area where your surgery will be performed.  7. Thoroughly rinse your body with warm water from the neck down.  8. DO NOT shower/wash with your normal soap after using and rinsing off the CHG Soap.  9. Pat yourself dry with a CLEAN TOWEL.   10. Wear CLEAN PAJAMAS   11. Place CLEAN SHEETS on your bed the night of  your first shower and DO NOT SLEEP WITH PETS.    Day of Surgery: Do not apply any deodorants/lotions. Please wear clean clothes to the hospital/surgery center.      Please read over the following fact sheets that you were given. Pain Booklet and Surgical Site Infection Prevention

## 2016-12-17 ENCOUNTER — Encounter (HOSPITAL_COMMUNITY)
Admission: RE | Admit: 2016-12-17 | Discharge: 2016-12-17 | Disposition: A | Discharge status: Home / Self Care | Payer: Medicaid Other | Source: Ambulatory Visit | Attending: Oral Surgery | Admitting: Oral Surgery

## 2016-12-17 ENCOUNTER — Encounter (HOSPITAL_COMMUNITY): Payer: Self-pay

## 2016-12-17 DIAGNOSIS — M722 Plantar fascial fibromatosis: Secondary | ICD-10-CM | POA: Insufficient documentation

## 2016-12-17 DIAGNOSIS — K219 Gastro-esophageal reflux disease without esophagitis: Secondary | ICD-10-CM | POA: Insufficient documentation

## 2016-12-17 DIAGNOSIS — L03119 Cellulitis of unspecified part of limb: Secondary | ICD-10-CM | POA: Insufficient documentation

## 2016-12-17 DIAGNOSIS — K2270 Barrett's esophagus without dysplasia: Secondary | ICD-10-CM | POA: Insufficient documentation

## 2016-12-17 DIAGNOSIS — Z01812 Encounter for preprocedural laboratory examination: Secondary | ICD-10-CM | POA: Insufficient documentation

## 2016-12-17 DIAGNOSIS — B353 Tinea pedis: Secondary | ICD-10-CM | POA: Insufficient documentation

## 2016-12-17 DIAGNOSIS — I10 Essential (primary) hypertension: Secondary | ICD-10-CM | POA: Diagnosis not present

## 2016-12-17 DIAGNOSIS — K921 Melena: Secondary | ICD-10-CM | POA: Diagnosis not present

## 2016-12-17 DIAGNOSIS — Z01810 Encounter for preprocedural cardiovascular examination: Secondary | ICD-10-CM | POA: Insufficient documentation

## 2016-12-17 DIAGNOSIS — Z720 Tobacco use: Secondary | ICD-10-CM | POA: Insufficient documentation

## 2016-12-17 LAB — BASIC METABOLIC PANEL
Anion gap: 9 (ref 5–15)
BUN: 17 mg/dL (ref 6–20)
CO2: 20 mmol/L — ABNORMAL LOW (ref 22–32)
Calcium: 9.6 mg/dL (ref 8.9–10.3)
Chloride: 108 mmol/L (ref 101–111)
Creatinine, Ser: 0.95 mg/dL (ref 0.61–1.24)
GFR calc non Af Amer: >60 mL/min (ref >60)
Glucose, Bld: 137 mg/dL — ABNORMAL HIGH (ref 65–99)
Potassium: 4.3 mmol/L (ref 3.5–5.1)
SODIUM: 137 mmol/L (ref 135–145)

## 2016-12-17 LAB — CBC
HEMATOCRIT: 49 % (ref 39.0–52.0)
HEMOGLOBIN: 17 g/dL (ref 13.0–17.0)
MCH: 29.9 pg (ref 26.0–34.0)
MCHC: 34.7 g/dL (ref 30.0–36.0)
MCV: 86.3 fL (ref 78.0–100.0)
Platelets: 203 10*3/uL (ref 150–400)
RBC: 5.68 MIL/uL (ref 4.22–5.81)
RDW: 14.6 % (ref 11.5–15.5)
WBC: 10.8 10*3/uL — ABNORMAL HIGH (ref 4.0–10.5)

## 2016-12-17 NOTE — H&P (Signed)
HISTORY AND PHYSICAL  Douglas Dorsey is a 41 y.o. male patient referred by general dentist for full mouth extractions prior to denture fabrication.  No diagnosis found.  Past Medical History:  Diagnosis Date  . Complication of anesthesia    pt sts "they told me it took longer for them to wake me up than it should have".  . Family history of coronary artery disease 12/24/2011  . GERD (gastroesophageal reflux disease)   . Hypertension   . Tobacco abuse     No current facility-administered medications for this encounter.    Current Outpatient Prescriptions  Medication Sig Dispense Refill  . omeprazole (PRILOSEC) 20 MG capsule Take 1 capsule (20 mg total) by mouth daily. (Patient taking differently: Take 40 mg by mouth at bedtime. ) 30 capsule 11   No Known Allergies Active Problems:   * No active hospital problems. *  Vitals: There were no vitals taken for this visit. Lab results: Results for orders placed or performed during the hospital encounter of 12/17/16 (from the past 24 hour(s))  Basic metabolic panel     Status: Abnormal   Collection Time: 12/17/16  8:37 AM  Result Value Ref Range   Sodium 137 135 - 145 mmol/L   Potassium 4.3 3.5 - 5.1 mmol/L   Chloride 108 101 - 111 mmol/L   CO2 20 (L) 22 - 32 mmol/L   Glucose, Bld 137 (H) 65 - 99 mg/dL   BUN 17 6 - 20 mg/dL   Creatinine, Ser 0.95 0.61 - 1.24 mg/dL   Calcium 9.6 8.9 - 10.3 mg/dL   GFR calc non Af Amer >60 >60 mL/min   GFR calc Af Amer >60 >60 mL/min   Anion gap 9 5 - 15  CBC     Status: Abnormal   Collection Time: 12/17/16  8:37 AM  Result Value Ref Range   WBC 10.8 (H) 4.0 - 10.5 K/uL   RBC 5.68 4.22 - 5.81 MIL/uL   Hemoglobin 17.0 13.0 - 17.0 g/dL   HCT 49.0 39.0 - 52.0 %   MCV 86.3 78.0 - 100.0 fL   MCH 29.9 26.0 - 34.0 pg   MCHC 34.7 30.0 - 36.0 g/dL   RDW 14.6 11.5 - 15.5 %   Platelets 203 150 - 400 K/uL   Radiology Results: No results found. General appearance: alert, cooperative, no distress and  moderately obese Head: Normocephalic, without obvious abnormality, atraumatic Eyes: negative Nose: Nares normal. Septum midline. Mucosa normal. No drainage or sinus tenderness. Throat: multiple carious teeth, 27mm x 28mm exophytic lesion uvula, pharynx clear Neck: no adenopathy, supple, symmetrical, trachea midline and thyroid not enlarged, symmetric, no tenderness/mass/nodules Resp: clear to auscultation bilaterally Cardio: regular rate and rhythm, S1, S2 normal, no murmur, click, rub or gallop  Assessment: Nonrestorable teeth secondary to dental caries. Lesion uvula.  Plan: Full dental extractions with alveoloplasty. Removal lesion uvula. GA. Day surgery.   [name] 12/17/2016

## 2016-12-17 NOTE — Progress Notes (Signed)
   12/17/16 0832  OBSTRUCTIVE SLEEP APNEA  Have you ever been diagnosed with sleep apnea through a sleep study? No  Do you snore loudly (loud enough to be heard through closed doors)?  0  Do you often feel tired, fatigued, or sleepy during the daytime (such as falling asleep during driving or talking to someone)? 1  Has anyone observed you stop breathing during your sleep? 1  Do you have, or are you being treated for high blood pressure? 1  BMI more than 35 kg/m2? 1  Age > 61 (1-yes) 0  Male Gender (Yes=1) 1  Obstructive Sleep Apnea Score 5  Score 5 or greater  Results sent to PCP

## 2016-12-17 NOTE — Progress Notes (Signed)
Anesthesia Chart Review:  Pt is a 41 year old male scheduled for multiple dental extractions with alveoloplasty/biopsy lesion on 12/20/2016 with Diona Browner, DDS  - PCP is Dionisio Paschal, NP  PMH includes:  HTN, GERD. Current smoker. BMI 36. S/p incisional hernia repair 12/17/13  Anesthesia history: pt reports he was "hard to wake up" from anesthesia for surgery at [facility] 08/25/03.  Anesthesia record on paper chart.  No issues with anesthesia noted. Pt had surgery at [facility] 1/76/16; no complications from anesthesia noted.   Medications include: prilosec. No BP meds listed  BP 136/89   Pulse 94   Temp 36.7 C   Resp 20   Ht 5\' 9"  (1.753 m)   Wt 241 lb 12.8 oz (109.7 kg)   SpO2 98%   BMI 35.71 kg/m   Preoperative labs reviewed.    EKG 12/17/16: NSR  If no changes, I anticipate pt can proceed with surgery as scheduled.   Willeen Cass, FNP-BC [facility]/Anesthesiology Phone: [phone] 12/17/2016 1:49 PM

## 2016-12-17 NOTE — Progress Notes (Addendum)
PCP is Craige Cotta, NP Denies ever seeing a cardiologist. Denies ever having a stress test, card cath, or echo. Denies any chest pain.  burn noted to his upper left arm- scab noted to area States at [facility] he was hard to wake up- anesthesia records requested. (2015) States he had hernia surgery at [facility] and everything was fine.(states maybe 2004)

## 2016-12-20 ENCOUNTER — Ambulatory Visit (HOSPITAL_COMMUNITY): Payer: Medicaid Other | Admitting: Emergency Medicine

## 2016-12-20 ENCOUNTER — Encounter (HOSPITAL_COMMUNITY)
Admission: RE | Disposition: A | Discharge status: Home / Self Care | Payer: Self-pay | Source: Ambulatory Visit | Attending: Oral Surgery

## 2016-12-20 ENCOUNTER — Ambulatory Visit (HOSPITAL_COMMUNITY)
Admission: RE | Admit: 2016-12-20 | Discharge: 2016-12-20 | Disposition: A | Discharge status: Home / Self Care | Payer: Medicaid Other | Source: Ambulatory Visit | Attending: Oral Surgery | Admitting: Oral Surgery

## 2016-12-20 ENCOUNTER — Encounter (HOSPITAL_COMMUNITY): Payer: Self-pay | Admitting: Urology

## 2016-12-20 ENCOUNTER — Ambulatory Visit (HOSPITAL_COMMUNITY): Payer: Medicaid Other | Admitting: Anesthesiology

## 2016-12-20 DIAGNOSIS — F17210 Nicotine dependence, cigarettes, uncomplicated: Secondary | ICD-10-CM | POA: Insufficient documentation

## 2016-12-20 DIAGNOSIS — K219 Gastro-esophageal reflux disease without esophagitis: Secondary | ICD-10-CM | POA: Diagnosis not present

## 2016-12-20 DIAGNOSIS — Z6835 Body mass index (BMI) 35.0-35.9, adult: Secondary | ICD-10-CM | POA: Diagnosis not present

## 2016-12-20 DIAGNOSIS — K029 Dental caries, unspecified: Secondary | ICD-10-CM | POA: Diagnosis not present

## 2016-12-20 DIAGNOSIS — K1370 Unspecified lesions of oral mucosa: Secondary | ICD-10-CM | POA: Diagnosis not present

## 2016-12-20 DIAGNOSIS — Z79899 Other long term (current) drug therapy: Secondary | ICD-10-CM | POA: Diagnosis not present

## 2016-12-20 DIAGNOSIS — I10 Essential (primary) hypertension: Secondary | ICD-10-CM | POA: Diagnosis not present

## 2016-12-20 DIAGNOSIS — E669 Obesity, unspecified: Secondary | ICD-10-CM | POA: Diagnosis not present

## 2016-12-20 HISTORY — PX: MULTIPLE EXTRACTIONS WITH ALVEOLOPLASTY: SHX5342

## 2016-12-20 SURGERY — MULTIPLE EXTRACTION WITH ALVEOLOPLASTY
Anesthesia: General | Site: Mouth | Laterality: Bilateral

## 2016-12-20 MED ORDER — OXYMETAZOLINE HCL 0.05 % NA SOLN
NASAL | Status: AC
  Filled 2016-12-20: qty 15

## 2016-12-20 MED ORDER — OXYCODONE HCL 5 MG PO TABS
5.0000 mg | ORAL_TABLET | Freq: Once | ORAL | Status: AC | PRN
  Administered 2016-12-20: 5 mg via ORAL

## 2016-12-20 MED ORDER — FENTANYL CITRATE (PF) 100 MCG/2ML IJ SOLN
INTRAMUSCULAR | Status: DC | PRN
  Administered 2016-12-20: 50 ug via INTRAVENOUS
  Administered 2016-12-20: 150 ug via INTRAVENOUS
  Administered 2016-12-20: 100 ug via INTRAVENOUS

## 2016-12-20 MED ORDER — ARTIFICIAL TEARS OPHTHALMIC OINT
TOPICAL_OINTMENT | OPHTHALMIC | Status: AC
Start: 2016-12-20 — End: ?
  Filled 2016-12-20: qty 3.5

## 2016-12-20 MED ORDER — OXYMETAZOLINE HCL 0.05 % NA SOLN
NASAL | Status: DC | PRN
  Administered 2016-12-20: 1 via TOPICAL

## 2016-12-20 MED ORDER — ROCURONIUM BROMIDE 100 MG/10ML IV SOLN
INTRAVENOUS | Status: DC | PRN
  Administered 2016-12-20: 10 mg via INTRAVENOUS
  Administered 2016-12-20: 20 mg via INTRAVENOUS

## 2016-12-20 MED ORDER — ONDANSETRON HCL 4 MG/2ML IJ SOLN
INTRAMUSCULAR | Status: AC
  Filled 2016-12-20: qty 2

## 2016-12-20 MED ORDER — OXYCODONE HCL 5 MG PO TABS
ORAL_TABLET | ORAL | Status: AC
  Filled 2016-12-20: qty 1

## 2016-12-20 MED ORDER — ARTIFICIAL TEARS OPHTHALMIC OINT
TOPICAL_OINTMENT | OPHTHALMIC | Status: DC | PRN
  Administered 2016-12-20: 1 via OPHTHALMIC

## 2016-12-20 MED ORDER — PROPOFOL 10 MG/ML IV BOLUS
INTRAVENOUS | Status: DC | PRN
  Administered 2016-12-20: 200 mg via INTRAVENOUS
  Administered 2016-12-20 (×2): 100 mg via INTRAVENOUS

## 2016-12-20 MED ORDER — OXYCODONE HCL 5 MG/5ML PO SOLN
ORAL | Status: AC
  Filled 2016-12-20: qty 5

## 2016-12-20 MED ORDER — PROPOFOL 10 MG/ML IV BOLUS
INTRAVENOUS | Status: AC
  Filled 2016-12-20: qty 20

## 2016-12-20 MED ORDER — PROPOFOL 10 MG/ML IV BOLUS
INTRAVENOUS | Status: AC
  Filled 2016-12-20: qty 40

## 2016-12-20 MED ORDER — 0.9 % SODIUM CHLORIDE (POUR BTL) OPTIME
TOPICAL | Status: DC | PRN
  Administered 2016-12-20: 1000 mL

## 2016-12-20 MED ORDER — OXYCODONE-ACETAMINOPHEN 5-325 MG PO TABS: 1.0000 | ORAL_TABLET | ORAL | 0 refills | Status: DC | PRN

## 2016-12-20 MED ORDER — CEFAZOLIN SODIUM-DEXTROSE 2-4 GM/100ML-% IV SOLN
2.0000 g | INTRAVENOUS | Status: AC
  Administered 2016-12-20: 2 g via INTRAVENOUS
  Filled 2016-12-20: qty 100

## 2016-12-20 MED ORDER — LACTATED RINGERS IV SOLN
INTRAVENOUS | Status: DC | PRN
  Administered 2016-12-20: 07:00:00 via INTRAVENOUS

## 2016-12-20 MED ORDER — SUCCINYLCHOLINE CHLORIDE 20 MG/ML IJ SOLN
INTRAMUSCULAR | Status: DC | PRN
  Administered 2016-12-20: 120 mg via INTRAVENOUS

## 2016-12-20 MED ORDER — HYDROMORPHONE HCL 1 MG/ML IJ SOLN: 0.2500 mg | INTRAMUSCULAR | Status: DC | PRN

## 2016-12-20 MED ORDER — SUGAMMADEX SODIUM 200 MG/2ML IV SOLN
INTRAVENOUS | Status: AC
  Filled 2016-12-20: qty 2

## 2016-12-20 MED ORDER — FENTANYL CITRATE (PF) 250 MCG/5ML IJ SOLN
INTRAMUSCULAR | Status: AC
  Filled 2016-12-20: qty 5

## 2016-12-20 MED ORDER — LIDOCAINE-EPINEPHRINE 2 %-1:100000 IJ SOLN
INTRAMUSCULAR | Status: AC
  Filled 2016-12-20: qty 1

## 2016-12-20 MED ORDER — LIDOCAINE 2% (20 MG/ML) 5 ML SYRINGE
INTRAMUSCULAR | Status: AC
  Filled 2016-12-20: qty 5

## 2016-12-20 MED ORDER — MIDAZOLAM HCL 5 MG/5ML IJ SOLN
INTRAMUSCULAR | Status: DC | PRN
  Administered 2016-12-20: 2 mg via INTRAVENOUS

## 2016-12-20 MED ORDER — SUGAMMADEX SODIUM 200 MG/2ML IV SOLN
INTRAVENOUS | Status: DC | PRN
  Administered 2016-12-20: 150 mg via INTRAVENOUS

## 2016-12-20 MED ORDER — ROCURONIUM BROMIDE 10 MG/ML (PF) SYRINGE
PREFILLED_SYRINGE | INTRAVENOUS | Status: AC
  Filled 2016-12-20: qty 5

## 2016-12-20 MED ORDER — ONDANSETRON HCL 4 MG/2ML IJ SOLN
INTRAMUSCULAR | Status: DC | PRN
  Administered 2016-12-20: 4 mg via INTRAVENOUS

## 2016-12-20 MED ORDER — PROMETHAZINE HCL 25 MG/ML IJ SOLN: 6.2500 mg | INTRAMUSCULAR | Status: DC | PRN

## 2016-12-20 MED ORDER — SODIUM CHLORIDE 0.9 % IR SOLN
Status: DC | PRN
  Administered 2016-12-20: 1000 mL

## 2016-12-20 MED ORDER — FENTANYL CITRATE (PF) 250 MCG/5ML IJ SOLN
INTRAMUSCULAR | Status: AC
Start: 2016-12-20 — End: ?
  Filled 2016-12-20: qty 5

## 2016-12-20 MED ORDER — LIDOCAINE-EPINEPHRINE 2 %-1:100000 IJ SOLN
INTRAMUSCULAR | Status: DC | PRN
  Administered 2016-12-20: 20 mL

## 2016-12-20 MED ORDER — SUCCINYLCHOLINE CHLORIDE 200 MG/10ML IV SOSY
PREFILLED_SYRINGE | INTRAVENOUS | Status: AC
Start: 2016-12-20 — End: ?
  Filled 2016-12-20: qty 10

## 2016-12-20 MED ORDER — OXYCODONE HCL 5 MG/5ML PO SOLN: 5.0000 mg | Freq: Once | ORAL | Status: AC | PRN

## 2016-12-20 MED ORDER — MIDAZOLAM HCL 2 MG/2ML IJ SOLN
INTRAMUSCULAR | Status: AC
  Filled 2016-12-20: qty 2

## 2016-12-20 MED ORDER — LIDOCAINE HCL (CARDIAC) 20 MG/ML IV SOLN
INTRAVENOUS | Status: DC | PRN
Start: 2016-12-20 — End: 2016-12-20
  Administered 2016-12-20: 100 mg via INTRAVENOUS

## 2016-12-20 SURGICAL SUPPLY — 32 items
BUR CROSS CUT FISSURE 1.6 (BURR) ×2 IMPLANT
BUR CROSS CUT FISSURE 1.6MM (BURR) ×1
BUR EGG ELITE 4.0 (BURR) ×2 IMPLANT
BUR EGG ELITE 4.0MM (BURR) ×1
CANISTER SUCT 3000ML PPV (MISCELLANEOUS) ×3 IMPLANT
COVER SURGICAL LIGHT HANDLE (MISCELLANEOUS) ×3 IMPLANT
CRADLE DONUT ADULT HEAD (MISCELLANEOUS) ×3 IMPLANT
DECANTER SPIKE VIAL GLASS SM (MISCELLANEOUS) ×3 IMPLANT
DRAPE U-SHAPE 76X120 STRL (DRAPES) ×3 IMPLANT
FLUID NSS /IRRIG 1000 ML XXX (MISCELLANEOUS) ×3 IMPLANT
GAUZE PACKING FOLDED 2  STR (GAUZE/BANDAGES/DRESSINGS) ×2
GAUZE PACKING FOLDED 2 STR (GAUZE/BANDAGES/DRESSINGS) ×1 IMPLANT
GLOVE BIO SURGEON STRL SZ 6.5 (GLOVE) ×2 IMPLANT
GLOVE BIO SURGEON STRL SZ7.5 (GLOVE) ×3 IMPLANT
GLOVE BIO SURGEONS STRL SZ 6.5 (GLOVE) ×1
GLOVE BIOGEL PI IND STRL 7.0 (GLOVE) IMPLANT
GLOVE BIOGEL PI INDICATOR 7.0 (GLOVE)
GOWN STRL REUS W/ TWL LRG LVL3 (GOWN DISPOSABLE) ×1 IMPLANT
GOWN STRL REUS W/ TWL XL LVL3 (GOWN DISPOSABLE) ×1 IMPLANT
GOWN STRL REUS W/TWL LRG LVL3 (GOWN DISPOSABLE) ×2
GOWN STRL REUS W/TWL XL LVL3 (GOWN DISPOSABLE) ×2
KIT BASIN OR (CUSTOM PROCEDURE TRAY) ×3 IMPLANT
KIT ROOM TURNOVER OR (KITS) ×3 IMPLANT
NEEDLE 22X1 1/2 (OR ONLY) (NEEDLE) ×6 IMPLANT
NS IRRIG 1000ML POUR BTL (IV SOLUTION) ×3 IMPLANT
PAD ARMBOARD 7.5X6 YLW CONV (MISCELLANEOUS) ×3 IMPLANT
SPECIMEN JAR SMALL (MISCELLANEOUS) ×3 IMPLANT
SUT CHROMIC 3 0 PS 2 (SUTURE) ×6 IMPLANT
SYR CONTROL 10ML LL (SYRINGE) ×3 IMPLANT
TRAY ENT MC OR (CUSTOM PROCEDURE TRAY) ×3 IMPLANT
TUBING IRRIGATION (MISCELLANEOUS) ×3 IMPLANT
YANKAUER SUCT BULB TIP NO VENT (SUCTIONS) ×3 IMPLANT

## 2016-12-20 NOTE — Anesthesia Postprocedure Evaluation (Signed)
Anesthesia Post Note  Patient: Douglas Dorsey  Procedure(s) Performed: Procedure(s) (LRB): MULTIPLE EXTRACTION OF TEETH NUMBER ONE, THREE, SIX, SEVEN, EIGHT, NINE, TEN, ELEVEN, FOURTEEN, NINETEEN, TWENTY, TWENTY-ONE, TWENTY-TWO, TWENTY-THREE, TWENTY-FOUR, TWENTY- FIVE, TWENTY-SIX, TWENTY-SEVEN, TWENTY-EIGHT, TWENTY-NINE, THIRTY  ;   ALVEOLOPLASTY; BIOPSY LESION OF UVULA (Bilateral)  Patient location during evaluation: PACU Anesthesia Type: General Level of consciousness: awake and alert Pain management: pain level controlled Vital Signs Assessment: post-procedure vital signs reviewed and stable Respiratory status: spontaneous breathing, nonlabored ventilation, respiratory function stable and patient connected to nasal cannula oxygen Cardiovascular status: blood pressure returned to baseline and stable Postop Assessment: no signs of nausea or vomiting Anesthetic complications: no       Last Vitals:  Vitals:   12/20/16 0930 12/20/16 0937  BP: 128/81   Pulse: 82   Resp: 18   Temp:  36.5 C    Last Pain:  Vitals:   12/20/16 1000  TempSrc:   PainSc: 6                  Ryan P Ellender

## 2016-12-20 NOTE — Op Note (Signed)
NAME:  ELIN, SEATS NO.:  MEDICAL RECORD NO.:  [id number]  LOCATION:                                 FACILITY:  PHYSICIAN:  [name], M.D.       DATE OF BIRTH:  DATE OF PROCEDURE:  12/20/2016 DATE OF DISCHARGE:                              OPERATIVE REPORT   PREOPERATIVE DIAGNOSIS:  Nonrestorable teeth secondary to dental caries numbers 1, 3, 6, 7, 8, 9, 10, 11, 14, 19, 20, 21, 22, 23, 24, 25, 26, 27, 28, 29, 30.  Lesion of uvula.  POSTOPERATIVE DIAGNOSIS:  Nonrestorable teeth secondary to dental caries numbers 1, 3, 6, 7, 8, 9, 10, 11, 14, 19, 20, 21, 22, 23, 24, 25, 26, 27, 28, 29, 30.  Lesion of uvula.  PROCEDURE:  Extraction of teeth numbers 1, 3, 6, 7, 8, 9, 10, 11, 14, 19, 20, 21, 22, 23, 24, 25, 26, 27, 28, 29, 30, alveoplasty right and left maxilla and mandible.  Removal of lesion of uvula.  SURGEON:  [name], M.D.  ANESTHESIA:  General, nasal intubation.  DESCRIPTION OF PROCEDURE:  The patient was placed on the table in supine position.  General anesthesia was administered and a nasal endotracheal tube was placed and secured.  The eyes were protected.  The patient was draped.  Time-out was performed.  The posterior pharynx was suctioned and a throat pack was placed.  A 2% lidocaine with 1:100,000 epinephrine was infiltrated in an inferior alveolar block on the right and left sides and buccal and palatal infiltration of the maxilla.  Additionally, anesthetic solution was injected in the uvula.  Then, a 15 blade was used to make an incision around the uvula lesion, which was approximately 5 mm x 5 mm and papillary in nature.  The lesion was removed, submitted for pathology in formalin.  The area was sutured with 3-0 chromic.  Then, a bite block was placed on the right side of the mouth and a sweetheart retractor was used to retract the tongue and then a #15 blade was used to make an incision beginning in the left  mandible around tooth #19 caring forward through 20, 21, 22, 23, 24, 25, 26, in the buccal and lingual sulcus and the left maxilla incision was made beginning around tooth #14, carried forward to tooth #7.  The periosteum was reflected from around these teeth and then the teeth were elevated with a 301 elevator.  Teeth 20, 21, 23, 24, 25 and 26 were removed with the forceps; however, teeth numbers 19 and 22 fractured upon attempted removal.  The bone was removed and the teeth were sectioned using a Stryker handpiece with a fissure bur under irrigation.  Once these 2 teeth were removed, attention was turned to the maxilla.  The 301 was used to elevate the teeth and then the forceps was used to extract teeth numbers 10, 9, 8, and 7.  Teeth numbers 14 and 11 fractured upon attempted removal and bone was removed using the Stryker handpiece under irrigation to allow for a complete removal of these teeth.  Then, the sockets of  the left maxilla and mandible were curetted.  The periosteum was reflected to expose the alveolar crest and then alveoplasty was performed using an egg-shaped bur and bone file.  The areas were then irrigated and closed with 3-0 chromic.  The bite block and sweetheart retractor were repositioned in right side of the mouth.  A #15 blade was used to make an incision around teeth numbers 3 and 6 in the maxilla and around teeth numbers 27, 28, 29 and 30 in the mandible, both buccally and lingually in the mandible and buccally and palatally in the maxilla in the gingival sulcus.  The periosteum was reflected and the teeth were elevated with a 301 elevator.  Then, the dental forceps were used to remove the teeth.  The sockets were curetted.  The periosteum was reflected to expose the alveolar crest and then alveoplasty was performed using the egg-shaped bur and bone file and the right maxilla and mandible.  The areas were then irrigated and closed with 3-0 chromic.  The oral  cavity was inspected and found to have good contour hemostasis and closure.  The oral cavity was irrigated and suctioned. The throat pack was removed.  The patient was awakened, taken to the recovery room, breathing spontaneously in good condition.  EBL was minimum.  Counts were correct.  Specimen was lesion of uvula.  The patient was left in care of anesthesia for awakening and transportation to recovery room.     [name], M.D.     SMJ/MEDQ  D:  12/20/2016  T:  12/20/2016  Job:  [id number]

## 2016-12-20 NOTE — Transfer of Care (Signed)
Immediate Anesthesia Transfer of Care Note  Patient: Douglas Dorsey  Procedure(s) Performed: Procedure(s): MULTIPLE EXTRACTION OF TEETH NUMBER ONE, THREE, SIX, SEVEN, EIGHT, NINE, TEN, ELEVEN, FOURTEEN, NINETEEN, TWENTY, TWENTY-ONE, TWENTY-TWO, TWENTY-THREE, TWENTY-FOUR, TWENTY- FIVE, TWENTY-SIX, TWENTY-SEVEN, TWENTY-EIGHT, TWENTY-NINE, THIRTY  ;   ALVEOLOPLASTY; BIOPSY LESION OF UVULA (Bilateral)  Patient Location: PACU  Anesthesia Type:General  Level of Consciousness: awake, oriented, sedated and patient cooperative  Airway & Oxygen Therapy: Patient Spontanous Breathing and Patient connected to nasal cannula oxygen  Post-op Assessment: Report given to RN, Post -op Vital signs reviewed and stable, Patient moving all extremities and Patient moving all extremities X 4  Post vital signs: Reviewed and stable  Last Vitals:  Vitals:   12/20/16 0544 12/20/16 0845  BP: (!) 134/99 (P) 128/79  Pulse: 90 97  Resp: 20   Temp: 36.6 C 36.5 C    Last Pain:  Vitals:   12/20/16 0548  TempSrc:   PainSc: 6          Complications: No apparent anesthesia complications

## 2016-12-20 NOTE — H&P (Signed)
H&P documentation  -History and Physical Reviewed  -Patient has been re-examined  -No change in the plan of care  Douglas Dorsey  

## 2016-12-20 NOTE — Op Note (Signed)
12/20/2016  8:29 AM  PATIENT:  Douglas Dorsey  41 y.o. male  PRE-OPERATIVE DIAGNOSIS:  NON RESTORABLE TEETH NUMBER ONE, THREE, SIX, SEVEN, EIGHT, NINE, TEN, ELEVEN, FOURTEEN, NINETEEN, TWENTY, TWENTY-ONE, TWENTY-TWO, TWENTY-THREE, TWENTY-FOUR, TWENTY- FIVE, TWENTY-SIX, TWENTY-SEVEN, TWENTY-EIGHT, TWENTY-NINE, THIRTY  ;    LESION OF UVULA   POST-OPERATIVE DIAGNOSIS:  SAME  PROCEDURE:  Procedure(s): MULTIPLE EXTRACTION OF TEETH NUMBER ONE, THREE, SIX, SEVEN, EIGHT, NINE, TEN, ELEVEN, FOURTEEN, NINETEEN, TWENTY, TWENTY-ONE, TWENTY-TWO, TWENTY-THREE, TWENTY-FOUR, TWENTY- FIVE, TWENTY-SIX, TWENTY-SEVEN, TWENTY-EIGHT, TWENTY-NINE, THIRTY  ;   ALVEOLOPLASTY; BIOPSY LESION OF UVULA  SURGEON:  Surgeon(s): Diona Browner, DDS  ANESTHESIA:   local and general  EBL:  minimal  DRAINS: none   SPECIMEN:  Lesion of uvula  COUNTS:  YES  PLAN OF CARE: Discharge to home after PACU  PATIENT DISPOSITION:  PACU - hemodynamically stable.   PROCEDURE DETAILS: Dictation # [id number]  [name], DMD 12/20/2016 8:29 AM

## 2016-12-20 NOTE — Anesthesia Preprocedure Evaluation (Addendum)
Anesthesia Evaluation  Patient identified by MRN, date of birth, ID band Patient awake    Reviewed: Allergy & Precautions, NPO status , Patient's Chart, lab work & pertinent test results  Airway Mallampati: II  TM Distance: >3 FB Neck ROM: Full    Dental  (+) Poor Dentition   Pulmonary Current Smoker (50 pack years),    Pulmonary exam normal breath sounds clear to auscultation       Cardiovascular Exercise Tolerance: Good Normal cardiovascular exam Rhythm:Regular Rate:Normal  ECG: NSR, rate 92   Neuro/Psych negative neurological ROS  negative psych ROS   GI/Hepatic Neg liver ROS, GERD  Medicated and Controlled,  Endo/Other  negative endocrine ROS  Renal/GU negative Renal ROS  negative genitourinary   Musculoskeletal negative musculoskeletal ROS (+)   Abdominal   Peds negative pediatric ROS (+)  Hematology negative hematology ROS (+)   Anesthesia Other Findings obese  Reproductive/Obstetrics negative OB ROS                            Anesthesia Physical Anesthesia Plan  ASA: III  Anesthesia Plan: General   Post-op Pain Management:    Induction: Intravenous  Airway Management Planned: Oral ETT and Nasal ETT  Additional Equipment:   Intra-op Plan:   Post-operative Plan: Extubation in OR  Informed Consent: I have reviewed the patients History and Physical, chart, labs and discussed the procedure including the risks, benefits and alternatives for the proposed anesthesia with the patient or authorized representative who has indicated his/her understanding and acceptance.   Dental advisory given  Plan Discussed with: CRNA and Surgeon  Anesthesia Plan Comments:        Anesthesia Quick Evaluation

## 2016-12-20 NOTE — Anesthesia Procedure Notes (Signed)
Procedure Name: Intubation Date/Time: 12/20/2016 7:34 AM Performed by: Izora Gala Pre-anesthesia Checklist: Patient identified, Emergency Drugs available, Suction available and Patient being monitored Patient Re-evaluated:Patient Re-evaluated prior to inductionOxygen Delivery Method: Circle system utilized Preoxygenation: Pre-oxygenation with 100% oxygen Intubation Type: IV induction Ventilation: Mask ventilation without difficulty Laryngoscope Size: Miller and 3 Grade View: Grade I Nasal Tubes: Right, Nasal prep performed, Nasal Rae and Magill forceps- large, utilized Tube size: 7.5 mm Number of attempts: 1 Airway Equipment and Method: Stylet Placement Confirmation: ETT inserted through vocal cords under direct vision,  positive ETCO2 and breath sounds checked- equal and bilateral Secured at: 27 cm Tube secured with: Tape Dental Injury: Teeth and Oropharynx as per pre-operative assessment

## 2016-12-21 ENCOUNTER — Encounter (HOSPITAL_COMMUNITY): Payer: Self-pay | Admitting: Oral Surgery

## 2016-12-24 ENCOUNTER — Encounter (HOSPITAL_COMMUNITY): Payer: Self-pay | Admitting: Emergency Medicine

## 2016-12-24 ENCOUNTER — Inpatient Hospital Stay (HOSPITAL_COMMUNITY)
Admission: EM | Admit: 2016-12-24 | Discharge: 2016-12-26 | DRG: 684 | Disposition: A | Discharge status: Home / Self Care | Payer: Medicaid Other | Attending: Internal Medicine | Admitting: Internal Medicine

## 2016-12-24 DIAGNOSIS — F17210 Nicotine dependence, cigarettes, uncomplicated: Secondary | ICD-10-CM | POA: Diagnosis present

## 2016-12-24 DIAGNOSIS — K219 Gastro-esophageal reflux disease without esophagitis: Secondary | ICD-10-CM | POA: Diagnosis present

## 2016-12-24 DIAGNOSIS — Z79899 Other long term (current) drug therapy: Secondary | ICD-10-CM

## 2016-12-24 DIAGNOSIS — I10 Essential (primary) hypertension: Secondary | ICD-10-CM | POA: Diagnosis present

## 2016-12-24 DIAGNOSIS — N179 Acute kidney failure, unspecified: Secondary | ICD-10-CM | POA: Diagnosis present

## 2016-12-24 DIAGNOSIS — R252 Cramp and spasm: Secondary | ICD-10-CM | POA: Diagnosis present

## 2016-12-24 DIAGNOSIS — E860 Dehydration: Secondary | ICD-10-CM | POA: Diagnosis present

## 2016-12-24 DIAGNOSIS — N170 Acute kidney failure with tubular necrosis: Principal | ICD-10-CM | POA: Diagnosis present

## 2016-12-24 DIAGNOSIS — E861 Hypovolemia: Secondary | ICD-10-CM | POA: Diagnosis present

## 2016-12-24 LAB — CBC
HCT: 51.9 % (ref 39.0–52.0)
Hemoglobin: 18.6 g/dL — ABNORMAL HIGH (ref 13.0–17.0)
MCH: 30.3 pg (ref 26.0–34.0)
MCHC: 35.8 g/dL (ref 30.0–36.0)
MCV: 84.5 fL (ref 78.0–100.0)
Platelets: 255 10*3/uL (ref 150–400)
RBC: 6.14 MIL/uL — ABNORMAL HIGH (ref 4.22–5.81)
RDW: 14.3 % (ref 11.5–15.5)
WBC: 12.8 10*3/uL — AB (ref 4.0–10.5)

## 2016-12-24 MED ORDER — SODIUM CHLORIDE 0.9 % IV BOLUS (SEPSIS)
2000.0000 mL | Freq: Once | INTRAVENOUS | Status: AC
  Administered 2016-12-25: 2000 mL via INTRAVENOUS

## 2016-12-24 NOTE — ED Triage Notes (Signed)
Pt states he was at work today and started having muscle cramps all over body.  Pt works in extremely hot environment and was sweating profusely

## 2016-12-24 NOTE — ED Provider Notes (Signed)
[place] DEPT Provider Note   CSN: [id number] Arrival date & time: 12/24/16  2217  By signing my name below, I, Dora Sims, attest that this documentation has been prepared under the direction and in the presence of physician practitioner, Jola Schmidt, MD. Electronically Signed: Dora Sims, Scribe. 12/24/2016. 11:42 PM.  History   Chief Complaint Chief Complaint  Patient presents with  . Spasms   The history is provided by the patient. No language interpreter was used.    HPI Comments: Douglas Dorsey is a 41 y.o. male with PMHx including HTN who presents to the Emergency Department complaining of intermittent generalized muscle spasms beginning around 5 PM today while at work. He works as a Glass blower/designer at a [employer] and states the temperature in the facility is typically high. Patient notes it was hot in the facility while he was at work today. He reports he became very diaphoretic in association with his muscle spasms at work today. No alleviating factors noted. Patient had multiple tooth extractions on 12/20/16 and has subsequently had a reduced appetite. He has been trying to hydrate adequately. He denies fevers, chills, nausea, vomiting, or any other associated symptoms.  Past Medical History:  Diagnosis Date  . Complication of anesthesia    pt sts "they told me it took longer for them to wake me up than it should have".  . Family history of coronary artery disease 12/24/2011  . GERD (gastroesophageal reflux disease)   . Hypertension   . Tobacco abuse     Patient Active Problem List   Diagnosis Date Noted  . Hx of colonic polyps   . Barrett's esophagus 02/14/2015  . History of adenomatous polyp of colon 02/14/2015  . Plantar fasciitis 06/23/2014  . Bloody stool 12/28/2013  . GERD (gastroesophageal reflux disease) 12/28/2013  . Ankle sprain 02/08/2012  . Cellulitis of foot 12/24/2011  . Tinea pedis 12/24/2011  . Tobacco abuse 12/24/2011  . Family  history of coronary artery disease 12/24/2011    Past Surgical History:  Procedure Laterality Date  . COLONOSCOPY N/A 01/20/2014   RMR: Colonic polyps -removed as descrbied above. Friable anal canal hemorrhoids  . COLONOSCOPY N/A 02/22/2015   Procedure: COLONOSCOPY;  Surgeon: Daneil Dolin, MD;  Location: [facility];  Service: Endoscopy;  Laterality: N/A;  1400  . ESOPHAGOGASTRODUODENOSCOPY N/A 01/20/2014   ZDG:LOVFIEPP EG junction abnoraml distal esophagus- query  short segment Barretts -status post esophageal biospy. Hiatal hernia. Abnormal gastric mucosa-status post gastric biopsy.   . ESOPHAGOGASTRODUODENOSCOPY N/A 02/22/2015   Procedure: ESOPHAGOGASTRODUODENOSCOPY (EGD);  Surgeon: Daneil Dolin, MD;  Location: [facility];  Service: Endoscopy;  Laterality: N/A;  . HERNIA REPAIR     Umbilical  . INCISIONAL HERNIA REPAIR N/A 12/17/2013   Procedure: LAPAROSCOPIC INCISIONAL HERNIA REPAIR WITH MESH;  Surgeon: Jamesetta So, MD;  Location: [facility] ORS;  Service: General;  Laterality: N/A;  . INSERTION OF MESH N/A 12/17/2013   Procedure: INSERTION OF MESH;  Surgeon: Jamesetta So, MD;  Location: [facility];  Service: General;  Laterality: N/A;  . MULTIPLE EXTRACTIONS WITH ALVEOLOPLASTY Bilateral 12/20/2016   Procedure: MULTIPLE EXTRACTION OF TEETH NUMBER ONE, THREE, SIX, SEVEN, EIGHT, NINE, TEN, ELEVEN, FOURTEEN, NINETEEN, TWENTY, TWENTY-ONE, TWENTY-TWO, TWENTY-THREE, TWENTY-FOUR, TWENTY- FIVE, TWENTY-SIX, TWENTY-SEVEN, TWENTY-EIGHT, TWENTY-NINE, THIRTY  ;   ALVEOLOPLASTY; BIOPSY LESION OF UVULA;  Surgeon: Diona Browner, DDS;  Location: [facility];  Service: Oral Surgery;  Laterality: Bilateral;  . teeth extracted     12 teeth removed  in [facility] Medications    Prior to Admission medications   Medication Sig Start Date End Date Taking? Authorizing Provider  omeprazole (PRILOSEC) 20 MG capsule Take 1 capsule (20 mg total) by mouth daily. Patient taking differently: Take 40 mg by mouth  at bedtime.  01/20/15   Mahala Menghini, PA-C  oxyCODONE-acetaminophen (PERCOCET) 5-325 MG tablet Take 1-2 tablets by mouth every 4 (four) hours as needed. 12/20/16   Diona Browner, DDS    Family History Family History  Problem Relation Age of Onset  . Diabetes Mother   . Hypertension Mother   . Heart failure Father   . Diabetes Brother   . Hypertension Brother   . Lung disease Unknown   . Colon cancer Neg Hx   . Inflammatory bowel disease Neg Hx     Social History Social History  Substance Use Topics  . Smoking status: Current Every Day Smoker    Packs/day: 2.00    Years: 25.00    Types: Cigarettes  . Smokeless tobacco: Never Used  . Alcohol use No     Allergies   No known allergies   Review of Systems Review of Systems All other systems reviewed and are negative for acute change except as noted in the HPI. Physical Exam Updated Vital Signs BP (!) 132/106 (BP Location: Right Arm)   Pulse 96   Temp 97.6 F (36.4 C) (Oral)   Resp 20   Ht 5\' 9"  (1.753 m)   Wt 109.3 kg (241 lb)   SpO2 97%   BMI 35.59 kg/m   Physical Exam  Constitutional: [name] oriented to person, place, and time. He appears well-developed and well-nourished.  HENT:  Head: Normocephalic and atraumatic.  Eyes: EOM are normal.  Neck: Normal range of motion.  Cardiovascular: Normal rate, regular rhythm, normal heart sounds and intact distal pulses.   Pulmonary/Chest: Effort normal and breath sounds normal. No respiratory distress.  Abdominal: Soft. He exhibits no distension. There is no tenderness.  Musculoskeletal: Normal range of motion.  Neurological: [name] alert and oriented to person, place, and time.  Skin: Skin is warm and dry.  Psychiatric: He has a normal mood and affect. Judgment normal.  Nursing note and vitals reviewed.  ED Treatments / Results  Labs (all labs ordered are listed, but only abnormal results are displayed) Labs Reviewed  CBC - Abnormal; Notable for the following:         Result Value   WBC 12.8 (*)    RBC 6.14 (*)    Hemoglobin 18.6 (*)    All other components within normal limits  COMPREHENSIVE METABOLIC PANEL - Abnormal; Notable for the following:    Chloride 94 (*)    Glucose, Bld 100 (*)    BUN 21 (*)    Creatinine, Ser 2.89 (*)    Calcium 10.7 (*)    Total Protein 9.4 (*)    ALT 16 (*)    GFR calc non Af Amer 26 (*)    GFR calc Af Amer 30 (*)    All other components within normal limits  MAGNESIUM  CK   BUN  Date Value Ref Range Status  12/24/2016 21 (H) 6 - 20 mg/dL Final  12/17/2016 17 6 - 20 mg/dL Final  05/14/2016 16 6 - 20 mg/dL Final  12/18/2015 13 6 - 20 mg/dL Final   Creatinine, Ser  Date Value Ref Range Status  12/24/2016 2.89 (H) 0.61 - 1.24  mg/dL Final  12/17/2016 0.95 0.61 - 1.24 mg/dL Final  05/14/2016 0.89 0.61 - 1.24 mg/dL Final  12/18/2015 0.77 0.61 - 1.24 mg/dL Final      EKG  EKG Interpretation  Date/Time:  Tuesday Dec 24 2016 23:54:30 EDT Ventricular Rate:  95 PR Interval:    QRS Duration: 90 QT Interval:  344 QTC Calculation: 433 R Axis:   74 Text Interpretation:  Sinus rhythm No significant change was found Confirmed by Ryszard Socarras  MD, Ahtziri Jeffries ([id number]) on 12/25/2016 12:04:09 AM       Radiology No results found.  Procedures Procedures (including critical care time)  DIAGNOSTIC STUDIES: Oxygen Saturation is 97% on RA, normal by my interpretation.    COORDINATION OF CARE: 11:40 PM Discussed treatment plan with pt at bedside and pt agreed to plan.  Medications Ordered in ED Medications  sodium chloride 0.9 % bolus 2,000 mL (not administered)     Initial Impression / Assessment and Plan / ED Course  I have reviewed the triage vital signs and the nursing notes.  Pertinent labs & imaging results that were available during my care of the patient were reviewed by me and considered in my medical decision making (see chart for details).     Acute kidney injury. Likely combination of NSAIDs,  dehydration and heat exhaustion. Total CK pending now. Fluids given. Will admit for observation. Hemoglobin concentrated  Final Clinical Impressions(s) / ED Diagnoses   Final diagnoses:  None    New Prescriptions New Prescriptions   No medications on file   I personally performed the services described in this documentation, which was scribed in my presence. The recorded information has been reviewed and is accurate.       Jola Schmidt, MD 12/25/16 [phone]

## 2016-12-25 ENCOUNTER — Encounter (HOSPITAL_COMMUNITY): Payer: Self-pay | Admitting: *Deleted

## 2016-12-25 ENCOUNTER — Observation Stay (HOSPITAL_COMMUNITY): Payer: Medicaid Other

## 2016-12-25 DIAGNOSIS — F17210 Nicotine dependence, cigarettes, uncomplicated: Secondary | ICD-10-CM | POA: Diagnosis present

## 2016-12-25 DIAGNOSIS — E861 Hypovolemia: Secondary | ICD-10-CM | POA: Diagnosis present

## 2016-12-25 DIAGNOSIS — E860 Dehydration: Secondary | ICD-10-CM

## 2016-12-25 DIAGNOSIS — N170 Acute kidney failure with tubular necrosis: Secondary | ICD-10-CM | POA: Diagnosis not present

## 2016-12-25 DIAGNOSIS — K219 Gastro-esophageal reflux disease without esophagitis: Secondary | ICD-10-CM | POA: Diagnosis present

## 2016-12-25 DIAGNOSIS — R252 Cramp and spasm: Secondary | ICD-10-CM

## 2016-12-25 DIAGNOSIS — N179 Acute kidney failure, unspecified: Secondary | ICD-10-CM

## 2016-12-25 DIAGNOSIS — I10 Essential (primary) hypertension: Secondary | ICD-10-CM | POA: Diagnosis present

## 2016-12-25 DIAGNOSIS — Z720 Tobacco use: Secondary | ICD-10-CM | POA: Diagnosis not present

## 2016-12-25 DIAGNOSIS — Z79899 Other long term (current) drug therapy: Secondary | ICD-10-CM | POA: Diagnosis not present

## 2016-12-25 LAB — BASIC METABOLIC PANEL
Anion gap: 12 (ref 5–15)
BUN: 21 mg/dL — ABNORMAL HIGH (ref 6–20)
CHLORIDE: 99 mmol/L — AB (ref 101–111)
CO2: 26 mmol/L (ref 22–32)
Calcium: 9.3 mg/dL (ref 8.9–10.3)
Creatinine, Ser: 2.06 mg/dL — ABNORMAL HIGH (ref 0.61–1.24)
GFR calc non Af Amer: 39 mL/min — ABNORMAL LOW (ref >60)
GFR, EST AFRICAN AMERICAN: 45 mL/min — AB (ref >60)
Glucose, Bld: 111 mg/dL — ABNORMAL HIGH (ref 65–99)
POTASSIUM: 3.6 mmol/L (ref 3.5–5.1)
SODIUM: 137 mmol/L (ref 135–145)

## 2016-12-25 LAB — COMPREHENSIVE METABOLIC PANEL
ALK PHOS: 88 U/L (ref 38–126)
ALT: 16 U/L — ABNORMAL LOW (ref 17–63)
AST: 19 U/L (ref 15–41)
Albumin: 4.9 g/dL (ref 3.5–5.0)
Anion gap: 14 (ref 5–15)
BUN: 21 mg/dL — AB (ref 6–20)
CHLORIDE: 94 mmol/L — AB (ref 101–111)
CO2: 27 mmol/L (ref 22–32)
CREATININE: 2.89 mg/dL — AB (ref 0.61–1.24)
Calcium: 10.7 mg/dL — ABNORMAL HIGH (ref 8.9–10.3)
GFR calc Af Amer: 30 mL/min — ABNORMAL LOW (ref >60)
GFR calc non Af Amer: 26 mL/min — ABNORMAL LOW (ref >60)
GLUCOSE: 100 mg/dL — AB (ref 65–99)
Potassium: 4.2 mmol/L (ref 3.5–5.1)
SODIUM: 135 mmol/L (ref 135–145)
Total Bilirubin: 0.6 mg/dL (ref 0.3–1.2)
Total Protein: 9.4 g/dL — ABNORMAL HIGH (ref 6.5–8.1)

## 2016-12-25 LAB — CBC
HEMATOCRIT: 45.8 % (ref 39.0–52.0)
HEMOGLOBIN: 15.9 g/dL (ref 13.0–17.0)
MCH: 29.6 pg (ref 26.0–34.0)
MCHC: 34.7 g/dL (ref 30.0–36.0)
MCV: 85.3 fL (ref 78.0–100.0)
Platelets: 229 10*3/uL (ref 150–400)
RBC: 5.37 MIL/uL (ref 4.22–5.81)
RDW: 14.5 % (ref 11.5–15.5)
WBC: 11.2 10*3/uL — AB (ref 4.0–10.5)

## 2016-12-25 LAB — NA AND K (SODIUM & POTASSIUM), RAND UR
Potassium Urine: 54 mmol/L
Sodium, Ur: 30 mmol/L

## 2016-12-25 LAB — MAGNESIUM: Magnesium: 2.3 mg/dL (ref 1.7–2.4)

## 2016-12-25 LAB — CREATININE, URINE, RANDOM: CREATININE, URINE: 304.15 mg/dL

## 2016-12-25 LAB — CK: Total CK: 147 U/L (ref 49–397)

## 2016-12-25 MED ORDER — NICOTINE 21 MG/24HR TD PT24
21.0000 mg | MEDICATED_PATCH | Freq: Every day | TRANSDERMAL | Status: DC
  Administered 2016-12-25 – 2016-12-26 (×2): 21 mg via TRANSDERMAL
  Filled 2016-12-25 (×2): qty 1

## 2016-12-25 MED ORDER — OXYCODONE-ACETAMINOPHEN 5-325 MG PO TABS
1.0000 | ORAL_TABLET | ORAL | Status: DC | PRN
  Administered 2016-12-25 – 2016-12-26 (×5): 2 via ORAL
  Filled 2016-12-25 (×5): qty 2

## 2016-12-25 MED ORDER — FAMOTIDINE IN NACL 20-0.9 MG/50ML-% IV SOLN
20.0000 mg | Freq: Two times a day (BID) | INTRAVENOUS | Status: DC
  Administered 2016-12-25 – 2016-12-26 (×3): 20 mg via INTRAVENOUS
  Filled 2016-12-25 (×3): qty 50

## 2016-12-25 MED ORDER — SODIUM CHLORIDE 0.9 % IV SOLN
INTRAVENOUS | Status: DC
  Administered 2016-12-25 – 2016-12-26 (×2): via INTRAVENOUS

## 2016-12-25 MED ORDER — SODIUM CHLORIDE 0.9 % IV SOLN
INTRAVENOUS | Status: AC
  Administered 2016-12-25: 04:00:00 via INTRAVENOUS

## 2016-12-25 MED ORDER — ORAL CARE MOUTH RINSE
15.0000 mL | Freq: Two times a day (BID) | OROMUCOSAL | Status: DC
  Administered 2016-12-25 (×2): 15 mL via OROMUCOSAL

## 2016-12-25 NOTE — Progress Notes (Signed)
Pt room and outside hall smells strongly of cigarette smoke. Reviewed hospital's no smoking policy with pt who verbalized understanding. Offered nicotine patch and pt states he does not think it will work but he will try it. Order received for nicotine patch.

## 2016-12-25 NOTE — Progress Notes (Signed)
PROGRESS NOTE    Douglas Dorsey  ZOX:[id number] DOB: 1976/03/21 DOA: 12/24/2016 PCP: Dionisio Paschal, NP    Brief Narrative:  41 yo male presented with the chief complain of cramps. Patient known to have HTN, recent dental extraction on 5/19. Decreased PO intake and use of NSAIDs (800 mg ibuprofen at least 3 times per week). Worsening generalized muscle cramps on the day of admission, unable to continue working. On the initial physical examination, blood pressure 132/106, with HR of 96 and RR 20, afebrile. His mucous membranes were moist, lungs clear to auscultation, with rhythmic S1 and S2, abdomen soft and no lower extremity edema. Serum cr at 2,9. Patient admitted to the hospital with the working diagnosis of AKI.    Assessment & Plan:   Principal Problem:   AKI (acute kidney injury) ([place]) Active Problems:   Dehydration   Cramp in muscle   1. AKI. Suspected pre-renal acute kidney injury, complicated with NSAIDs use. Will continue IV fluids with isotonic saline at 100 cc/hr and will follow on renal panel in am. Follow on urine Na, K and creatinine. Renal ultrasonography. Avoid nephrotoxic medications or hypotension.  2. HTN. Blood pressure systolic 811 to 914, will continue hydration with saline, not on antihypertensive medications.   3. Dental pain. Continue pain control with oxycodone  4. Tobacco abuse. Smoking cessation, patient smokes about 2 packs per day. Will start patient on nicotine replacement with 21 mg nicotine patch.    DVT prophylaxis: SCD Code Status: Full  Family Communication:  Disposition Plan:    Consultants:   None   Procedures:    Antimicrobials:    Subjective: Patient with persistent dental pain, moderate in intensity, difficulty tolerating po, no nausea or vomiting, no dyspnea or chest pain. No fever or chills. Aches and pains with improvement in intensity.   Objective: Vitals:   12/25/16 0100 12/25/16 0130 12/25/16 0311 12/25/16 0613  BP: 132/85  126/88 (!) 143/95 113/76  Pulse: 90 95 90 77  Resp: 17 19 18 18   Temp:   98.5 F (36.9 C) 97.7 F (36.5 C)  TempSrc:   Oral Oral  SpO2: 99% 96% 99% 98%  Weight:   103.6 kg (228 lb 4.8 oz)   Height:   5\' 9"  (1.753 m)     Intake/Output Summary (Last 24 hours) at 12/25/16 0852 Last data filed at 12/25/16 0401  Gross per 24 hour  Intake             2120 ml  Output                0 ml  Net             2120 ml   Filed Weights   12/24/16 2229 12/25/16 0311  Weight: 109.3 kg (241 lb) 103.6 kg (228 lb 4.8 oz)    Examination:  General exam: deconditioned E ENT. No pallor or icterus. Oral mucosa moist.  Respiratory system: Clear to auscultation. Respiratory effort normal. No wheezing, rales or rhonchi. Cardiovascular system: S1 & S2 heard, RRR. No JVD, murmurs, rubs, gallops or clicks. No pedal edema. Gastrointestinal system: Abdomen is nondistended, soft and nontender. No organomegaly or masses felt. Normal bowel sounds heard. Central nervous system: Alert and oriented. No focal neurological deficits. Extremities: Symmetric 5 x 5 power. Skin: No rashes, lesions or ulcers   Data Reviewed: I have personally reviewed following labs and imaging studies  CBC:  Recent Labs Lab 12/24/16 2344 12/25/16 0517  WBC 12.8* 11.2*  HGB 18.6* 15.9  HCT 51.9 45.8  MCV 84.5 85.3  PLT 255 117   Basic Metabolic Panel:  Recent Labs Lab 12/24/16 2344 12/25/16 0517  NA 135 137  K 4.2 3.6  CL 94* 99*  CO2 27 26  GLUCOSE 100* 111*  BUN 21* 21*  CREATININE 2.89* 2.06*  CALCIUM 10.7* 9.3  MG 2.3  --    GFR: Estimated Creatinine Clearance: 57.7 mL/min (A) (by C-G formula based on SCr of 2.06 mg/dL (H)). Liver Function Tests:  Recent Labs Lab 12/24/16 2344  AST 19  ALT 16*  ALKPHOS 88  BILITOT 0.6  PROT 9.4*  ALBUMIN 4.9   No results for input(s): LIPASE, AMYLASE in the last 168 hours. No results for input(s): AMMONIA in the last 168 hours. Coagulation Profile: No results  for input(s): INR, PROTIME in the last 168 hours. Cardiac Enzymes:  Recent Labs Lab 12/24/16 2344  CKTOTAL 147   BNP (last 3 results) No results for input(s): PROBNP in the last 8760 hours. HbA1C: No results for input(s): HGBA1C in the last 72 hours. CBG: No results for input(s): GLUCAP in the last 168 hours. Lipid Profile: No results for input(s): CHOL, HDL, LDLCALC, TRIG, CHOLHDL, LDLDIRECT in the last 72 hours. Thyroid Function Tests: No results for input(s): TSH, T4TOTAL, FREET4, T3FREE, THYROIDAB in the last 72 hours. Anemia Panel: No results for input(s): VITAMINB12, FOLATE, FERRITIN, TIBC, IRON, RETICCTPCT in the last 72 hours. Sepsis Labs: No results for input(s): PROCALCITON, LATICACIDVEN in the last 168 hours.  No results found for this or any previous visit (from the past 240 hour(s)).       Radiology Studies: No results found.      Scheduled Meds: . mouth rinse  15 mL Mouth Rinse BID   Continuous Infusions: . sodium chloride 125 mL/hr at 12/25/16 0401     LOS: 0 days     Tawni Millers, MD [facility] Pager [phone]  If 7PM-7AM, please contact night-coverage [email or web address] Password [facility] 12/25/2016, 8:52 AM

## 2016-12-25 NOTE — ED Notes (Signed)
Report given to [place], RN 300

## 2016-12-25 NOTE — H&P (Signed)
History and Physical    Douglas Dorsey:[id number] DOB: 05/07/1976 DOA: 12/24/2016  PCP: Dionisio Paschal, NP  Patient coming from:  home  Chief Complaint:   cramps  HPI: Douglas Dorsey is a 41 y.o. male with medical history significant of HTN, s/p complete dental extraction of all teeth Friday (4 days ago) with recent decreased po intake due to pain.  Taking 800mg   of ibu at home twice a day or more.  Went to work today in the heat and started not feeling well.  With a lot of muscle cramps and aches.  No dizziness or LOC>  No fevers.  Pt found to have AKI and referred for admission for ivf.   Review of Systems: As per HPI otherwise 10 point review of systems negative.   Past Medical History:  Diagnosis Date  . Complication of anesthesia    pt sts "they told me it took longer for them to wake me up than it should have".  . Family history of coronary artery disease 12/24/2011  . GERD (gastroesophageal reflux disease)   . Hypertension   . Tobacco abuse     Past Surgical History:  Procedure Laterality Date  . COLONOSCOPY N/A 01/20/2014   RMR: Colonic polyps -removed as descrbied above. Friable anal canal hemorrhoids  . COLONOSCOPY N/A 02/22/2015   Procedure: COLONOSCOPY;  Surgeon: Daneil Dolin, MD;  [facility];  Service: Endoscopy;  Laterality: N/A;  1400  . ESOPHAGOGASTRODUODENOSCOPY N/A 01/20/2014   YQI:[id number] EG junction abnoraml distal esophagus- query  short segment Barretts -status post esophageal biospy. Hiatal hernia. Abnormal gastric mucosa-status post gastric biopsy.   . ESOPHAGOGASTRODUODENOSCOPY N/A 02/22/2015   Procedure: ESOPHAGOGASTRODUODENOSCOPY (EGD);  Surgeon: Daneil Dolin, MD;  Location[facility];  Service: Endoscopy;  Laterality: N/A;  . HERNIA REPAIR     Umbilical  . INCISIONAL HERNIA REPAIR N/A 12/17/2013   Procedure: LAPAROSCOPIC INCISIONAL HERNIA REPAIR WITH MESH;  Surgeon: Jamesetta So, MD;  Location: [facility];  Service: General;  Laterality:  N/A;  . INSERTION OF MESH N/A 12/17/2013   Procedure: INSERTION OF MESH;  Surgeon: Jamesetta So, MD;  Location: [facility];  Service: General;  Laterality: N/A;  . MULTIPLE EXTRACTIONS WITH ALVEOLOPLASTY Bilateral 12/20/2016   Procedure: MULTIPLE EXTRACTION OF TEETH NUMBER ONE, THREE, SIX, SEVEN, EIGHT, NINE, TEN, ELEVEN, FOURTEEN, NINETEEN, TWENTY, TWENTY-ONE, TWENTY-TWO, TWENTY-THREE, TWENTY-FOUR, TWENTY- FIVE, TWENTY-SIX, TWENTY-SEVEN, TWENTY-EIGHT, TWENTY-NINE, THIRTY  ;   ALVEOLOPLASTY; BIOPSY LESION OF UVULA;  Surgeon: Diona Browner, DDS;  Location: [facility];  Service: Oral Surgery;  Laterality: Bilateral;  . teeth extracted     12 teeth removed in [place]     reports that he has been smoking Cigarettes.  He has a 50.00 pack-year smoking history. He has never used smokeless tobacco. He reports that he does not drink alcohol or use drugs.  Allergies  Allergen Reactions  . No Known Allergies     Family History  Problem Relation Age of Onset  . Diabetes Mother   . Hypertension Mother   . Heart failure Father   . Diabetes Brother   . Hypertension Brother   . Lung disease Unknown   . Colon cancer Neg Hx   . Inflammatory bowel disease Neg Hx     Prior to Admission medications   Medication Sig Start Date End Date Taking? Authorizing Provider  omeprazole (PRILOSEC) 20 MG capsule Take 1 capsule (20 mg total) by mouth daily. Patient taking differently: Take 40 mg  by mouth at bedtime.  01/20/15   Mahala Menghini, PA-C  oxyCODONE-acetaminophen (PERCOCET) 5-325 MG tablet Take 1-2 tablets by mouth every 4 (four) hours as needed. 12/20/16   Diona Browner, DDS    Physical Exam: Vitals:   12/24/16 2229 12/24/16 2230  BP:  (!) 132/106  Pulse:  96  Resp:  20  Temp:  97.6 F (36.4 C)  TempSrc:  Oral  SpO2:  97%  Weight: 109.3 kg (241 lb)   Height: 5\' 9"  (1.753 m)     Constitutional: NAD, calm, comfortable Vitals:   12/24/16 2229 12/24/16 2230  BP:  (!) 132/106  Pulse:  96  Resp:  20    Temp:  97.6 F (36.4 C)  TempSrc:  Oral  SpO2:  97%  Weight: 109.3 kg (241 lb)   Height: 5\' 9"  (1.753 m)    Eyes: PERRL, lids and conjunctivae normal ENMT: Mucous membranes are moist. Posterior pharynx clear of any exudate or lesions.Normal dentition.  Neck: normal, supple, no masses, no thyromegaly Respiratory: clear to auscultation bilaterally, no wheezing, no crackles. Normal respiratory effort. No accessory muscle use.  Cardiovascular: Regular rate and rhythm, no murmurs / rubs / gallops. No extremity edema. 2+ pedal pulses. No carotid bruits.  Abdomen: no tenderness, no masses palpated. No hepatosplenomegaly. Bowel sounds positive.  Musculoskeletal: no clubbing / cyanosis. No joint deformity upper and lower extremities. Good ROM, no contractures. Normal muscle tone.  Skin: no rashes, lesions, ulcers. No induration Neurologic: CN 2-12 grossly intact. Sensation intact, DTR normal. Strength 5/5 in all 4.  Psychiatric: Normal judgment and insight. Alert and oriented x 3. Normal mood.    Labs on Admission: I have personally reviewed following labs and imaging studies  CBC:  Recent Labs Lab 12/24/16 2344  WBC 12.8*  HGB 18.6*  HCT 51.9  MCV 84.5  PLT 284   Basic Metabolic Panel:  Recent Labs Lab 12/24/16 2344  NA 135  K 4.2  CL 94*  CO2 27  GLUCOSE 100*  BUN 21*  CREATININE 2.89*  CALCIUM 10.7*  MG 2.3   GFR: Estimated Creatinine Clearance: 42.2 mL/min (A) (by C-G formula based on SCr of 2.89 mg/dL (H)). Liver Function Tests:  Recent Labs Lab 12/24/16 2344  AST 19  ALT 16*  ALKPHOS 88  BILITOT 0.6  PROT 9.4*  ALBUMIN 4.9   Cardiac Enzymes:  Recent Labs Lab 12/24/16 2344  CKTOTAL 147    Radiological Exams on Admission: No results found.   Assessment/Plan 41 yo male with AKI likely due to prerenal azotemia and ATN  Principal Problem:   AKI (acute kidney injury) ([place])- likely due to dehydration and NSAID usage recently.  Cr at 2.9 from normal  range of 0.9 in the last year.  Avoid all nephrotoxic substances.  Given 2 liters ivf in ED, will cont ns at 125 /hour overnight and repeat bmp in the am.  His renal function will likely markedly improve by then.  cpk level is normal.  Lytes are normal.  Check mag level pending.  Active Problems:   Dehydration- noted, as above   Cramp in muscle- due to dehydration, as above     DVT prophylaxis: scds Code Status:  full Family Communication:  none Disposition Plan:  Per day team Consults called:  none Admission status:  observation   DAVID,RACHAL A MD [facility]  If 7PM-7AM, please contact night-coverage [email or web address] Password [facility]  12/25/2016, 12:54 AM

## 2016-12-26 LAB — BASIC METABOLIC PANEL
Anion gap: 3 — ABNORMAL LOW (ref 5–15)
BUN: 16 mg/dL (ref 6–20)
CALCIUM: 8.9 mg/dL (ref 8.9–10.3)
CO2: 29 mmol/L (ref 22–32)
CREATININE: 0.93 mg/dL (ref 0.61–1.24)
Chloride: 102 mmol/L (ref 101–111)
Glucose, Bld: 115 mg/dL — ABNORMAL HIGH (ref 65–99)
Potassium: 4.3 mmol/L (ref 3.5–5.1)
SODIUM: 134 mmol/L — AB (ref 135–145)

## 2016-12-26 MED ORDER — RANITIDINE HCL 150 MG PO TABS: 150.0000 mg | ORAL_TABLET | Freq: Two times a day (BID) | ORAL | 1 refills | Status: DC

## 2016-12-26 MED ORDER — TRAMADOL HCL 50 MG PO TABS: 50.0000 mg | ORAL_TABLET | Freq: Three times a day (TID) | ORAL | 0 refills | Status: DC | PRN

## 2016-12-26 NOTE — Progress Notes (Signed)
Douglas Dorsey discharged Home per MD order.  Discharge instructions reviewed and discussed with the patient, all questions and concerns answered. Copy of instructions and scripts given to patient.  Allergies as of 12/26/2016      Reactions   No Known Allergies       Medication List    STOP taking these medications   ibuprofen 800 MG tablet Commonly known as:  ADVIL,MOTRIN   omeprazole 20 MG capsule Commonly known as:  PRILOSEC   oxyCODONE-acetaminophen 5-325 MG tablet Commonly known as:  PERCOCET     TAKE these medications   ranitidine 150 MG tablet Commonly known as:  ZANTAC Take 1 tablet (150 mg total) by mouth 2 (two) times daily.   traMADol 50 MG tablet Commonly known as:  ULTRAM Take 1 tablet (50 mg total) by mouth every 8 (eight) hours as needed for severe pain.       Patients skin is clean, dry and intact, no evidence of skin break down. IV site discontinued and catheter remains intact. Site without signs and symptoms of complications. Dressing and pressure applied.  Patient escorted to car by NT in a wheelchair,  no distress noted upon discharge.  Douglas Dorsey 12/26/2016 1:39 PM

## 2016-12-26 NOTE — Care Management Note (Signed)
Case Management Note  Patient Details  Name: EMILO GRAS MRN: [id number] Date of Birth: 1976/03/01  Subjective/Objective:                  Pt admitted with AKI. Chart reviewed for CM needs. Pt is from home, lives with family and ind with ADL's. He has PCP, transportation and insurance with drug coverage.   Action/Plan: Pt discharging home today with self care. No CM needs anticipated.   Expected Discharge Date:  12/26/16               Expected Discharge Plan:  Home/Self Care  In-House Referral:  NA  Discharge planning Services  NA  Post Acute Care Choice:  NA Choice offered to:  NA  Status of Service:  Completed, signed off Sherald Barge, RN 12/26/2016, 12:07 PM

## 2016-12-26 NOTE — Discharge Summary (Signed)
Physician Discharge Summary  Douglas Dorsey YQM:[id number] DOB: [birth year] DOA: 12/24/2016  PCP: Dionisio Paschal, NP  Admit date: 12/24/2016 Discharge date: 12/26/2016  Admitted From: Home  Disposition:  Home   Recommendations for Outpatient Follow-up:  1. Follow up with PCP in 1- week 2. Patient was instructed to avoid NSAIDs 3. Placed on tramadol for pain control 4. Patient has been referred to the outpatient clinic for reevaluation of his blood pressure.   Home Health: No  Equipment/Devices: No   Discharge Condition: Stable  CODE STATUS: Full  Diet recommendation: Heart healthy  Brief/Interim Summary: 41 yo male presented with the chief complain of cramps. Patient known to have HTN, recent dental extraction on 5/19. Decreased PO intake and positive use of NSAIDs (800 mg ibuprofen at least 3 times per day). Worsening generalized muscle cramps on the day of admission, unable to continue working. On the initial physical examination, blood pressure 132/106, with HR of 96 and RR 20, afebrile. His mucous membranes were moist, lungs clear to auscultation, with rhythmic S1 and S2, abdomen soft and no lower extremity edema. Sodium 135, potassium 4.2, chloride 94, bicarbonate 27, glucose 100, BUN 21, creatinine 2.89, white count 12.8, hemoglobin 18.6, hematocrit 51.9, platelets 255, CPK 147. EKG sinus tachycardia with a rate of 90 bpm, normal intervals. Normal axis.  Patient admitted to the hospital with the working diagnosis of AKI, due to nonsteroidal anti-inflammatory agents use and dehydration.  1. Acute kidney injury. Combination of pre-renal injury and use of nonsteroidal anti-inflammatory agents. Patient was admitted to medical floor, placed on a remote telemetry monitor, he received aggressive IV fluids with isotonic saline, with improvement of his kidney function. His discharge creatinine is 0.93. As part of the workup renal ultrasonography was performed, showed no hydronephrosis.  His urinary  sodium was 30 suggesting a prerenal feature. Fraction excretion of sodium was less than 1. Avoid use of NSAIDs, will change pantoprazole to ranitidine, to avoid any kidney toxicity.    2. Hypertension. Antihypertensive agents were held due to hypovolemia and risk of hypotension. His systolic blood pressure has remained mainly below 120. Will recommend to follow-up as an outpatient. He has been recommended by his personal physician, lifestyle modification, salt restriction, and close follow-up. At this point patient wishes to continue this plan of care and hold on antihypertensive medications for now. I have advised him to seek immediate medical attention if the blood pressure persistently high above 140/90.  3. Dental pain. Clinically improving, patient able to tolerate by mouth adequately, patient will be discharged on tramadol as needed, avoid NSAIDs.  4. Tobacco abuse. Smoking cessation counseling.   Discharge Diagnoses:  Principal Problem:   AKI (acute kidney injury) ([place]) Active Problems:   Dehydration   Cramp in muscle    Discharge Instructions   Allergies as of 12/26/2016      Reactions   No Known Allergies       Medication List    STOP taking these medications   ibuprofen 800 MG tablet Commonly known as:  ADVIL,MOTRIN   omeprazole 20 MG capsule Commonly known as:  PRILOSEC   oxyCODONE-acetaminophen 5-325 MG tablet Commonly known as:  PERCOCET     TAKE these medications   ranitidine 150 MG tablet Commonly known as:  ZANTAC Take 1 tablet (150 mg total) by mouth 2 (two) times daily.   traMADol 50 MG tablet Commonly known as:  ULTRAM Take 1 tablet (50 mg total) by mouth every 8 (eight) hours as needed for severe  pain.      Follow-up Information    Dionisio Paschal, NP.   Specialty:  Nurse Practitioner         Allergies  Allergen Reactions  . No Known Allergies     Consultations:    Procedures/Studies: US Renal  Result Date: 12/25/2016 CLINICAL  DATA:  Acute kidney injury, dehydration EXAM: RENAL / URINARY TRACT ULTRASOUND COMPLETE COMPARISON:  CT 05/14/2016 FINDINGS: Right Kidney: Length: 10.3 cm. Echogenicity within normal limits. No mass or hydronephrosis visualized. Left Kidney: Length: 12.5 cm. Echogenicity within normal limits. No mass or hydronephrosis visualized. Bladder: Appears normal for degree of bladder distention. IMPRESSION: No acute findings.  No hydronephrosis. Electronically Signed   By: Rolm Baptise M.D.   On: 12/25/2016 11:15       Subjective: Patient feeling better, no nausea or vomiting, dental pain has improved. No chest pain or dyspnea.   Discharge Exam: Vitals:   12/25/16 2100 12/26/16 0643  BP: 119/73 (!) 152/86  Pulse: 78 82  Resp: 18 18  Temp: 98.6 F (37 C) 97.7 F (36.5 C)   Vitals:   12/25/16 0311 12/25/16 0613 12/25/16 2100 12/26/16 0643  BP: (!) 143/95 113/76 119/73 (!) 152/86  Pulse: 90 77 78 82  Resp: 18 18 18 18   Temp: 98.5 F (36.9 C) 97.7 F (36.5 C) 98.6 F (37 C) 97.7 F (36.5 C)  TempSrc: Oral Oral Oral Oral  SpO2: 99% 98% 97% 99%  Weight: 103.6 kg (228 lb 4.8 oz)     Height: 5\' 9"  (1.753 m)       General: Pt is alert, awake, not in acute distress E ENT: no pallor or icterus, oral mucosa moist.  Cardiovascular: RRR, S1/S2 +, no rubs, no gallops Respiratory: CTA bilaterally, no wheezing, no rhonchi Abdominal: Soft, NT, ND, bowel sounds + Extremities: no edema, no cyanosis    The results of significant diagnostics from this hospitalization (including imaging, microbiology, ancillary and laboratory) are listed below for reference.     Microbiology: No results found for this or any previous visit (from the past 240 hour(s)).   Labs: BNP (last 3 results) No results for input(s): BNP in the last 8760 hours. Basic Metabolic Panel:  Recent Labs Lab 12/24/16 2344 12/25/16 0517  NA 135 137  K 4.2 3.6  CL 94* 99*  CO2 27 26  GLUCOSE 100* 111*  BUN 21* 21*   CREATININE 2.89* 2.06*  CALCIUM 10.7* 9.3  MG 2.3  --    Liver Function Tests:  Recent Labs Lab 12/24/16 2344  AST 19  ALT 16*  ALKPHOS 88  BILITOT 0.6  PROT 9.4*  ALBUMIN 4.9   No results for input(s): LIPASE, AMYLASE in the last 168 hours. No results for input(s): AMMONIA in the last 168 hours. CBC:  Recent Labs Lab 12/24/16 2344 12/25/16 0517  WBC 12.8* 11.2*  HGB 18.6* 15.9  HCT 51.9 45.8  MCV 84.5 85.3  PLT 255 229   Cardiac Enzymes:  Recent Labs Lab 12/24/16 2344  CKTOTAL 147   BNP: Invalid input(s): POCBNP CBG: No results for input(s): GLUCAP in the last 168 hours. D-Dimer No results for input(s): DDIMER in the last 72 hours. Hgb A1c No results for input(s): HGBA1C in the last 72 hours. Lipid Profile No results for input(s): CHOL, HDL, LDLCALC, TRIG, CHOLHDL, LDLDIRECT in the last 72 hours. Thyroid function studies No results for input(s): TSH, T4TOTAL, T3FREE, THYROIDAB in the last 72 hours.  Invalid input(s): FREET3 Anemia work up  No results for input(s): VITAMINB12, FOLATE, FERRITIN, TIBC, IRON, RETICCTPCT in the last 72 hours. Urinalysis    Component Value Date/Time   COLORURINE YELLOW 05/14/2016 [place] 05/14/2016 1046   LABSPEC >1.030 (H) 05/14/2016 1046   PHURINE 5.5 05/14/2016 1046   GLUCOSEU NEGATIVE 05/14/2016 1046   HGBUR TRACE (A) 05/14/2016 1046   BILIRUBINUR NEGATIVE 05/14/2016 1046   KETONESUR NEGATIVE 05/14/2016 1046   PROTEINUR NEGATIVE 05/14/2016 1046   UROBILINOGEN 0.2 04/09/2010 2332   NITRITE NEGATIVE 05/14/2016 1046   LEUKOCYTESUR NEGATIVE 05/14/2016 1046   Sepsis Labs Invalid input(s): PROCALCITONIN,  WBC,  LACTICIDVEN Microbiology No results found for this or any previous visit (from the past 240 hour(s)).   Time coordinating discharge: 45 minutes  SIGNED:   Tawni Millers, MD  [facility] 12/26/2016, 10:20 AM Pager   If 7PM-7AM, please contact  night-coverage [email or web address] Password [email or web address]

## 2017-08-18 ENCOUNTER — Encounter (HOSPITAL_COMMUNITY): Payer: Self-pay | Admitting: Emergency Medicine

## 2017-08-18 ENCOUNTER — Emergency Department (HOSPITAL_COMMUNITY)
Admission: EM | Admit: 2017-08-18 | Discharge: 2017-08-18 | Disposition: A | Discharge status: Home / Self Care | Payer: Medicaid Other | Attending: Emergency Medicine | Admitting: Emergency Medicine

## 2017-08-18 ENCOUNTER — Other Ambulatory Visit: Payer: Self-pay

## 2017-08-18 DIAGNOSIS — F17210 Nicotine dependence, cigarettes, uncomplicated: Secondary | ICD-10-CM | POA: Diagnosis not present

## 2017-08-18 DIAGNOSIS — J069 Acute upper respiratory infection, unspecified: Secondary | ICD-10-CM | POA: Diagnosis not present

## 2017-08-18 DIAGNOSIS — H6502 Acute serous otitis media, left ear: Secondary | ICD-10-CM | POA: Insufficient documentation

## 2017-08-18 DIAGNOSIS — I10 Essential (primary) hypertension: Secondary | ICD-10-CM | POA: Diagnosis not present

## 2017-08-18 DIAGNOSIS — B9789 Other viral agents as the cause of diseases classified elsewhere: Secondary | ICD-10-CM

## 2017-08-18 DIAGNOSIS — Z79899 Other long term (current) drug therapy: Secondary | ICD-10-CM | POA: Diagnosis not present

## 2017-08-18 DIAGNOSIS — R05 Cough: Secondary | ICD-10-CM | POA: Diagnosis present

## 2017-08-18 MED ORDER — PROMETHAZINE-DM 6.25-15 MG/5ML PO SYRP: 5.0000 mL | ORAL_SOLUTION | Freq: Four times a day (QID) | ORAL | 0 refills | Status: DC | PRN

## 2017-08-18 MED ORDER — AMOXICILLIN-POT CLAVULANATE 875-125 MG PO TABS: 1.0000 | ORAL_TABLET | Freq: Two times a day (BID) | ORAL | 0 refills | Status: AC

## 2017-08-18 NOTE — ED Triage Notes (Signed)
PT c/o bilateral ear pain, nasal congestion, cough and sore throat x3 days unrelieved by OTC mucinex, robitussin, tylenol etc.

## 2017-08-18 NOTE — Discharge Instructions (Signed)
As discussed, take entire course of antibiotics even if you feel better.  Make sure that you drink plenty of fluids and stay well-hydrated.  Take your cough medicine as needed and avoid driving while on this medication.  Follow up with your primary care provider.  Return sooner if symptoms worsen, chest pain, difficulty breathing, difficulty swallowing or new concerning symptoms in the meantime.

## 2017-08-18 NOTE — ED Provider Notes (Signed)
[facility] EMERGENCY DEPARTMENT Provider Note   CSN: [id number] Arrival date & time: 08/18/17  1237     History   Chief Complaint Chief Complaint  Patient presents with  . Sore Throat    HPI Douglas Dorsey is a 42 y.o. male with past medical history significant for hypertension, GERD and tobacco abuse presenting with 3 days of upper respiratory infection symptoms including cough, sore throat, congestion, subjective fever and chills and bilateral otalgia.  He denies history of known ill contacts.  Has not received a flu shot this year.  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  Patient has young children at home but no one else is sick. Reports 2 pack a day smoking history for the last 15-20 years.  HPI  Past Medical History:  Diagnosis Date  . Complication of anesthesia    pt sts "they told me it took longer for them to wake me up than it should have".  . Family history of coronary artery disease 12/24/2011  . GERD (gastroesophageal reflux disease)   . Hypertension   . Tobacco abuse     Patient Active Problem List   Diagnosis Date Noted  . AKI (acute kidney injury) ([place]) 12/25/2016  . Dehydration 12/25/2016  . Cramp in muscle 12/25/2016  . Hx of colonic polyps   . Barrett's esophagus 02/14/2015  . History of adenomatous polyp of colon 02/14/2015  . Plantar fasciitis 06/23/2014  . Bloody stool 12/28/2013  . GERD (gastroesophageal reflux disease) 12/28/2013  . Ankle sprain 02/08/2012  . Cellulitis of foot 12/24/2011  . Tinea pedis 12/24/2011  . Tobacco abuse 12/24/2011  . Family history of coronary artery disease 12/24/2011    Past Surgical History:  Procedure Laterality Date  . COLONOSCOPY N/A 01/20/2014   RMR: Colonic polyps -removed as descrbied above. Friable anal canal hemorrhoids  . COLONOSCOPY N/A 02/22/2015   Procedure: COLONOSCOPY;  Surgeon: Daneil Dolin, MD;  [facility];  Service: Endoscopy;  Laterality: N/A;  1400  .  ESOPHAGOGASTRODUODENOSCOPY N/A 01/20/2014   [id number] EG junction abnoraml distal esophagus- query  short segment Barretts -status post esophageal biospy. Hiatal hernia. Abnormal gastric mucosa-status post gastric biopsy.   . ESOPHAGOGASTRODUODENOSCOPY N/A 02/22/2015   Procedure: ESOPHAGOGASTRODUODENOSCOPY (EGD);  Surgeon: Daneil Dolin, MD;  [facility];  Service: Endoscopy;  Laterality: N/A;  . HERNIA REPAIR     Umbilical  . INCISIONAL HERNIA REPAIR N/A 12/17/2013   Procedure: LAPAROSCOPIC INCISIONAL HERNIA REPAIR WITH MESH;  Surgeon: Jamesetta So, MD;  Location: [facility] ORS;  Service: General;  Laterality: N/A;  . INSERTION OF MESH N/A 12/17/2013   Procedure: INSERTION OF MESH;  Surgeon: Jamesetta So, MD;  Location: [facility] ORS;  Service: General;  Laterality: N/A;  . MULTIPLE EXTRACTIONS WITH ALVEOLOPLASTY Bilateral 12/20/2016   Procedure: MULTIPLE EXTRACTION OF TEETH NUMBER ONE, THREE, SIX, SEVEN, EIGHT, NINE, TEN, ELEVEN, FOURTEEN, NINETEEN, TWENTY, TWENTY-ONE, TWENTY-TWO, TWENTY-THREE, TWENTY-FOUR, TWENTY- FIVE, TWENTY-SIX, TWENTY-SEVEN, TWENTY-EIGHT, TWENTY-NINE, THIRTY  ;   ALVEOLOPLASTY; BIOPSY LESION OF UVULA;  Surgeon: Diona Browner, DDS;  Location: [place];  Service: Oral Surgery;  Laterality: Bilateral;  . MULTIPLE TOOTH EXTRACTIONS    . teeth extracted     12 teeth removed in [facility] Medications    Prior to Admission medications   Medication Sig Start Date End Date Taking? Authorizing Provider  amoxicillin-clavulanate (AUGMENTIN) 875-125 MG tablet Take 1 tablet by mouth 2 (two) times daily  for 7 days. 08/18/17 08/25/17  Emeline General, PA-C  promethazine-dextromethorphan (PROMETHAZINE-DM) 6.25-15 MG/5ML syrup Take 5 mLs by mouth 4 (four) times daily as needed for cough. 08/18/17   Emeline General, PA-C  ranitidine (ZANTAC) 150 MG tablet Take 1 tablet (150 mg total) by mouth 2 (two) times daily. 12/26/16 12/26/17  Arrien, Jimmy Picket, MD  traMADol  (ULTRAM) 50 MG tablet Take 1 tablet (50 mg total) by mouth every 8 (eight) hours as needed for severe pain. 12/26/16   Arrien, Jimmy Picket, MD    Family History Family History  Problem Relation Age of Onset  . Diabetes Mother   . Hypertension Mother   . Heart failure Father   . Diabetes Brother   . Hypertension Brother   . Lung disease Unknown   . Colon cancer Neg Hx   . Inflammatory bowel disease Neg Hx     Social History Social History   Tobacco Use  . Smoking status: Current Every Day Smoker    Packs/day: 2.00    Years: 25.00    Pack years: 50.00    Types: Cigarettes  . Smokeless tobacco: Never Used  Substance Use Topics  . Alcohol use: No  . Drug use: No     Allergies   No known allergies   Review of Systems Review of Systems  Constitutional: Positive for chills and fever.  HENT: Positive for congestion, ear pain and sore throat. Negative for ear discharge, facial swelling, sinus pressure, sinus pain, tinnitus, trouble swallowing and voice change.   Eyes: Negative for photophobia, redness and visual disturbance.  Respiratory: Positive for cough. Negative for choking, chest tightness, shortness of breath, wheezing and stridor.   Cardiovascular: Negative for chest pain and palpitations.  Gastrointestinal: Negative for abdominal pain, diarrhea, nausea and vomiting.  Genitourinary: Negative for difficulty urinating.  Musculoskeletal: Negative for myalgias, neck pain and neck stiffness.  Skin: Negative for color change, pallor and rash.  Neurological: Negative for dizziness, light-headedness and headaches.     Physical Exam Updated Vital Signs BP (!) 150/91 (BP Location: Left Arm)   Pulse (!) 101   Temp 98.2 F (36.8 C) (Oral)   Resp 18   Ht 5\' 9"  (1.753 m)   Wt 108.9 kg (240 lb)   SpO2 100%   BMI 35.44 kg/m   Physical Exam  Constitutional: He appears well-developed and well-nourished.  Non-toxic appearance. He does not appear ill. No distress.    Afebrile, nontoxic-appearing, sitting comfortably in bed no acute distress.  HENT:  Head: Normocephalic and atraumatic.  Right Ear: Ear canal normal. No drainage. No middle ear effusion.  Left Ear: Ear canal normal. No drainage. A middle ear effusion is present.  Mouth/Throat: Uvula is midline, oropharynx is clear and moist and mucous membranes are normal. Mucous membranes are not pale. No oral lesions. No uvula swelling. No oropharyngeal exudate, posterior oropharyngeal erythema or tonsillar abscesses. No tonsillar exudate.  Right tympanic membrane erythematous, left tympanic membrane with effusion and bulging  Eyes: Conjunctivae and EOM are normal.  Neck: Normal range of motion. Neck supple.  Cardiovascular: Normal rate, regular rhythm and normal heart sounds.  No murmur heard. Pulmonary/Chest: Effort normal and breath sounds normal. No stridor. No respiratory distress. He has no wheezes. He has no rhonchi. He has no rales.  Abdominal: He exhibits no distension.  Musculoskeletal: He exhibits no edema.  Lymphadenopathy:    He has cervical adenopathy.  Neurological: [name] alert.  Skin: Skin is warm and dry. No  rash noted. [name] not diaphoretic. No erythema. No pallor.  Psychiatric: He has a normal mood and affect.  Nursing note and vitals reviewed.    ED Treatments / Results  Labs (all labs ordered are listed, but only abnormal results are displayed) Labs Reviewed - No data to display  EKG  EKG Interpretation None       Radiology No results found.  Procedures Procedures (including critical care time)  Medications Ordered in ED Medications - No data to display   Initial Impression / Assessment and Plan / ED Course  I have reviewed the triage vital signs and the nursing notes.  Pertinent labs & imaging results that were available during my care of the patient were reviewed by me and considered in my medical decision making (see chart for details).     Patient  presenting with 3 days of upper respiratory symptoms and left otitis media on exam. Lungs CTa bilaterally. Patients symptoms are consistent with URI, likely viral etiology.    Discharge home with antibiotics, symptomatic relief and close follow-up with PCP.  Pt will be discharged with symptomatic treatment.  Verbalizes understanding and is agreeable with plan. Pt is hemodynamically stable & in NAD prior to dc.  Discussed the importance of smoking cessation with patient.  Discussed strict return precautions and advised to return to the emergency department if experiencing any new or worsening symptoms. Instructions were understood and patient agreed with discharge plan.  Final Clinical Impressions(s) / ED Diagnoses   Final diagnoses:  Viral URI with cough  Acute serous otitis media of left ear, recurrence not specified    ED Discharge Orders        Ordered    amoxicillin-clavulanate (AUGMENTIN) 875-125 MG tablet  2 times daily     08/18/17 1426    promethazine-dextromethorphan (PROMETHAZINE-DM) 6.25-15 MG/5ML syrup  4 times daily PRN     08/18/17 1426       Dossie Der 08/18/17 1456    Nat Christen, MD 08/19/17 2023

## 2017-12-12 ENCOUNTER — Other Ambulatory Visit: Payer: Self-pay

## 2017-12-12 ENCOUNTER — Emergency Department (HOSPITAL_COMMUNITY): Payer: Self-pay

## 2017-12-12 ENCOUNTER — Encounter (HOSPITAL_COMMUNITY): Payer: Self-pay | Admitting: *Deleted

## 2017-12-12 ENCOUNTER — Emergency Department (HOSPITAL_COMMUNITY)
Admission: EM | Admit: 2017-12-12 | Discharge: 2017-12-12 | Disposition: A | Discharge status: Home / Self Care | Payer: Self-pay | Attending: Emergency Medicine | Admitting: Emergency Medicine

## 2017-12-12 DIAGNOSIS — M25471 Effusion, right ankle: Secondary | ICD-10-CM | POA: Insufficient documentation

## 2017-12-12 DIAGNOSIS — F17210 Nicotine dependence, cigarettes, uncomplicated: Secondary | ICD-10-CM | POA: Insufficient documentation

## 2017-12-12 DIAGNOSIS — I10 Essential (primary) hypertension: Secondary | ICD-10-CM | POA: Insufficient documentation

## 2017-12-12 MED ORDER — HYDROCODONE-ACETAMINOPHEN 5-325 MG PO TABS
1.0000 | ORAL_TABLET | Freq: Once | ORAL | Status: AC
  Administered 2017-12-12: 1 via ORAL
  Filled 2017-12-12: qty 1

## 2017-12-12 MED ORDER — CEPHALEXIN 500 MG PO CAPS: 500.0000 mg | ORAL_CAPSULE | Freq: Four times a day (QID) | ORAL | 0 refills | Status: DC

## 2017-12-12 MED ORDER — NAPROXEN 500 MG PO TABS: 500.0000 mg | ORAL_TABLET | Freq: Two times a day (BID) | ORAL | 0 refills | Status: DC

## 2017-12-12 MED ORDER — SULFAMETHOXAZOLE-TRIMETHOPRIM 800-160 MG PO TABS: 1.0000 | ORAL_TABLET | Freq: Two times a day (BID) | ORAL | 0 refills | Status: AC

## 2017-12-12 MED ORDER — LIDOCAINE HCL (PF) 1 % IJ SOLN
5.0000 mL | Freq: Once | INTRAMUSCULAR | Status: DC
  Filled 2017-12-12: qty 6

## 2017-12-12 NOTE — ED Notes (Addendum)
At work and noticed foot and ankle painful Tuesday  Taking tylenol and ibuprofen without relief  R foot reddened and swollen  Pt denies hx of gout

## 2017-12-12 NOTE — ED Triage Notes (Addendum)
Pt c/o left ankle and foot swelling that started Tuesday. Denies injury. Pt having difficult time with ambulation. Pedal pulses present. Skin warm, dry, intact.

## 2017-12-12 NOTE — Discharge Instructions (Signed)
Your x-ray tonight shows that you have fluid in the joint of the ankle. We are starting you on antibiotics in case this is infection. We are also giving you medication for pain and inflammation. You will need to follow up with your doctor or return here in 2 days for recheck to be sure the ankle is improving. Return sooner for any problems.

## 2017-12-12 NOTE — ED Provider Notes (Signed)
[facility] EMERGENCY DEPARTMENT Provider Note   CSN: [id number] Arrival date & time: 12/12/17  1744     History   Chief Complaint Chief Complaint  Patient presents with  . Foot Swelling    HPI Douglas Dorsey is a 42 y.o. male who presents to the ED with right ankle pain that started 3 days ago and has gotten worse. Patient reports he has injured this ankle in the past and has has problems with it off and on. This time he noted the pain and swelling while he was walking at work. Patient reports hx of plantar fasciitis and ankle sprain. [name] not taking pain medication. Patient denies ETOH, or IV drug use.   The history is provided by the patient. No language interpreter was used.  Ankle Pain   The incident occurred more than 2 days ago. The incident occurred at work. There was no injury mechanism. The pain is present in the right ankle. The quality of the pain is described as aching. The pain is moderate. The pain has been constant since onset. He reports no foreign bodies present. The symptoms are aggravated by bearing weight. He has tried nothing for the symptoms.    Past Medical History:  Diagnosis Date  . Complication of anesthesia    pt sts "they told me it took longer for them to wake me up than it should have".  . Family history of coronary artery disease 12/24/2011  . GERD (gastroesophageal reflux disease)   . Hypertension   . Tobacco abuse     Patient Active Problem List   Diagnosis Date Noted  . AKI (acute kidney injury) ([place]) 12/25/2016  . Dehydration 12/25/2016  . Cramp in muscle 12/25/2016  . Hx of colonic polyps   . Barrett's esophagus 02/14/2015  . History of adenomatous polyp of colon 02/14/2015  . Plantar fasciitis 06/23/2014  . Bloody stool 12/28/2013  . GERD (gastroesophageal reflux disease) 12/28/2013  . Ankle sprain 02/08/2012  . Cellulitis of foot 12/24/2011  . Tinea pedis 12/24/2011  . Tobacco abuse 12/24/2011  . Family history of coronary artery  disease 12/24/2011    Past Surgical History:  Procedure Laterality Date  . COLONOSCOPY N/A 01/20/2014   RMR: Colonic polyps -removed as descrbied above. Friable anal canal hemorrhoids  . COLONOSCOPY N/A 02/22/2015   Procedure: COLONOSCOPY;  Surgeon: Daneil Dolin, MD;  [facility];  Service: Endoscopy;  Laterality: N/A;  1400  . ESOPHAGOGASTRODUODENOSCOPY N/A 01/20/2014   [id number] EG junction abnoraml distal esophagus- query  short segment Barretts -status post esophageal biospy. Hiatal hernia. Abnormal gastric mucosa-status post gastric biopsy.   . ESOPHAGOGASTRODUODENOSCOPY N/A 02/22/2015   Procedure: ESOPHAGOGASTRODUODENOSCOPY (EGD);  Surgeon: Daneil Dolin, MD;  [facility];  Service: Endoscopy;  Laterality: N/A;  . HERNIA REPAIR     Umbilical  . INCISIONAL HERNIA REPAIR N/A 12/17/2013   Procedure: LAPAROSCOPIC INCISIONAL HERNIA REPAIR WITH MESH;  Surgeon: Jamesetta So, MD;  Location: [facility];  Service: General;  Laterality: N/A;  . INSERTION OF MESH N/A 12/17/2013   Procedure: INSERTION OF MESH;  Surgeon: Jamesetta So, MD;  Location: [facility];  Service: General;  Laterality: N/A;  . MULTIPLE EXTRACTIONS WITH ALVEOLOPLASTY Bilateral 12/20/2016   Procedure: MULTIPLE EXTRACTION OF TEETH NUMBER ONE, THREE, SIX, SEVEN, EIGHT, NINE, TEN, ELEVEN, FOURTEEN, NINETEEN, TWENTY, TWENTY-ONE, TWENTY-TWO, TWENTY-THREE, TWENTY-FOUR, TWENTY- FIVE, TWENTY-SIX, TWENTY-SEVEN, TWENTY-EIGHT, TWENTY-NINE, THIRTY  ;   ALVEOLOPLASTY; BIOPSY LESION OF UVULA;  Surgeon: Diona Browner,  DDS;  Location: [place];  Service: Oral Surgery;  Laterality: Bilateral;  . MULTIPLE TOOTH EXTRACTIONS    . teeth extracted     12 teeth removed in [facility] Medications    Prior to Admission medications   Medication Sig Start Date End Date Taking? Authorizing Provider  cephALEXin (KEFLEX) 500 MG capsule Take 1 capsule (500 mg total) by mouth 4 (four) times daily. 12/12/17   Ashley Murrain, NP    naproxen (NAPROSYN) 500 MG tablet Take 1 tablet (500 mg total) by mouth 2 (two) times daily. 12/12/17   Ashley Murrain, NP  promethazine-dextromethorphan (PROMETHAZINE-DM) 6.25-15 MG/5ML syrup Take 5 mLs by mouth 4 (four) times daily as needed for cough. 08/18/17   Emeline General, PA-C  ranitidine (ZANTAC) 150 MG tablet Take 1 tablet (150 mg total) by mouth 2 (two) times daily. 12/26/16 12/26/17  Arrien, Jimmy Picket, MD  sulfamethoxazole-trimethoprim (BACTRIM DS,SEPTRA DS) 800-160 MG tablet Take 1 tablet by mouth 2 (two) times daily for 7 days. 12/12/17 12/19/17  Ashley Murrain, NP  traMADol (ULTRAM) 50 MG tablet Take 1 tablet (50 mg total) by mouth every 8 (eight) hours as needed for severe pain. 12/26/16   Arrien, Jimmy Picket, MD    Family History Family History  Problem Relation Age of Onset  . Diabetes Mother   . Hypertension Mother   . Heart failure Father   . Diabetes Brother   . Hypertension Brother   . Lung disease Unknown   . Colon cancer Neg Hx   . Inflammatory bowel disease Neg Hx     Social History Social History   Tobacco Use  . Smoking status: Current Every Day Smoker    Packs/day: 2.00    Years: 25.00    Pack years: 50.00    Types: Cigarettes  . Smokeless tobacco: Never Used  Substance Use Topics  . Alcohol use: No  . Drug use: No     Allergies   No known allergies   Review of Systems Review of Systems  Musculoskeletal: Positive for arthralgias.  All other systems reviewed and are negative.    Physical Exam Updated Vital Signs BP (!) 132/92   Pulse 90   Temp 97.9 F (36.6 C)   Resp 16   Ht 5\' 9"  (1.753 m)   Wt 105.2 kg (232 lb)   SpO2 99%   BMI 34.26 kg/m   Physical Exam  Constitutional: He appears well-developed and well-nourished. No distress.  HENT:  Head: Normocephalic and atraumatic.  Eyes: EOM are normal.  Neck: Neck supple.  Cardiovascular: Normal rate and intact distal pulses.  Pulmonary/Chest: Effort normal.   Musculoskeletal:       Right ankle: He exhibits swelling. He exhibits no deformity, no laceration and normal pulse. Decreased range of motion: due to pain. Tenderness. Lateral malleolus tenderness found. Achilles tendon normal.  There is increased warmth to the lateral aspect of the right ankle. There is no red streaking. Pedal pulses 2+, adequate circulation. No calf pain.   Neurological: [name] alert.  Skin: Skin is warm and dry.  Psychiatric: He has a normal mood and affect.  Nursing note and vitals reviewed.    ED Treatments / Results  Labs (all labs ordered are listed, but only abnormal results are displayed) Labs Reviewed - No data to display  Radiology Dg Ankle Complete Right  Addendum Date: 12/12/2017   ADDENDUM REPORT: 12/12/2017 21:26 ADDENDUM: These results were called  by telephone at the time of interpretation on 12/12/2017 at 9:20 pm to Dr. Debroah Baller , who verbally acknowledged these results. Electronically Signed   By: Fidela Salisbury M.D.   On: 12/12/2017 21:26   Result Date: 12/12/2017 CLINICAL DATA:  Right lateral malleolus pain.  Swelling and redness. EXAM: RIGHT ANKLE - COMPLETE 3+ VIEW COMPARISON:  None. FINDINGS: There is no evidence of fracture, or dislocation. Probable joint effusion. There is no evidence of arthropathy or other focal bone abnormality. Soft tissue swelling about the lateral malleolus. IMPRESSION: Probable right ankle joint effusion. Internal derangement or septic joint cannot be excluded. No acute fracture or dislocation identified about the right ankle. Electronically Signed: By: Fidela Salisbury M.D. On: 12/12/2017 21:16    Procedures Procedures (including critical care time)  Medications Ordered in ED Medications  lidocaine (PF) (XYLOCAINE) 1 % injection 5 mL (has no administration in time range)  HYDROcodone-acetaminophen (NORCO/VICODIN) 5-325 MG per tablet 1 tablet (1 tablet Oral Given 12/12/17 2105)     Initial Impression /  Assessment and Plan / ED Course  I have reviewed the triage vital signs and the nursing notes. 42 y.o. male with pain and swelling to the lateral aspect of the right ankle stable for d/c without fever and does not appear toxic. X-ray without fracture or dislocation but radiologist called to say she could not rule out infection. Dr. Jeanell Sparrow in to see the patient. Will treat with antibiotics and NSAIDs and close follow up. Ace wrap, ice, crutches. No focal neuro deficits prior to d/c.   Final Clinical Impressions(s) / ED Diagnoses   Final diagnoses:  Right ankle effusion    ED Discharge Orders        Ordered    sulfamethoxazole-trimethoprim (BACTRIM DS,SEPTRA DS) 800-160 MG tablet  2 times daily     12/12/17 2254    cephALEXin (KEFLEX) 500 MG capsule  4 times daily     12/12/17 2254    naproxen (NAPROSYN) 500 MG tablet  2 times daily     12/12/17 2254       Debroah Baller [place], NP 12/12/17 2305    Pattricia Boss, MD 12/14/17 0000

## 2017-12-15 DIAGNOSIS — Z139 Encounter for screening, unspecified: Secondary | ICD-10-CM

## 2017-12-15 LAB — GLUCOSE, POCT (MANUAL RESULT ENTRY): POC GLUCOSE: 106 mg/dL — AB (ref 70–99)

## 2017-12-15 NOTE — Patient Instructions (Signed)
[place]

## 2017-12-15 NOTE — Congregational Nurse Program (Signed)
Congregational Nurse Program Note  Date of Encounter: 12/15/2017  Past Medical History: Past Medical History:  Diagnosis Date  . Complication of anesthesia    pt sts "they told me it took longer for them to wake me up than it should have".  . Family history of coronary artery disease 12/24/2011  . GERD (gastroesophageal reflux disease)   . Hypertension   . Tobacco abuse     Encounter Details: CNP Questionnaire - 12/15/17 1136      Questionnaire   Patient Status  Not Applicable    Race  White or Caucasian    Location Patient Served At  [facility]  Not Applicable    Uninsured  Uninsured (NEW 1x/quarter)    Food  No food insecurities    Housing/Utilities  Yes, have permanent housing    Transportation  No transportation needs    Interpersonal Safety  Yes, feel physically and emotionally safe where you currently live    Medication  Yes, have medication insecurities;Provided medication assistance    Medical Provider  No    Referrals  [facility];Primary Care Provider/Clinic    ED Visit Averted  Not Applicable    Life-Saving Intervention Made  Not Applicable      New Client into [facility]. Client was seen in the Emergency room at [facility] on 12/12/17 Complaint of right foot and ankle swelling and pain. Client reports it started a couple days prior to ER visit while at work. Denies any injury to ankle . Client states that he was told in the ER that it could be inflammation and infection and prescribed two antibiotics and an anti-inflammatory medication. He was unable to have medications filled because of the cost. He has only taken tylenol for his right foot. He states he has used the ace wrap given at emergency room and that helped some, but does not have it on today. He has used ice and rest as directed. [name] using crutches today.  Douglas Dorsey works full time but has been out of work for 3 days due to his foot. [name] off  today and is scheduled to return Wednesday. He has no insurance only medicaid family planning. He lives with his wife who is not working and 3 children under 18. He states DSS told him he did not qualify for medicaid. He receives rent assistance from the housing authority. Today client is here to ask for help navigating into a primary care provider, but also possible help with a one time medication assistance.  Client formerly was with [facility] however has not been there since last year and when he lost his transitional medicaid in Jan 2019.  Past medical History GERD Borderline hypertension per [facility] Plantar fascitis Cellulitis left foot 2013 Dental surgery Rectal bleeding abscess right axilla 2016 Acute kidney 2018 after dental surgery   Surgical history Hernia repairs [id number].  Dental surgeries 2012,2018 Colonoscopy   Alert and oriented to person, place and time. Answers questions appropriately. Client using crutches to assist and pressure on right foot. He states that he was unable to obtain his medications due to cost and is has not been to the health department for a year. He states he has tried to rest his foot over the weekend, but still complains of pain and swelling. Right foot and ankle positive for swelling, little redness, no redness noticed in lower leg. Client is afebrile today temp 98.7 orally  and denies fever over weekend. He states he has rested it and did have Ace Wrap to it until yesterday and he has taken it off for a 'break". He does not have ace on today. Client reports that he has elevated it and rested as much as possible. Client's right foot from ankle to mid foot appears swollen. Some redness noted but no streaks up leg. Client states the swelling and pain are some better since Friday. Positive pedal pulses. RN instructed client again on compression ace and RICE method that ER MD had recommended. Client states understanding. Client denies chest pains or any shortness of  breath. Only other complaint client has is that his hands cramp some and will have some pain in them. Discussed with client the effects of smoking on his circulation and blood pressure. Client states understanding..  Client denies suicidal or Homicidal thoughts. Discussed with client options we have for referral into a primary care provider. [facility] [place] due to proximity of clinic to his home. Referral made and appointment secured for 12/18/17 at [place] am. RN also discussed that [facility] could offer a one time assistance with his antibiotics and anti-inflammatory today that was prescribed in ER. Client agreeable. Prescriptions and voucher faxed to [facility]. Client instructed to begin those medications today as order by ER MD. Client states understanding. Client instructed to seek emergency treatment if swelling or redness spreads up leg or becomes worse, or he begins running a fever. Client states verbal understanding Also given to client in writing. Discussed with client to follow recommendations of ER MD for REST ICE Compression and Elevation (RICE) client states understanding. RN encouraged client to ask for pharmacist recommendations regarding taking medications with or without food and possible side effects. Client states understanding.  Client has [employer] and [employer] contact information along with list of things to take to his first appointment as well as written instructions. RN will follow up with client [facility].  [place] [phone]

## 2017-12-16 ENCOUNTER — Telehealth: Payer: Self-pay

## 2017-12-16 NOTE — Telephone Encounter (Signed)
Called to follow up after visit to [facility] on 12/15/17. No answer voicemail left for client to return call.  [place] [phone]

## 2017-12-17 ENCOUNTER — Telehealth: Payer: Self-pay

## 2017-12-17 NOTE — Telephone Encounter (Signed)
Client returned previous call to follow up regarding his antibiotics. Client states he was able to pick those up and has been taking them as directed by MD. Client reports his foot is "not as swollen and not as painful". Reminded client of precautions discussed at his visit to [facility] regarding elevation of right foot. Staying off foot as much as possible. Client denies fever or chills. Client [facility]. Client states he will be making his appointment. He also states he went to work to talk to them about his foot and upcoming appointment. He states all is okay at present.  Will follow up with client after his appointment. Client is agreeable to follow up by phone.  [place] [phone]

## 2017-12-18 ENCOUNTER — Other Ambulatory Visit (HOSPITAL_COMMUNITY)
Admission: RE | Admit: 2017-12-18 | Discharge: 2017-12-18 | Disposition: A | Discharge status: Home / Self Care | Payer: Medicaid Other | Source: Ambulatory Visit | Attending: Physician Assistant | Admitting: Physician Assistant

## 2017-12-18 ENCOUNTER — Ambulatory Visit: Payer: Medicaid Other | Admitting: Physician Assistant

## 2017-12-18 ENCOUNTER — Ambulatory Visit (HOSPITAL_COMMUNITY)
Admission: RE | Admit: 2017-12-18 | Discharge: 2017-12-18 | Disposition: A | Discharge status: Home / Self Care | Payer: Self-pay | Source: Ambulatory Visit | Attending: Physician Assistant | Admitting: Physician Assistant

## 2017-12-18 ENCOUNTER — Encounter: Payer: Self-pay | Admitting: Physician Assistant

## 2017-12-18 VITALS — BP 151/88 | HR 84 | Temp 98.2°F | Ht 66.75 in | Wt 224.5 lb

## 2017-12-18 DIAGNOSIS — Z720 Tobacco use: Secondary | ICD-10-CM

## 2017-12-18 DIAGNOSIS — R9389 Abnormal findings on diagnostic imaging of other specified body structures: Secondary | ICD-10-CM

## 2017-12-18 DIAGNOSIS — M25571 Pain in right ankle and joints of right foot: Secondary | ICD-10-CM

## 2017-12-18 DIAGNOSIS — Z8719 Personal history of other diseases of the digestive system: Secondary | ICD-10-CM

## 2017-12-18 DIAGNOSIS — Z7689 Persons encountering health services in other specified circumstances: Secondary | ICD-10-CM

## 2017-12-18 DIAGNOSIS — Z8249 Family history of ischemic heart disease and other diseases of the circulatory system: Secondary | ICD-10-CM | POA: Insufficient documentation

## 2017-12-18 DIAGNOSIS — I10 Essential (primary) hypertension: Secondary | ICD-10-CM

## 2017-12-18 DIAGNOSIS — Z13220 Encounter for screening for lipoid disorders: Secondary | ICD-10-CM

## 2017-12-18 DIAGNOSIS — M25471 Effusion, right ankle: Secondary | ICD-10-CM

## 2017-12-18 DIAGNOSIS — Z87898 Personal history of other specified conditions: Secondary | ICD-10-CM

## 2017-12-18 DIAGNOSIS — K219 Gastro-esophageal reflux disease without esophagitis: Secondary | ICD-10-CM

## 2017-12-18 LAB — CBC WITH DIFFERENTIAL/PLATELET
BASOS ABS: 0 10*3/uL (ref 0.0–0.1)
BASOS PCT: 0 %
Eosinophils Absolute: 0.2 10*3/uL (ref 0.0–0.7)
Eosinophils Relative: 2 %
HEMATOCRIT: 47.1 % (ref 39.0–52.0)
Hemoglobin: 16 g/dL (ref 13.0–17.0)
Lymphocytes Relative: 21 %
Lymphs Abs: 2.2 10*3/uL (ref 0.7–4.0)
MCH: 29 pg (ref 26.0–34.0)
MCHC: 34 g/dL (ref 30.0–36.0)
MCV: 85.3 fL (ref 78.0–100.0)
MONO ABS: 0.4 10*3/uL (ref 0.1–1.0)
Monocytes Relative: 4 %
NEUTROS ABS: 7.3 10*3/uL (ref 1.7–7.7)
Neutrophils Relative %: 73 %
Platelets: 251 10*3/uL (ref 150–400)
RBC: 5.52 MIL/uL (ref 4.22–5.81)
RDW: 14.1 % (ref 11.5–15.5)
WBC: 10.1 10*3/uL (ref 4.0–10.5)

## 2017-12-18 LAB — LIPID PANEL
CHOL/HDL RATIO: 4.4 ratio
Cholesterol: 192 mg/dL (ref 0–200)
HDL: 44 mg/dL (ref >40)
LDL Cholesterol: 119 mg/dL — ABNORMAL HIGH (ref 0–99)
Triglycerides: 147 mg/dL (ref <150)
VLDL: 29 mg/dL (ref 0–40)

## 2017-12-18 LAB — URIC ACID: URIC ACID, SERUM: 5.2 mg/dL (ref 4.4–7.6)

## 2017-12-18 LAB — COMPREHENSIVE METABOLIC PANEL
ALT: 31 U/L (ref 17–63)
AST: 23 U/L (ref 15–41)
Albumin: 4 g/dL (ref 3.5–5.0)
Alkaline Phosphatase: 88 U/L (ref 38–126)
Anion gap: 8 (ref 5–15)
BILIRUBIN TOTAL: 0.4 mg/dL (ref 0.3–1.2)
BUN: 20 mg/dL (ref 6–20)
CO2: 26 mmol/L (ref 22–32)
Calcium: 9.5 mg/dL (ref 8.9–10.3)
Chloride: 100 mmol/L — ABNORMAL LOW (ref 101–111)
Creatinine, Ser: 0.87 mg/dL (ref 0.61–1.24)
GFR calc Af Amer: >60 mL/min (ref >60)
GFR calc non Af Amer: >60 mL/min (ref >60)
Glucose, Bld: 104 mg/dL — ABNORMAL HIGH (ref 65–99)
POTASSIUM: 4.7 mmol/L (ref 3.5–5.1)
Sodium: 134 mmol/L — ABNORMAL LOW (ref 135–145)
Total Protein: 7.8 g/dL (ref 6.5–8.1)

## 2017-12-18 MED ORDER — LISINOPRIL 10 MG PO TABS: 10.0000 mg | ORAL_TABLET | Freq: Every day | ORAL | 1 refills | Status: DC

## 2017-12-18 NOTE — Patient Instructions (Signed)

## 2017-12-18 NOTE — Progress Notes (Signed)
BP (!) 151/88 (BP Location: Right Arm, Patient Position: Sitting, Cuff Size: Normal)   Pulse 84   Temp 98.2 F (36.8 C)   Ht 5' 6.75" (1.695 m)   Wt 224 lb 8 oz (101.8 kg)   SpO2 97%   BMI 35.43 kg/m    Subjective:    Patient ID: Douglas Dorsey, male    DOB: 13-Jan-1976, 42 y.o.   MRN: [id number]  HPI: Douglas Dorsey is a 42 y.o. male presenting on 12/18/2017 for New Patient (Initial Visit) (pt previously seen at [facility]. pt last seen last year)   HPI   Chief Complaint  Patient presents with  . New Patient (Initial Visit)    pt previously seen at [facility]. pt last seen last year    Hx Barrent's esophagus- last EGD 2016- due for recall august 2019.   Pt says he has never been on medication for his bp.   Pt was seen in ER recently for ankle pain and swelling and he was placed on 2 antibiotics.  He says his ankle is improving but it is still swelled some.   Relevant past medical, surgical, family and social history reviewed and updated as indicated. Interim medical history since our last visit reviewed. Allergies and medications reviewed and updated.   Current Outpatient Medications:  .  cephALEXin (KEFLEX) 500 MG capsule, Take 1 capsule (500 mg total) by mouth 4 (four) times daily., Disp: 28 capsule, Rfl: 0 .  naproxen (NAPROSYN) 500 MG tablet, Take 1 tablet (500 mg total) by mouth 2 (two) times daily., Disp: 30 tablet, Rfl: 0 .  omeprazole (PRILOSEC OTC) 20 MG tablet, Take 20 mg by mouth daily., Disp: , Rfl:  .  sulfamethoxazole-trimethoprim (BACTRIM DS,SEPTRA DS) 800-160 MG tablet, Take 1 tablet by mouth 2 (two) times daily for 7 days., Disp: 14 tablet, Rfl: 0  Review of Systems  Constitutional: Negative for appetite change, chills, diaphoresis, fatigue, fever and unexpected weight change.  HENT: Positive for ear pain. Negative for congestion, dental problem, drooling, facial swelling, hearing loss, mouth sores, sneezing, sore throat, trouble swallowing and voice change.   Eyes:  Negative for pain, discharge, redness, itching and visual disturbance.  Respiratory: Negative for cough, choking, shortness of breath and wheezing.   Cardiovascular: Negative for chest pain, palpitations and leg swelling.  Gastrointestinal: Positive for blood in stool. Negative for abdominal pain, constipation, diarrhea and vomiting.  Endocrine: Negative for cold intolerance, heat intolerance and polydipsia.  Genitourinary: Negative for decreased urine volume, dysuria and hematuria.  Musculoskeletal: Positive for arthralgias, back pain and gait problem.  Skin: Negative for rash.  Allergic/Immunologic: Negative for environmental allergies.  Neurological: Positive for headaches. Negative for seizures, syncope and light-headedness.  Hematological: Negative for adenopathy.  Psychiatric/Behavioral: Negative for agitation, dysphoric mood and suicidal ideas. The patient is not nervous/anxious.     Per HPI unless specifically indicated above     Objective:    BP (!) 151/88 (BP Location: Right Arm, Patient Position: Sitting, Cuff Size: Normal)   Pulse 84   Temp 98.2 F (36.8 C)   Ht 5' 6.75" (1.695 m)   Wt 224 lb 8 oz (101.8 kg)   SpO2 97%   BMI 35.43 kg/m   Wt Readings from Last 3 Encounters:  12/18/17 224 lb 8 oz (101.8 kg)  12/15/17 224 lb (101.6 kg)  12/12/17 232 lb (105.2 kg)    Physical Exam  Constitutional: [name] oriented to person, place, and time. He appears well-developed and well-nourished.  HENT:  Head: Normocephalic and atraumatic.  Mouth/Throat: Oropharynx is clear and moist. No oropharyngeal exudate.  Eyes: Pupils are equal, round, and reactive to light. Conjunctivae and EOM are normal.  Neck: Neck supple. No thyromegaly present.  Cardiovascular: Normal rate and regular rhythm.  Pulmonary/Chest: Effort normal and breath sounds normal. He has no wheezes. He has no rales.  Abdominal: Soft. Bowel sounds are normal. He exhibits no mass. There is no hepatosplenomegaly.  There is no tenderness.  Musculoskeletal: He exhibits no edema.       Right ankle: He exhibits swelling. He exhibits normal range of motion, no ecchymosis, no deformity, no laceration and normal pulse. Tenderness (mild lateral.  no point tenderness).  No erythema or heat  Lymphadenopathy:    He has no cervical adenopathy.  Neurological: [name] alert and oriented to person, place, and time.  Skin: Skin is warm and dry. No rash noted.  Psychiatric: He has a normal mood and affect. His behavior is normal. Thought content normal.  Nursing note and vitals reviewed.   Results for orders placed or performed in visit on 12/15/17  POCT glucose  Result Value Ref Range   POC Glucose 106 (A) 70 - 99 mg/dl      Assessment & Plan:   Encounter Diagnoses  Name Primary?  . Encounter to establish care Yes  . Essential hypertension   . History of prediabetes   . Gastroesophageal reflux disease, esophagitis presence not specified   . Tobacco abuse   . Acute right ankle pain   . Right ankle swelling   . Abnormal x-ray   . Screening cholesterol level   . Family history of premature coronary artery disease   . History of Barrett's esophagus     -Check labs today -counseled smoking cessation -pt to [facility] antibiotics -Repeat xray ankle today -Start lisinopril for blood pressure -pt was given application for [facility] -pt to Follow up 2-3 wk.  RTO sooner prn worseinng or new symptoms

## 2017-12-19 LAB — HEMOGLOBIN A1C
Hgb A1c MFr Bld: 5.8 % — ABNORMAL HIGH (ref 4.8–5.6)
MEAN PLASMA GLUCOSE: 120 mg/dL

## 2017-12-22 ENCOUNTER — Telehealth: Payer: Self-pay

## 2017-12-22 NOTE — Telephone Encounter (Signed)
Pt was seen at the [facility] on 12/15/17 due to complaint of right foot and ankle swelling and pain. Pt was referred to the [facility] and appointment was secured for 12/18/17 at 0915 am.    Follow-up call was made today 12/22/17. No answer. Left voicemail.    Iracema Lanagan R. [place], LPN [phone]

## 2018-01-01 ENCOUNTER — Ambulatory Visit: Payer: Medicaid Other | Admitting: Physician Assistant

## 2018-01-07 ENCOUNTER — Ambulatory Visit: Payer: Medicaid Other | Admitting: Physician Assistant

## 2018-01-07 ENCOUNTER — Encounter: Payer: Self-pay | Admitting: Physician Assistant

## 2018-01-07 VITALS — BP 123/82 | HR 80 | Temp 97.9°F | Ht 66.75 in | Wt 230.0 lb

## 2018-01-07 DIAGNOSIS — E785 Hyperlipidemia, unspecified: Secondary | ICD-10-CM

## 2018-01-07 DIAGNOSIS — Z8719 Personal history of other diseases of the digestive system: Secondary | ICD-10-CM

## 2018-01-07 DIAGNOSIS — I10 Essential (primary) hypertension: Secondary | ICD-10-CM

## 2018-01-07 DIAGNOSIS — Z720 Tobacco use: Secondary | ICD-10-CM

## 2018-01-07 MED ORDER — LISINOPRIL 10 MG PO TABS: 10.0000 mg | ORAL_TABLET | Freq: Every day | ORAL | 3 refills | Status: DC

## 2018-01-07 NOTE — Progress Notes (Signed)
BP 123/82 (BP Location: Right Arm, Patient Position: Sitting, Cuff Size: Normal)   Pulse 80   Temp 97.9 F (36.6 C) (Other (Comment))   Ht 5' 6.75" (1.695 m)   Wt 230 lb (104.3 kg)   SpO2 95%   BMI 36.29 kg/m    Subjective:    Patient ID: Douglas Dorsey, male    DOB: [birth year], 42 y.o.   MRN: [id number]  HPI: Douglas Dorsey is a 42 y.o. male presenting on 01/07/2018 for Follow-up (states right ankle is much better)   HPI  Pt hasn't turned in charity care application yet  Pt is feeling well today  Relevant past medical, surgical, family and social history reviewed and updated as indicated. Interim medical history since our last visit reviewed. Allergies and medications reviewed and updated.   Current Outpatient Medications:  .  lisinopril (PRINIVIL,ZESTRIL) 10 MG tablet, Take 1 tablet (10 mg total) by mouth daily., Disp: 30 tablet, Rfl: 1 .  omeprazole (PRILOSEC OTC) 20 MG tablet, Take 20 mg by mouth daily., Disp: , Rfl:    Review of Systems  Constitutional: Negative for appetite change, chills, diaphoresis, fatigue, fever and unexpected weight change.  HENT: Positive for ear pain and hearing loss. Negative for congestion, dental problem, drooling, facial swelling, mouth sores, sneezing, sore throat, trouble swallowing and voice change.   Eyes: Negative for pain, discharge, redness, itching and visual disturbance.  Respiratory: Positive for cough. Negative for choking, shortness of breath and wheezing.   Cardiovascular: Negative for chest pain, palpitations and leg swelling.  Gastrointestinal: Negative for abdominal pain, blood in stool, constipation, diarrhea and vomiting.  Endocrine: Negative for cold intolerance, heat intolerance and polydipsia.  Genitourinary: Negative for decreased urine volume, dysuria and hematuria.  Musculoskeletal: Positive for back pain. Negative for arthralgias and gait problem.  Skin: Negative for rash.  Allergic/Immunologic: Negative for  environmental allergies.  Neurological: Negative for seizures, syncope, light-headedness and headaches.  Hematological: Negative for adenopathy.  Psychiatric/Behavioral: Negative for agitation, dysphoric mood and suicidal ideas. The patient is not nervous/anxious.     Per HPI unless specifically indicated above     Objective:    BP 123/82 (BP Location: Right Arm, Patient Position: Sitting, Cuff Size: Normal)   Pulse 80   Temp 97.9 F (36.6 C) (Other (Comment))   Ht 5' 6.75" (1.695 m)   Wt 230 lb (104.3 kg)   SpO2 95%   BMI 36.29 kg/m   Wt Readings from Last 3 Encounters:  01/07/18 230 lb (104.3 kg)  12/18/17 224 lb 8 oz (101.8 kg)  12/15/17 224 lb (101.6 kg)    Physical Exam  Constitutional: [name] oriented to person, place, and time. He appears well-developed and well-nourished.  HENT:  Head: Normocephalic and atraumatic.  Neck: Neck supple.  Cardiovascular: Normal rate and regular rhythm.  Pulmonary/Chest: Effort normal and breath sounds normal. He has no wheezes.  Abdominal: Soft. Bowel sounds are normal. There is no hepatosplenomegaly. There is no tenderness.  Musculoskeletal: He exhibits no edema.       Right ankle: Normal.  Lymphadenopathy:    He has no cervical adenopathy.  Neurological: [name] alert and oriented to person, place, and time.  Skin: Skin is warm and dry.  Psychiatric: He has a normal mood and affect. His behavior is normal.  Vitals reviewed.   Results for orders placed or performed during the hospital encounter of 12/18/17  Uric acid  Result Value Ref Range   Uric Acid, Serum 5.2  4.4 - 7.6 mg/dL  Hemoglobin A1c  Result Value Ref Range   Hgb A1c MFr Bld 5.8 (H) 4.8 - 5.6 %   Mean Plasma Glucose 120 mg/dL  CBC with Differential  Result Value Ref Range   WBC 10.1 4.0 - 10.5 K/uL   RBC 5.52 4.22 - 5.81 MIL/uL   Hemoglobin 16.0 13.0 - 17.0 g/dL   HCT 47.1 39.0 - 52.0 %   MCV 85.3 78.0 - 100.0 fL   MCH 29.0 26.0 - 34.0 pg   MCHC 34.0 30.0 -  36.0 g/dL   RDW 14.1 11.5 - 15.5 %   Platelets 251 150 - 400 K/uL   Neutrophils Relative % 73 %   Neutro Abs 7.3 1.7 - 7.7 K/uL   Lymphocytes Relative 21 %   Lymphs Abs 2.2 0.7 - 4.0 K/uL   Monocytes Relative 4 %   Monocytes Absolute 0.4 0.1 - 1.0 K/uL   Eosinophils Relative 2 %   Eosinophils Absolute 0.2 0.0 - 0.7 K/uL   Basophils Relative 0 %   Basophils Absolute 0.0 0.0 - 0.1 K/uL  Comprehensive metabolic panel  Result Value Ref Range   Sodium 134 (L) 135 - 145 mmol/L   Potassium 4.7 3.5 - 5.1 mmol/L   Chloride 100 (L) 101 - 111 mmol/L   CO2 26 22 - 32 mmol/L   Glucose, Bld 104 (H) 65 - 99 mg/dL   BUN 20 6 - 20 mg/dL   Creatinine, Ser 0.87 0.61 - 1.24 mg/dL   Calcium 9.5 8.9 - 10.3 mg/dL   Total Protein 7.8 6.5 - 8.1 g/dL   Albumin 4.0 3.5 - 5.0 g/dL   AST 23 15 - 41 U/L   ALT 31 17 - 63 U/L   Alkaline Phosphatase 88 38 - 126 U/L   Total Bilirubin 0.4 0.3 - 1.2 mg/dL   GFR calc non Af Amer >60 >60 mL/min   GFR calc Af Amer >60 >60 mL/min   Anion gap 8 5 - 15  Lipid panel  Result Value Ref Range   Cholesterol 192 0 - 200 mg/dL   Triglycerides 147 <150 mg/dL   HDL 44 >40 mg/dL   Total CHOL/HDL Ratio 4.4 RATIO   VLDL 29 0 - 40 mg/dL   LDL Cholesterol 119 (H) 0 - 99 mg/dL      Assessment & Plan:   Encounter Diagnoses  Name Primary?  . Essential hypertension Yes  . Hyperlipidemia, unspecified hyperlipidemia type   . Tobacco abuse   . History of Barrett's esophagus     -reviewed labs with pt -encouraged pt to Turn in charity care application -Continue lisinopril -counseled on lowfat diet and regular exercise to help lipids -pt to follow up 3 months.  RTO sooner prn

## 2018-01-07 NOTE — Patient Instructions (Signed)

## 2018-02-09 ENCOUNTER — Encounter: Payer: Self-pay | Admitting: Internal Medicine

## 2018-04-09 ENCOUNTER — Ambulatory Visit: Payer: Medicaid Other | Admitting: Physician Assistant

## 2018-06-10 ENCOUNTER — Encounter: Payer: Self-pay | Admitting: Physician Assistant

## 2018-06-10 ENCOUNTER — Ambulatory Visit: Payer: Medicaid Other | Admitting: Physician Assistant

## 2018-06-10 VITALS — BP 114/80 | HR 91 | Temp 97.9°F | Ht 66.75 in | Wt 222.0 lb

## 2018-06-10 DIAGNOSIS — E669 Obesity, unspecified: Secondary | ICD-10-CM

## 2018-06-10 DIAGNOSIS — E785 Hyperlipidemia, unspecified: Secondary | ICD-10-CM

## 2018-06-10 DIAGNOSIS — F17200 Nicotine dependence, unspecified, uncomplicated: Secondary | ICD-10-CM

## 2018-06-10 NOTE — Progress Notes (Signed)
BP 114/80 (BP Location: Left Arm, Patient Position: Sitting, Cuff Size: Normal)   Pulse 91   Temp 97.9 F (36.6 C)   Ht 5' 6.75" (1.695 m)   Wt 222 lb (100.7 kg)   SpO2 96%   BMI 35.03 kg/m    Subjective:    Patient ID: Douglas Dorsey, male    DOB: May 06, 1976, 42 y.o.   MRN: [id number]  HPI: Douglas Dorsey is a 42 y.o. male presenting on 06/10/2018 for Hypertension   HPI   Pt has been out of meds for about 3 months.  He cancelled his appt in september.   [name] still smoking.  Checks bp at home.  He says it runs 120s.  Highest was 140 one time but he says he was mad then  Pt is feeling well and has no complaint.  Relevant past medical, surgical, family and social history reviewed and updated as indicated. Interim medical history since our last visit reviewed. Allergies and medications reviewed and updated.  Current meds: none  Review of Systems  Constitutional: Negative for appetite change, chills, diaphoresis, fatigue, fever and unexpected weight change.  HENT: Negative for congestion, dental problem, drooling, ear pain, facial swelling, hearing loss, mouth sores, sneezing, sore throat, trouble swallowing and voice change.   Eyes: Negative for pain, discharge, redness, itching and visual disturbance.  Respiratory: Negative for cough, choking, shortness of breath and wheezing.   Cardiovascular: Negative for chest pain, palpitations and leg swelling.  Gastrointestinal: Positive for constipation. Negative for abdominal pain, blood in stool, diarrhea and vomiting.  Endocrine: Positive for polydipsia. Negative for cold intolerance and heat intolerance.  Genitourinary: Negative for decreased urine volume, dysuria and hematuria.  Musculoskeletal: Positive for arthralgias and back pain. Negative for gait problem.  Skin: Negative for rash.  Allergic/Immunologic: Negative for environmental allergies.  Neurological: Negative for seizures, syncope, light-headedness and headaches.   Hematological: Negative for adenopathy.  Psychiatric/Behavioral: Negative for agitation, dysphoric mood and suicidal ideas. The patient is not nervous/anxious.     Per HPI unless specifically indicated above     Objective:    BP 114/80 (BP Location: Left Arm, Patient Position: Sitting, Cuff Size: Normal)   Pulse 91   Temp 97.9 F (36.6 C)   Ht 5' 6.75" (1.695 m)   Wt 222 lb (100.7 kg)   SpO2 96%   BMI 35.03 kg/m   Wt Readings from Last 3 Encounters:  06/10/18 222 lb (100.7 kg)  01/07/18 230 lb (104.3 kg)  12/18/17 224 lb 8 oz (101.8 kg)    Physical Exam  Constitutional: [name] oriented to person, place, and time. He appears well-developed and well-nourished.  HENT:  Head: Normocephalic and atraumatic.  Neck: Neck supple.  Cardiovascular: Normal rate and regular rhythm.  Pulmonary/Chest: Effort normal and breath sounds normal. He has no wheezes.  Abdominal: Soft. Bowel sounds are normal. There is no hepatosplenomegaly. There is no tenderness.  Musculoskeletal: He exhibits no edema.  Lymphadenopathy:    He has no cervical adenopathy.  Neurological: [name] alert and oriented to person, place, and time.  Skin: Skin is warm and dry.  Psychiatric: He has a normal mood and affect. His behavior is normal.  Vitals reviewed.        Assessment & Plan:   Encounter Diagnoses  Name Primary?  . Hyperlipidemia, unspecified hyperlipidemia type Yes  . Obesity, unspecified classification, unspecified obesity type, unspecified whether serious comorbidity present   . Tobacco use disorder     -  pt to get fasting labs drawn tomorrow.  He will be called with results -discussed with pt and he will stay of HTN medication at this time.  He will continue to monitor it at home once or twice/week and he will RTO if bp goes up -pt counseled smoking cessation - pt to follow up 3 months   RTO sooner prn

## 2018-06-11 ENCOUNTER — Other Ambulatory Visit (HOSPITAL_COMMUNITY)
Admission: RE | Admit: 2018-06-11 | Discharge: 2018-06-11 | Disposition: A | Discharge status: Home / Self Care | Payer: Medicaid Other | Source: Ambulatory Visit | Attending: Physician Assistant | Admitting: Physician Assistant

## 2018-06-11 DIAGNOSIS — E785 Hyperlipidemia, unspecified: Secondary | ICD-10-CM | POA: Insufficient documentation

## 2018-06-11 LAB — LIPID PANEL
Cholesterol: 232 mg/dL — ABNORMAL HIGH (ref 0–200)
HDL: 43 mg/dL (ref >40)
LDL Cholesterol: 159 mg/dL — ABNORMAL HIGH (ref 0–99)
TRIGLYCERIDES: 150 mg/dL — AB (ref <150)
Total CHOL/HDL Ratio: 5.4 RATIO
VLDL: 30 mg/dL (ref 0–40)

## 2018-06-11 LAB — COMPREHENSIVE METABOLIC PANEL
ALK PHOS: 83 U/L (ref 38–126)
ALT: 28 U/L (ref 0–44)
AST: 23 U/L (ref 15–41)
Albumin: 4.2 g/dL (ref 3.5–5.0)
Anion gap: 7 (ref 5–15)
BILIRUBIN TOTAL: 0.5 mg/dL (ref 0.3–1.2)
BUN: 19 mg/dL (ref 6–20)
CO2: 29 mmol/L (ref 22–32)
Calcium: 9.8 mg/dL (ref 8.9–10.3)
Chloride: 99 mmol/L (ref 98–111)
Creatinine, Ser: 0.79 mg/dL (ref 0.61–1.24)
GFR calc non Af Amer: >60 mL/min (ref >60)
Glucose, Bld: 97 mg/dL (ref 70–99)
POTASSIUM: 4.7 mmol/L (ref 3.5–5.1)
SODIUM: 135 mmol/L (ref 135–145)
TOTAL PROTEIN: 8 g/dL (ref 6.5–8.1)

## 2018-06-16 ENCOUNTER — Other Ambulatory Visit: Payer: Self-pay | Admitting: Physician Assistant

## 2018-06-16 MED ORDER — SIMVASTATIN 20 MG PO TABS: 20.0000 mg | ORAL_TABLET | Freq: Every day | ORAL | 4 refills | Status: DC

## 2018-09-09 ENCOUNTER — Ambulatory Visit: Payer: Medicaid Other | Admitting: Physician Assistant

## 2019-04-29 ENCOUNTER — Ambulatory Visit: Payer: Medicaid Other | Admitting: Physician Assistant

## 2019-04-29 ENCOUNTER — Encounter: Payer: Self-pay | Admitting: Physician Assistant

## 2019-04-29 VITALS — BP 140/86 | HR 94 | Temp 98.2°F | Wt 240.0 lb

## 2019-04-29 DIAGNOSIS — Z9119 Patient's noncompliance with other medical treatment and regimen: Secondary | ICD-10-CM

## 2019-04-29 DIAGNOSIS — Z8719 Personal history of other diseases of the digestive system: Secondary | ICD-10-CM

## 2019-04-29 DIAGNOSIS — I10 Essential (primary) hypertension: Secondary | ICD-10-CM

## 2019-04-29 DIAGNOSIS — F17200 Nicotine dependence, unspecified, uncomplicated: Secondary | ICD-10-CM

## 2019-04-29 DIAGNOSIS — Z91199 Patient's noncompliance with other medical treatment and regimen due to unspecified reason: Secondary | ICD-10-CM

## 2019-04-29 DIAGNOSIS — R7303 Prediabetes: Secondary | ICD-10-CM

## 2019-04-29 DIAGNOSIS — E669 Obesity, unspecified: Secondary | ICD-10-CM

## 2019-04-29 DIAGNOSIS — E785 Hyperlipidemia, unspecified: Secondary | ICD-10-CM

## 2019-04-29 MED ORDER — SIMVASTATIN 20 MG PO TABS: 20.0000 mg | ORAL_TABLET | Freq: Every day | ORAL | 1 refills | Status: DC

## 2019-04-29 MED ORDER — LISINOPRIL 10 MG PO TABS: 10.0000 mg | ORAL_TABLET | Freq: Every day | ORAL | 1 refills | Status: DC

## 2019-04-29 NOTE — Progress Notes (Signed)
BP 140/86   Pulse 94   Temp 98.2 F (36.8 C)   Wt 240 lb (108.9 kg)   SpO2 97%   BMI 37.87 kg/m    Subjective:    Patient ID: Douglas Dorsey, male    DOB: [birth year], 43 y.o.   MRN: [id number]  HPI: Douglas Dorsey is a 43 y.o. male presenting on 04/29/2019 for New Patient (Initial Visit)   HPI    Pt had a negative covid 19 screening questionnaire.   Pt was last seen in our office in November 2019.  He was scheduled to follow up but cancelled his appointment and is just now returning for care.   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  He will be eligible for insurance at some point but he doesn't know if he is going to get it.    He says he is doing well and he has no complaints.    Relevant past medical, surgical, family and social history reviewed and updated as indicated. Interim medical history since our last visit reviewed. Allergies and medications reviewed and updated.   Current Outpatient Medications:  .  omeprazole (PRILOSEC) 20 MG capsule, Take 40 mg by mouth daily., Disp: , Rfl:  .  simvastatin (ZOCOR) 20 MG tablet, Take 1 tablet (20 mg total) by mouth at bedtime. (Patient not taking: Reported on 04/29/2019), Disp: 30 tablet, Rfl: 4    Review of Systems  Per HPI unless specifically indicated above     Objective:    BP 140/86   Pulse 94   Temp 98.2 F (36.8 C)   Wt 240 lb (108.9 kg)   SpO2 97%   BMI 37.87 kg/m   Wt Readings from Last 3 Encounters:  04/29/19 240 lb (108.9 kg)  06/10/18 222 lb (100.7 kg)  01/07/18 230 lb (104.3 kg)    Physical Exam Vitals signs reviewed.  Constitutional:      General: [name] not in acute distress.    Appearance: Normal appearance. [name] well-developed. [name] obese. [name] not ill-appearing.  HENT:     Head: Normocephalic and atraumatic.  Neck:     Musculoskeletal: Neck supple.  Cardiovascular:     Rate and Rhythm: Normal rate and regular rhythm.  Pulmonary:     Effort: Pulmonary effort is normal.     Breath sounds:  Normal breath sounds. No wheezing.  Abdominal:     General: Bowel sounds are normal.     Palpations: Abdomen is soft.     Tenderness: There is no abdominal tenderness.  Musculoskeletal:     Right lower leg: No edema.     Left lower leg: No edema.  Lymphadenopathy:     Cervical: No cervical adenopathy.  Skin:    General: Skin is warm and dry.  Neurological:     Mental Status: [name] alert and oriented to person, place, and time.  Psychiatric:        Attention and Perception: Attention normal.        Speech: Speech normal.        Behavior: Behavior normal. Behavior is cooperative.         Assessment & Plan:    Encounter Diagnoses  Name Primary?  . Hyperlipidemia, unspecified hyperlipidemia type Yes  . Essential hypertension   . Obesity, unspecified classification, unspecified obesity type, unspecified whether serious comorbidity present   . Tobacco use disorder   . Prediabetes   . Personal history of noncompliance with medical treatment, presenting  hazards to health   . History of Barrett's esophagus       -will update labs -restart Simvastatin and start lisinopril -pt counseled on diet and exercise -encouraged smoking cessation -Pt was scheduled for inflluenza vaccination -pt with history of Barrett's esophagus.  Last EGD 2016.  He will need to be referred to GI for re-evaluation -pt will follow up with telemedicine appointment in 1 month.  [name] to contact office sooner prn

## 2019-04-29 NOTE — Patient Instructions (Addendum)

## 2019-05-04 ENCOUNTER — Other Ambulatory Visit (HOSPITAL_COMMUNITY)
Admission: RE | Admit: 2019-05-04 | Discharge: 2019-05-04 | Disposition: A | Discharge status: Home / Self Care | Payer: Medicaid Other | Source: Ambulatory Visit | Attending: Physician Assistant | Admitting: Physician Assistant

## 2019-05-04 DIAGNOSIS — R7303 Prediabetes: Secondary | ICD-10-CM

## 2019-05-04 DIAGNOSIS — E785 Hyperlipidemia, unspecified: Secondary | ICD-10-CM

## 2019-05-04 LAB — COMPREHENSIVE METABOLIC PANEL
ALT: 19 U/L (ref 0–44)
AST: 17 U/L (ref 15–41)
Albumin: 4 g/dL (ref 3.5–5.0)
Alkaline Phosphatase: 87 U/L (ref 38–126)
Anion gap: 10 (ref 5–15)
BUN: 19 mg/dL (ref 6–20)
CO2: 29 mmol/L (ref 22–32)
Calcium: 9.8 mg/dL (ref 8.9–10.3)
Chloride: 101 mmol/L (ref 98–111)
Creatinine, Ser: 0.84 mg/dL (ref 0.61–1.24)
GFR calc Af Amer: >60 mL/min (ref >60)
GFR calc non Af Amer: >60 mL/min (ref >60)
Glucose, Bld: 128 mg/dL — ABNORMAL HIGH (ref 70–99)
Potassium: 4.9 mmol/L (ref 3.5–5.1)
Sodium: 140 mmol/L (ref 135–145)
Total Bilirubin: 0.6 mg/dL (ref 0.3–1.2)
Total Protein: 7.9 g/dL (ref 6.5–8.1)

## 2019-05-04 LAB — LIPID PANEL
Cholesterol: 254 mg/dL — ABNORMAL HIGH (ref 0–200)
HDL: 42 mg/dL (ref >40)
LDL Cholesterol: 187 mg/dL — ABNORMAL HIGH (ref 0–99)
Total CHOL/HDL Ratio: 6 RATIO
Triglycerides: 125 mg/dL (ref <150)
VLDL: 25 mg/dL (ref 0–40)

## 2019-05-04 LAB — HEMOGLOBIN A1C
Hgb A1c MFr Bld: 6 % — ABNORMAL HIGH (ref 4.8–5.6)
Mean Plasma Glucose: 125.5 mg/dL

## 2019-05-26 ENCOUNTER — Encounter: Payer: Self-pay | Admitting: Physician Assistant

## 2019-05-26 ENCOUNTER — Ambulatory Visit: Payer: Medicaid Other | Admitting: Physician Assistant

## 2019-05-26 VITALS — BP 143/86

## 2019-05-26 DIAGNOSIS — F17200 Nicotine dependence, unspecified, uncomplicated: Secondary | ICD-10-CM

## 2019-05-26 DIAGNOSIS — R7303 Prediabetes: Secondary | ICD-10-CM

## 2019-05-26 DIAGNOSIS — I10 Essential (primary) hypertension: Secondary | ICD-10-CM

## 2019-05-26 DIAGNOSIS — E785 Hyperlipidemia, unspecified: Secondary | ICD-10-CM

## 2019-05-26 MED ORDER — LISINOPRIL 20 MG PO TABS: 20.0000 mg | ORAL_TABLET | Freq: Every day | ORAL | 3 refills | Status: DC

## 2019-05-26 NOTE — Progress Notes (Signed)
BP (!) 143/86    Subjective:    Patient ID: Douglas Dorsey, male    DOB: 1976-03-11, 43 y.o.   MRN: [id number]  HPI: Douglas Dorsey is a 43 y.o. male presenting on 05/26/2019 for No chief complaint on file.   HPI   This is a telemedicine appointment due to coronavirus pandemic.  It is via telephone because pt changed his number and doesn't have time to wait for link to go through to connect to his new phone with [employer]  I connected with  [name] on 05/26/19 by a video enabled telemedicine application and verified that I am speaking with the correct person using two identifiers.   I discussed the limitations of evaluation and management by telemedicine. The patient expressed understanding and agreed to proceed.  Pt is at home.  Provider is at office.   Pt says he feels good.  He has been checking his bp at home.  Today it was as listed.  He says that is usual for him recently.   He says he is feeling well and he has no complaints    Relevant past medical, surgical, family and social history reviewed and updated as indicated. Interim medical history since our last visit reviewed. Allergies and medications reviewed and updated.   Current Outpatient Medications:  .  lisinopril (ZESTRIL) 10 MG tablet, Take 1 tablet (10 mg total) by mouth daily., Disp: 30 tablet, Rfl: 1 .  omeprazole (PRILOSEC) 20 MG capsule, Take 40 mg by mouth daily., Disp: , Rfl:  .  simvastatin (ZOCOR) 20 MG tablet, Take 1 tablet (20 mg total) by mouth at bedtime., Disp: 30 tablet, Rfl: 1    Review of Systems  Per HPI unless specifically indicated above     Objective:    BP (!) 143/86   Wt Readings from Last 3 Encounters:  04/29/19 240 lb (108.9 kg)  06/10/18 222 lb (100.7 kg)  01/07/18 230 lb (104.3 kg)    Physical Exam Pulmonary:     Breath sounds: No wheezing.  Neurological:     Mental Status: [name] alert and oriented to person, place, and time.  Psychiatric:        Attention and Perception:  Attention normal.        Speech: Speech normal.        Behavior: Behavior is cooperative.     Results for orders placed or performed during the hospital encounter of 05/04/19  Hemoglobin A1c  Result Value Ref Range   Hgb A1c MFr Bld 6.0 (H) 4.8 - 5.6 %   Mean Plasma Glucose 125.5 mg/dL  Comprehensive metabolic panel  Result Value Ref Range   Sodium 140 135 - 145 mmol/L   Potassium 4.9 3.5 - 5.1 mmol/L   Chloride 101 98 - 111 mmol/L   CO2 29 22 - 32 mmol/L   Glucose, Bld 128 (H) 70 - 99 mg/dL   BUN 19 6 - 20 mg/dL   Creatinine, Ser 0.84 0.61 - 1.24 mg/dL   Calcium 9.8 8.9 - 10.3 mg/dL   Total Protein 7.9 6.5 - 8.1 g/dL   Albumin 4.0 3.5 - 5.0 g/dL   AST 17 15 - 41 U/L   ALT 19 0 - 44 U/L   Alkaline Phosphatase 87 38 - 126 U/L   Total Bilirubin 0.6 0.3 - 1.2 mg/dL   GFR calc non Af Amer >60 >60 mL/min   GFR calc Af Amer >60 >60 mL/min   Anion gap  10 5 - 15  Lipid panel  Result Value Ref Range   Cholesterol 254 (H) 0 - 200 mg/dL   Triglycerides 125 <150 mg/dL   HDL 42 >40 mg/dL   Total CHOL/HDL Ratio 6.0 RATIO   VLDL 25 0 - 40 mg/dL   LDL Cholesterol 187 (H) 0 - 99 mg/dL      Assessment & Plan:    Encounter Diagnoses  Name Primary?  . Essential hypertension Yes  . Hyperlipidemia, unspecified hyperlipidemia type   . Tobacco use disorder   . Prediabetes      -reviewed labs iwht pt -will Increase lisinopril for htn -pt to Continue simvastatiin for lipids and counseled on lowfat diet.  This rx was just restarted at last OV at same time he got labs drawn -pt counseled on prediabetes- encouraged nutritious low carb diet, regular exercise, weight loss, to decrease chances of progression to diabetes. -pt to follow up in 2 months to recheck htn, lipids.  [name] to contact office sooner prn

## 2019-06-23 ENCOUNTER — Ambulatory Visit: Payer: Medicaid Other | Admitting: Physician Assistant

## 2019-06-23 ENCOUNTER — Encounter: Payer: Self-pay | Admitting: Physician Assistant

## 2019-06-23 VITALS — BP 136/82

## 2019-06-23 DIAGNOSIS — Z8719 Personal history of other diseases of the digestive system: Secondary | ICD-10-CM

## 2019-06-23 DIAGNOSIS — M79651 Pain in right thigh: Secondary | ICD-10-CM

## 2019-06-23 MED ORDER — DICLOFENAC SODIUM 1 % EX GEL: 2.0000 g | Freq: Four times a day (QID) | CUTANEOUS | 0 refills | Status: DC

## 2019-06-23 NOTE — Progress Notes (Signed)
BP 136/82    Subjective:    Patient ID: Douglas Dorsey, male    DOB: [birth year], 43 y.o.   MRN: [id number]  HPI: Douglas Dorsey is a 43 y.o. male presenting on 06/23/2019 for No chief complaint on file.   HPI  This is a telemedicine appointment through [employer] due to coronavirus pandemic  I connected with  Douglas Dorsey on 06/23/19 by a video enabled telemedicine application and verified that I am speaking with the correct person using two identifiers.   I discussed the limitations of evaluation and management by telemedicine. The patient expressed understanding and agreed to proceed.  Pt is at home.  Provider is at office.      Pt is Still working at [employer] - it is going good.  [name] still working thru [employer] today is for thigh issue.  He says his R outer thigh hurts- feels like it's burning.  Pain started " a while ago"  but worse over last 3 weeks.  For several years, he thinks (yet he has never mentioned it at any previous appointments).. he says  "It kind of hurts when he touches" it but then "it kind feels numb at times like chicken".  It is about half of the thigh height-  Just the lateral portion.  Area about 2 palms in size.  He says there is no redness,  No swelling, no rash.  The area hurts and burns.    He tried apap and ibu- no topicals.  He says he has no pain when he lays down it helps.  Standing up what makes it "bad".    [name] having No lower leg swelling but he has some feet swelling, he says, due to heel spurs and plantar fasicits.  He denies varicose veins.    Relevant past medical, surgical, family and social history reviewed and updated as indicated. Interim medical history since our last visit reviewed. Allergies and medications reviewed and updated.   Current Outpatient Medications:  .  lisinopril (ZESTRIL) 20 MG tablet, Take 1 tablet (20 mg total) by mouth daily., Disp: 30 tablet, Rfl: 3 .  omeprazole (PRILOSEC) 20 MG capsule, Take 40 mg by mouth  daily., Disp: , Rfl:  .  simvastatin (ZOCOR) 20 MG tablet, Take 1 tablet (20 mg total) by mouth at bedtime., Disp: 30 tablet, Rfl: 1   Review of Systems  Per HPI unless specifically indicated above     Objective:    BP 136/82   Wt Readings from Last 3 Encounters:  04/29/19 240 lb (108.9 kg)  06/10/18 222 lb (100.7 kg)  01/07/18 230 lb (104.3 kg)    Physical Exam Constitutional:      General: [name] not in acute distress.    Appearance: Normal appearance. [name] obese. [name] not ill-appearing.  HENT:     Head: Normocephalic and atraumatic.  Pulmonary:     Effort: Pulmonary effort is normal. No respiratory distress.  Musculoskeletal:     Right upper leg: He exhibits no swelling, no edema and no deformity.     Comments: No abnormalities seen on the right thigh  Neurological:     Mental Status: [name] alert.         Assessment & Plan:    Encounter Diagnoses  Name Primary?  . Pain of right thigh Yes  . History of Barrett's esophagus       Re-refer to GI for hx barretts (pt  with history of Barrett's esophagus.  Last EGD 2016.  He will need to be referred to GI for re-evaluation)  rx voltaren gel for the thigh discomfort.  Counseled that smoking cessation and weight loss both may be of benefit to this in particular but definitely to overall health and well being.   F/u january as scheduled

## 2019-06-28 ENCOUNTER — Encounter: Payer: Self-pay | Admitting: Internal Medicine

## 2019-07-12 ENCOUNTER — Telehealth: Payer: Self-pay | Admitting: Internal Medicine

## 2019-07-12 ENCOUNTER — Ambulatory Visit: Payer: Self-pay | Admitting: Gastroenterology

## 2019-07-12 ENCOUNTER — Encounter: Payer: Self-pay | Admitting: Internal Medicine

## 2019-07-12 NOTE — Telephone Encounter (Signed)
PATIENT WAS A NO SHOW AND LETTER SENT  °

## 2019-08-17 ENCOUNTER — Ambulatory Visit: Payer: Medicaid Other | Admitting: Physician Assistant

## 2019-08-23 ENCOUNTER — Ambulatory Visit: Payer: Medicaid Other | Admitting: Physician Assistant

## 2019-08-23 ENCOUNTER — Encounter: Payer: Self-pay | Admitting: Physician Assistant

## 2019-08-23 DIAGNOSIS — Z9119 Patient's noncompliance with other medical treatment and regimen: Secondary | ICD-10-CM

## 2019-08-23 DIAGNOSIS — Z8719 Personal history of other diseases of the digestive system: Secondary | ICD-10-CM

## 2019-08-23 DIAGNOSIS — E669 Obesity, unspecified: Secondary | ICD-10-CM

## 2019-08-23 DIAGNOSIS — Z91199 Patient's noncompliance with other medical treatment and regimen due to unspecified reason: Secondary | ICD-10-CM

## 2019-08-23 DIAGNOSIS — R7303 Prediabetes: Secondary | ICD-10-CM

## 2019-08-23 DIAGNOSIS — K219 Gastro-esophageal reflux disease without esophagitis: Secondary | ICD-10-CM

## 2019-08-23 DIAGNOSIS — F17200 Nicotine dependence, unspecified, uncomplicated: Secondary | ICD-10-CM

## 2019-08-23 DIAGNOSIS — E785 Hyperlipidemia, unspecified: Secondary | ICD-10-CM

## 2019-08-23 DIAGNOSIS — I10 Essential (primary) hypertension: Secondary | ICD-10-CM

## 2019-08-23 NOTE — Progress Notes (Signed)
There were no vitals taken for this visit.   Subjective:    Patient ID: Douglas Dorsey, male    DOB: 1976-05-07, 44 y.o.   MRN: [id number]  HPI: Douglas Dorsey is a 44 y.o. male presenting on 08/23/2019 for No chief complaint on file.   HPI   This is a telemedicine appointment due to coronavirus pandemic.  It is via telephone today as pt was unable to connect through [employer]  I connected with  [name] on 08/23/19 by a video enabled telemedicine application and verified that I am speaking with the correct person using two identifiers.   I discussed the limitations of evaluation and management by telemedicine. The patient expressed understanding and agreed to proceed.  Pt is at home.  Provider is at office.    Pt is 69yoM with htn and dyslipidemia.  Pt hopes to get on insurance soon.  He says it opens / becomes available in aonther 6 wk or so  Pt is out of his meds and he didn't get labs drawn.  He says it's because he's been workiing 12hour shifts recently.  Pt was a no-show to GI appointment in December.  He has history of Barretts esophagus.   He says he feels fine other than being tired.      Relevant past medical, surgical, family and social history reviewed and updated as indicated. Interim medical history since our last visit reviewed. Allergies and medications reviewed and updated.    Current Outpatient Medications:  .  omeprazole (PRILOSEC) 20 MG capsule, Take 40 mg by mouth daily., Disp: , Rfl:  .  lisinopril (ZESTRIL) 20 MG tablet, Take 1 tablet (20 mg total) by mouth daily. (Patient not taking: Reported on 08/23/2019), Disp: 30 tablet, Rfl: 3 .  simvastatin (ZOCOR) 20 MG tablet, Take 1 tablet (20 mg total) by mouth at bedtime. (Patient not taking: Reported on 08/23/2019), Disp: 30 tablet, Rfl: 1   Review of Systems  Per HPI unless specifically indicated above     Objective:    There were no vitals taken for this visit.  Wt Readings from Last 3 Encounters:   04/29/19 240 lb (108.9 kg)  06/10/18 222 lb (100.7 kg)  01/07/18 230 lb (104.3 kg)    BP deferred due to pt being off his medications and having difficulty finding his machine today   Physical Exam Pulmonary:     Effort: Pulmonary effort is normal. No respiratory distress.  Neurological:     Mental Status: [name] alert and oriented to person, place, and time.  Psychiatric:        Attention and Perception: Attention normal.        Speech: Speech normal.        Behavior: Behavior is cooperative.            Assessment & Plan:   Encounter Diagnoses  Name Primary?  . Essential hypertension Yes  . Hyperlipidemia, unspecified hyperlipidemia type   . Prediabetes   . Tobacco use disorder   . Obesity, unspecified classification, unspecified obesity type, unspecified whether serious comorbidity present   . Gastroesophageal reflux disease, unspecified whether esophagitis present   . History of Barrett's esophagus   . Personal history of noncompliance with medical treatment, presenting hazards to health      -pt encouraged to get back on his medications.  Discussed damage to body by uncontrolled htn and lipids -pt to get labs drawn.  He will be called with results -encouraged pt  to reschedule GI appointment -pt will be scheduled for follow up in 3 months.  [name] to notify office if he gets on insurance prior to that time

## 2019-11-29 ENCOUNTER — Ambulatory Visit: Payer: Medicaid Other | Admitting: Physician Assistant

## 2019-12-03 ENCOUNTER — Other Ambulatory Visit (HOSPITAL_COMMUNITY)
Admission: RE | Admit: 2019-12-03 | Discharge: 2019-12-03 | Disposition: A | Discharge status: Home / Self Care | Payer: Medicaid Other | Source: Ambulatory Visit | Attending: Physician Assistant | Admitting: Physician Assistant

## 2019-12-03 DIAGNOSIS — E785 Hyperlipidemia, unspecified: Secondary | ICD-10-CM

## 2019-12-03 DIAGNOSIS — I10 Essential (primary) hypertension: Secondary | ICD-10-CM | POA: Insufficient documentation

## 2019-12-03 LAB — LIPID PANEL
Cholesterol: 238 mg/dL — ABNORMAL HIGH (ref 0–200)
HDL: 47 mg/dL (ref >40)
LDL Cholesterol: 165 mg/dL — ABNORMAL HIGH (ref 0–99)
Total CHOL/HDL Ratio: 5.1 RATIO
Triglycerides: 131 mg/dL (ref <150)
VLDL: 26 mg/dL (ref 0–40)

## 2019-12-03 LAB — COMPREHENSIVE METABOLIC PANEL
ALT: 18 U/L (ref 0–44)
AST: 17 U/L (ref 15–41)
Albumin: 4.1 g/dL (ref 3.5–5.0)
Alkaline Phosphatase: 92 U/L (ref 38–126)
Anion gap: 9 (ref 5–15)
BUN: 26 mg/dL — ABNORMAL HIGH (ref 6–20)
CO2: 24 mmol/L (ref 22–32)
Calcium: 9.6 mg/dL (ref 8.9–10.3)
Chloride: 100 mmol/L (ref 98–111)
Creatinine, Ser: 0.91 mg/dL (ref 0.61–1.24)
GFR calc Af Amer: >60 mL/min (ref >60)
GFR calc non Af Amer: >60 mL/min (ref >60)
Glucose, Bld: 109 mg/dL — ABNORMAL HIGH (ref 70–99)
Potassium: 4.7 mmol/L (ref 3.5–5.1)
Sodium: 133 mmol/L — ABNORMAL LOW (ref 135–145)
Total Bilirubin: 0.4 mg/dL (ref 0.3–1.2)
Total Protein: 8 g/dL (ref 6.5–8.1)

## 2019-12-08 ENCOUNTER — Encounter: Payer: Self-pay | Admitting: Physician Assistant

## 2019-12-08 ENCOUNTER — Other Ambulatory Visit: Payer: Self-pay

## 2019-12-08 ENCOUNTER — Ambulatory Visit: Payer: Medicaid Other | Admitting: Physician Assistant

## 2019-12-08 VITALS — BP 120/80 | HR 98 | Temp 98.1°F | Wt 241.8 lb

## 2019-12-08 DIAGNOSIS — F17200 Nicotine dependence, unspecified, uncomplicated: Secondary | ICD-10-CM

## 2019-12-08 DIAGNOSIS — E785 Hyperlipidemia, unspecified: Secondary | ICD-10-CM

## 2019-12-08 DIAGNOSIS — E669 Obesity, unspecified: Secondary | ICD-10-CM

## 2019-12-08 DIAGNOSIS — I10 Essential (primary) hypertension: Secondary | ICD-10-CM

## 2019-12-08 DIAGNOSIS — Z8719 Personal history of other diseases of the digestive system: Secondary | ICD-10-CM

## 2019-12-08 DIAGNOSIS — Z2821 Immunization not carried out because of patient refusal: Secondary | ICD-10-CM

## 2019-12-08 MED ORDER — ATORVASTATIN CALCIUM 20 MG PO TABS: 20.0000 mg | ORAL_TABLET | Freq: Every day | ORAL | 0 refills | Status: DC

## 2019-12-08 MED ORDER — OMEPRAZOLE 20 MG PO CPDR: 40.0000 mg | DELAYED_RELEASE_CAPSULE | Freq: Every day | ORAL | 0 refills | Status: DC

## 2019-12-08 MED ORDER — LISINOPRIL 20 MG PO TABS: 20.0000 mg | ORAL_TABLET | Freq: Every day | ORAL | 1 refills | Status: DC

## 2019-12-08 NOTE — Progress Notes (Signed)
BP 120/80   Pulse 98   Temp 98.1 F (36.7 C)   Wt 241 lb 12.8 oz (109.7 kg)   SpO2 96%   BMI 38.16 kg/m    Subjective:    Patient ID: Douglas Dorsey, male    DOB: 1976-07-23, 44 y.o.   MRN: [id number]  HPI: Douglas Dorsey is a 44 y.o. male presenting on 12/08/2019 for Hypertension and Hyperlipidemia   HPI  Pt had a negative covid 19 screening questionnaire.    Pt is 36yoM with HTN and dyslipidemia.  He had been hoping to get insurance.  Pt has not yet gotten insurance.  [name] still working.  Pt not going to get covid vaccination  He has no complaints today and says he is doing well.      Relevant past medical, surgical, family and social history reviewed and updated as indicated. Interim medical history since our last visit reviewed. Allergies and medications reviewed and updated.   Current Outpatient Medications:  .  lisinopril (ZESTRIL) 20 MG tablet, Take 1 tablet (20 mg total) by mouth daily., Disp: 30 tablet, Rfl: 3 .  omeprazole (PRILOSEC) 20 MG capsule, Take 40 mg by mouth daily., Disp: , Rfl:  .  simvastatin (ZOCOR) 20 MG tablet, Take 1 tablet (20 mg total) by mouth at bedtime., Disp: 30 tablet, Rfl: 1    Review of Systems  Per HPI unless specifically indicated above     Objective:    BP 120/80   Pulse 98   Temp 98.1 F (36.7 C)   Wt 241 lb 12.8 oz (109.7 kg)   SpO2 96%   BMI 38.16 kg/m   Wt Readings from Last 3 Encounters:  12/08/19 241 lb 12.8 oz (109.7 kg)  04/29/19 240 lb (108.9 kg)  06/10/18 222 lb (100.7 kg)    Physical Exam Vitals reviewed.  Constitutional:      General: [name] not in acute distress.    Appearance: [name] well-developed. [name] obese. [name] not ill-appearing.  HENT:     Head: Normocephalic and atraumatic.  Cardiovascular:     Rate and Rhythm: Normal rate and regular rhythm.  Pulmonary:     Effort: Pulmonary effort is normal.     Breath sounds: Normal breath sounds. No wheezing.  Abdominal:     General: Bowel sounds are  normal.     Palpations: Abdomen is soft. There is mass.     Tenderness: There is no abdominal tenderness.     Comments: Mass left abdominal wall that pt says was evaluated in the past and is a lipoma  Musculoskeletal:     Cervical back: Neck supple.  Lymphadenopathy:     Cervical: No cervical adenopathy.  Skin:    General: Skin is warm and dry.  Neurological:     Mental Status: [name] alert and oriented to person, place, and time.  Psychiatric:        Behavior: Behavior normal.       Mass L abd- t says is lipoma   Results for orders placed or performed during the hospital encounter of 12/03/19  Lipid panel  Result Value Ref Range   Cholesterol 238 (H) 0 - 200 mg/dL   Triglycerides 131 <150 mg/dL   HDL 47 >40 mg/dL   Total CHOL/HDL Ratio 5.1 RATIO   VLDL 26 0 - 40 mg/dL   LDL Cholesterol 165 (H) 0 - 99 mg/dL  Comprehensive metabolic panel  Result Value Ref Range  Sodium 133 (L) 135 - 145 mmol/L   Potassium 4.7 3.5 - 5.1 mmol/L   Chloride 100 98 - 111 mmol/L   CO2 24 22 - 32 mmol/L   Glucose, Bld 109 (H) 70 - 99 mg/dL   BUN 26 (H) 6 - 20 mg/dL   Creatinine, Ser 0.91 0.61 - 1.24 mg/dL   Calcium 9.6 8.9 - 10.3 mg/dL   Total Protein 8.0 6.5 - 8.1 g/dL   Albumin 4.1 3.5 - 5.0 g/dL   AST 17 15 - 41 U/L   ALT 18 0 - 44 U/L   Alkaline Phosphatase 92 38 - 126 U/L   Total Bilirubin 0.4 0.3 - 1.2 mg/dL   GFR calc non Af Amer >60 >60 mL/min   GFR calc Af Amer >60 >60 mL/min   Anion gap 9 5 - 15      Assessment & Plan:    Encounter Diagnoses  Name Primary?  . COVID-19 virus vaccination declined Yes  . Hyperlipidemia, unspecified hyperlipidemia type   . Essential hypertension   . Tobacco use disorder   . Obesity, unspecified classification, unspecified obesity type, unspecified whether serious comorbidity present   . History of Barrett's esophagus      -pt to Call GI to reschedule his appointment in reference to his barretts -encouraged smoking cessation -will  Change to atorvastatin as lipids still quite high.  Counseled on lowfat diet -Send rx to [facility] -pt is Not getting covid vaccination -pt to follow up in 3 months.  [name] to contact office sooner prn

## 2020-03-20 ENCOUNTER — Other Ambulatory Visit: Payer: Self-pay | Admitting: Physician Assistant

## 2020-03-20 MED ORDER — ATORVASTATIN CALCIUM 20 MG PO TABS: 20.0000 mg | ORAL_TABLET | Freq: Every day | ORAL | 0 refills | Status: DC

## 2020-03-20 MED ORDER — LISINOPRIL 20 MG PO TABS: 20.0000 mg | ORAL_TABLET | Freq: Every day | ORAL | 1 refills | Status: DC

## 2020-03-22 ENCOUNTER — Ambulatory Visit: Payer: Medicaid Other | Admitting: Physician Assistant

## 2020-04-17 ENCOUNTER — Ambulatory Visit: Payer: Medicaid Other | Admitting: Physician Assistant

## 2020-11-20 ENCOUNTER — Encounter (HOSPITAL_COMMUNITY): Payer: Self-pay | Admitting: *Deleted

## 2020-11-20 ENCOUNTER — Emergency Department (HOSPITAL_COMMUNITY)
Admission: EM | Admit: 2020-11-20 | Discharge: 2020-11-20 | Disposition: A | Discharge status: Home / Self Care | Payer: Worker's Compensation | Attending: Emergency Medicine | Admitting: Emergency Medicine

## 2020-11-20 ENCOUNTER — Other Ambulatory Visit: Payer: Self-pay

## 2020-11-20 DIAGNOSIS — Z79899 Other long term (current) drug therapy: Secondary | ICD-10-CM | POA: Insufficient documentation

## 2020-11-20 DIAGNOSIS — S61511A Laceration without foreign body of right wrist, initial encounter: Secondary | ICD-10-CM | POA: Diagnosis not present

## 2020-11-20 DIAGNOSIS — Y990 Civilian activity done for income or pay: Secondary | ICD-10-CM | POA: Diagnosis not present

## 2020-11-20 DIAGNOSIS — W268XXA Contact with other sharp object(s), not elsewhere classified, initial encounter: Secondary | ICD-10-CM | POA: Diagnosis not present

## 2020-11-20 DIAGNOSIS — Z23 Encounter for immunization: Secondary | ICD-10-CM | POA: Insufficient documentation

## 2020-11-20 DIAGNOSIS — I10 Essential (primary) hypertension: Secondary | ICD-10-CM | POA: Insufficient documentation

## 2020-11-20 DIAGNOSIS — F17210 Nicotine dependence, cigarettes, uncomplicated: Secondary | ICD-10-CM | POA: Diagnosis not present

## 2020-11-20 DIAGNOSIS — S6991XA Unspecified injury of right wrist, hand and finger(s), initial encounter: Secondary | ICD-10-CM | POA: Diagnosis present

## 2020-11-20 MED ORDER — TETANUS-DIPHTH-ACELL PERTUSSIS 5-2.5-18.5 LF-MCG/0.5 IM SUSY
0.5000 mL | PREFILLED_SYRINGE | Freq: Once | INTRAMUSCULAR | Status: AC
  Administered 2020-11-20: 0.5 mL via INTRAMUSCULAR
  Filled 2020-11-20: qty 0.5

## 2020-11-20 MED ORDER — LIDOCAINE HCL (PF) 1 % IJ SOLN
5.0000 mL | Freq: Once | INTRAMUSCULAR | Status: DC
  Filled 2020-11-20: qty 30

## 2020-11-20 NOTE — ED Triage Notes (Signed)
Laceration to right wrist, injured at work, bandaged on arrival

## 2020-11-20 NOTE — Discharge Instructions (Addendum)

## 2020-11-20 NOTE — ED Provider Notes (Signed)
[facility] EMERGENCY DEPARTMENT Provider Note   CSN: [id number] Arrival date & time: 11/20/20  1717     History Chief Complaint  Patient presents with  . Laceration    Douglas Dorsey is a 45 y.o. male.  The history is provided by the patient. No language interpreter was used.  Laceration Location:  Shoulder/arm Shoulder/arm laceration location:  R wrist Length:  3 Depth:  Cutaneous Bleeding: controlled   Time since incident:  1 hour Laceration mechanism:  Metal edge Pain details:    Quality:  Aching   Severity:  Mild   Timing:  Constant   Progression:  Improving Relieved by:  Nothing Worsened by:  Pressure and movement Ineffective treatments:  None tried Tetanus status:  Unknown Associated symptoms: no fever, no focal weakness, no numbness, no rash, no redness, no swelling and no streaking        Past Medical History:  Diagnosis Date  . Barrett's esophagus 2016  . Colon polyp 2015  . Complication of anesthesia    pt sts "they told me it took longer for them to wake me up than it should have".  . Family history of coronary artery disease 12/24/2011  . GERD (gastroesophageal reflux disease)   . Hypertension   . Tobacco abuse     Patient Active Problem List   Diagnosis Date Noted  . AKI (acute kidney injury) ([place]) 12/25/2016  . Dehydration 12/25/2016  . Cramp in muscle 12/25/2016  . Hx of colonic polyps   . Barrett's esophagus 02/14/2015  . History of adenomatous polyp of colon 02/14/2015  . Plantar fasciitis 06/23/2014  . Bloody stool 12/28/2013  . GERD (gastroesophageal reflux disease) 12/28/2013  . Ankle sprain 02/08/2012  . Cellulitis of foot 12/24/2011  . Tinea pedis 12/24/2011  . Tobacco abuse 12/24/2011  . Family history of coronary artery disease 12/24/2011    Past Surgical History:  Procedure Laterality Date  . COLONOSCOPY N/A 01/20/2014   RMR: Colonic polyps -removed as descrbied above. Friable anal canal hemorrhoids  . COLONOSCOPY N/A  02/22/2015   Procedure: COLONOSCOPY;  Surgeon: Daneil Dolin, MD;  Location: [facility];  Service: Endoscopy;  Laterality: N/A;  1400  . ESOPHAGOGASTRODUODENOSCOPY N/A 01/20/2014   SWF:UXNATFTD EG junction abnoraml distal esophagus- query  short segment Barretts -status post esophageal biospy. Hiatal hernia. Abnormal gastric mucosa-status post gastric biopsy.   . ESOPHAGOGASTRODUODENOSCOPY N/A 02/22/2015   Procedure: ESOPHAGOGASTRODUODENOSCOPY (EGD);  Surgeon: Daneil Dolin, MD;  Location: [facility];  Service: Endoscopy;  Laterality: N/A;  . HERNIA REPAIR     Umbilical  . INCISIONAL HERNIA REPAIR N/A 12/17/2013   Procedure: LAPAROSCOPIC INCISIONAL HERNIA REPAIR WITH MESH;  Surgeon: Jamesetta So, MD;  Location: [facility] OR[facility];  Service: General;  Laterality: N/A;  . INSERTION OF MESH N/A 12/17/2013   Procedure: INSERTION OF MESH;  Surgeon: Jamesetta So, MD;  Location: [facility];  Service: General;  Laterality: N/A;  . MULTIPLE EXTRACTIONS WITH ALVEOLOPLASTY Bilateral 12/20/2016   Procedure: MULTIPLE EXTRACTION OF TEETH NUMBER ONE, THREE, SIX, SEVEN, EIGHT, NINE, TEN, ELEVEN, FOURTEEN, NINETEEN, TWENTY, TWENTY-ONE, TWENTY-TWO, TWENTY-THREE, TWENTY-FOUR, TWENTY- FIVE, TWENTY-SIX, TWENTY-SEVEN, TWENTY-EIGHT, TWENTY-NINE, THIRTY  ;   ALVEOLOPLASTY; BIOPSY LESION OF UVULA;  Surgeon: Diona Browner, DDS;  Location: [place];  Service: Oral Surgery;  Laterality: Bilateral;  . MULTIPLE TOOTH EXTRACTIONS    . teeth extracted     12 teeth removed in [place]       Family History  Problem Relation Age of  Onset  . Diabetes Mother   . Hypertension Mother   . Heart disease Mother   . Lung disease Mother   . Heart failure Father   . Heart attack Father   . Heart attack Brother   . Hypertension Brother   . Heart disease Brother   . Heart disease Sister   . Mental illness Sister   . Seizures Sister   . Heart attack Maternal Grandmother   . Diabetes Maternal Grandmother   . Pneumonia Maternal Grandfather    . COPD Maternal Grandfather   . Emphysema Maternal Grandfather   . Lung disease Maternal Grandfather   . Heart disease Brother   . Mental illness Brother   . Colon cancer Neg Hx   . Inflammatory bowel disease Neg Hx     Social History   Tobacco Use  . Smoking status: Current Every Day Smoker    Packs/day: 1.50    Years: 30.00    Pack years: 45.00    Types: Cigarettes  . Smokeless tobacco: Never Used  Vaping Use  . Vaping Use: Some days  . Last attempt to quit: 08/24/2017  Substance Use Topics  . Alcohol use: No  . Drug use: No    Home Medications Prior to Admission medications   Medication Sig Start Date End Date Taking? Authorizing Provider  atorvastatin (LIPITOR) 20 MG tablet Take 1 tablet (20 mg total) by mouth daily. 03/20/20   Soyla Dryer, PA-C  lisinopril (ZESTRIL) 20 MG tablet Take 1 tablet (20 mg total) by mouth daily. 03/20/20   Soyla Dryer, PA-C  omeprazole (PRILOSEC) 20 MG capsule Take 2 capsules (40 mg total) by mouth daily. 12/08/19   Soyla Dryer, PA-C    Allergies    No known allergies  Review of Systems   Review of Systems  Constitutional: Negative for fever.  Skin: Positive for wound. Negative for rash.  Neurological: Negative for focal weakness, weakness and numbness.    Physical Exam Updated Vital Signs BP (!) 140/104 (BP Location: Left Arm)   Pulse 88   Temp 98.7 F (37.1 C) (Oral)   Resp 19   Ht 5\' 9"  (1.753 m)   Wt 112.9 kg   SpO2 100%   BMI 36.77 kg/m   Physical Exam Vitals and nursing note reviewed.  Constitutional:      General: [name] not in acute distress.    Appearance: [name] well-developed. [name] not diaphoretic.  HENT:     Head: Normocephalic and atraumatic.  Eyes:     General: No scleral icterus.    Conjunctiva/sclera: Conjunctivae normal.  Cardiovascular:     Rate and Rhythm: Normal rate and regular rhythm.     Heart sounds: Normal heart sounds.  Pulmonary:     Effort: Pulmonary effort is normal. No  respiratory distress.     Breath sounds: Normal breath sounds.  Abdominal:     Palpations: Abdomen is soft.     Tenderness: There is no abdominal tenderness.  Musculoskeletal:     Cervical back: Normal range of motion and neck supple.  Skin:    General: Skin is warm and dry.     Comments: Superficial cutaneous laceration R wrist   Neurological:     Mental Status: [name] alert.  Psychiatric:        Behavior: Behavior normal.     ED Results / Procedures / Treatments   Labs (all labs ordered are listed, but only abnormal results are displayed) Labs Reviewed - No  data to display  EKG None  Radiology No results found.  Procedures .Marland KitchenLaceration Repair  Date/Time: 11/20/2020 8:34 PM Performed by: Margarita Mail, PA-C Authorized by: Margarita Mail, PA-C   Consent:    Consent obtained:  Verbal   Consent given by:  Patient   Risks discussed:  Infection, need for additional repair, pain, poor cosmetic result and poor wound healing   Alternatives discussed:  No treatment and delayed treatment Universal protocol:    Procedure explained and questions answered to patient or proxy's satisfaction: yes     Relevant documents present and verified: yes     Test results available: yes     Imaging studies available: yes     Required blood products, implants, devices, and special equipment available: yes     Site/side marked: yes     Immediately prior to procedure, a time out was called: yes     Patient identity confirmed:  Verbally with patient Anesthesia:    Anesthesia method:  Local infiltration   Local anesthetic:  Lidocaine 1% w/o epi Laceration details:    Location:  Shoulder/arm   Shoulder/arm location:  R lower arm   Length (cm):  3   Depth (mm):  3 Pre-procedure details:    Preparation:  Patient was prepped and draped in usual sterile fashion Exploration:    Wound exploration: wound explored through full range of motion   Treatment:    Area cleansed with:  Saline and  povidone-iodine   Amount of cleaning:  Standard   Irrigation solution:  Sterile saline   Irrigation method:  Pressure wash   Debridement:  None Skin repair:    Repair method:  Sutures   Suture size:  5-0   Suture material:  Prolene   Suture technique:  Running locked   Number of sutures:  4 Approximation:    Approximation:  Close Repair type:    Repair type:  Simple Post-procedure details:    Dressing:  Adhesive bandage   Procedure completion:  Tolerated well, no immediate complications    Medications Ordered in ED Medications  lidocaine (PF) (XYLOCAINE) 1 % injection 5 mL (has no administration in time range)  Tdap (BOOSTRIX) injection 0.5 mL (0.5 mLs Intramuscular Given 11/20/20 1823)    ED Course  I have reviewed the triage vital signs and the nursing notes.  Pertinent labs & imaging results that were available during my care of the patient were reviewed by me and considered in my medical decision making (see chart for details).    MDM Rules/Calculators/A&P                           KELEN LAURA is a 45 y.o. male who presents to ED for laceration of R wrist. Wound thoroughly cleaned in ED today. Wound explored and bottom of wound seen in a bloodless field. Laceration repaired as dictated above. Patient counseled on home wound care. Follow up with PCP/urgent care or return to ER for suture removal in  7 days. Patient was urged to return to the Emergency Department for worsening pain, swelling, expanding erythema especially if it streaks away from the affected area, fever, or for any additional concerns. Patient verbalized understanding. All questions answered.  Final Clinical Impression(s) / ED Diagnoses Final diagnoses:  Laceration of right wrist, initial encounter    Rx / DC Orders ED Discharge Orders    None       Margarita Mail, PA-C 11/20/20 2036  Hayden Rasmussen, MD 11/21/20 1038

## 2021-02-07 ENCOUNTER — Telehealth: Payer: Self-pay

## 2021-02-07 NOTE — Telephone Encounter (Signed)
Pt contacted Reva Bores by phone to receive assistance with being referred back into the [employer] [facility] uninsured program due to previously being expired with the program 04/2020    Plan Referral made and Enrollment appt scheduled with [facility] uninsured program for 7.8.22 at 11am

## 2021-02-21 ENCOUNTER — Ambulatory Visit: Payer: Medicaid Other | Admitting: Physician Assistant

## 2021-02-21 ENCOUNTER — Telehealth: Payer: Self-pay

## 2021-02-21 NOTE — Telephone Encounter (Signed)
Called to follow up with client that had a [facility] appointment scheduled for this morning to re-establish care. No answer left voicemail requesting return call.   Douglas Dorsey

## 2021-02-28 ENCOUNTER — Ambulatory Visit: Payer: Medicaid Other | Admitting: Physician Assistant

## 2021-03-08 ENCOUNTER — Encounter: Payer: Self-pay | Admitting: Physician Assistant

## 2021-04-12 ENCOUNTER — Other Ambulatory Visit (HOSPITAL_COMMUNITY)
Admission: RE | Admit: 2021-04-12 | Discharge: 2021-04-12 | Disposition: A | Discharge status: Home / Self Care | Payer: Medicaid Other | Source: Ambulatory Visit | Attending: Physician Assistant | Admitting: Physician Assistant

## 2021-04-12 ENCOUNTER — Other Ambulatory Visit: Payer: Self-pay

## 2021-04-12 ENCOUNTER — Ambulatory Visit: Payer: Medicaid Other | Admitting: Physician Assistant

## 2021-04-12 ENCOUNTER — Encounter: Payer: Self-pay | Admitting: Physician Assistant

## 2021-04-12 VITALS — BP 148/83 | HR 90 | Temp 97.1°F | Wt 270.0 lb

## 2021-04-12 DIAGNOSIS — K469 Unspecified abdominal hernia without obstruction or gangrene: Secondary | ICD-10-CM

## 2021-04-12 DIAGNOSIS — R7303 Prediabetes: Secondary | ICD-10-CM

## 2021-04-12 DIAGNOSIS — Z7689 Persons encountering health services in other specified circumstances: Secondary | ICD-10-CM

## 2021-04-12 DIAGNOSIS — I10 Essential (primary) hypertension: Secondary | ICD-10-CM

## 2021-04-12 DIAGNOSIS — Z8719 Personal history of other diseases of the digestive system: Secondary | ICD-10-CM

## 2021-04-12 DIAGNOSIS — F17200 Nicotine dependence, unspecified, uncomplicated: Secondary | ICD-10-CM

## 2021-04-12 DIAGNOSIS — E669 Obesity, unspecified: Secondary | ICD-10-CM

## 2021-04-12 DIAGNOSIS — E785 Hyperlipidemia, unspecified: Secondary | ICD-10-CM | POA: Insufficient documentation

## 2021-04-12 DIAGNOSIS — K219 Gastro-esophageal reflux disease without esophagitis: Secondary | ICD-10-CM

## 2021-04-12 LAB — COMPREHENSIVE METABOLIC PANEL
ALT: 24 U/L (ref 0–44)
AST: 19 U/L (ref 15–41)
Albumin: 4 g/dL (ref 3.5–5.0)
Alkaline Phosphatase: 100 U/L (ref 38–126)
Anion gap: 7 (ref 5–15)
BUN: 19 mg/dL (ref 6–20)
CO2: 28 mmol/L (ref 22–32)
Calcium: 9.3 mg/dL (ref 8.9–10.3)
Chloride: 99 mmol/L (ref 98–111)
Creatinine, Ser: 0.88 mg/dL (ref 0.61–1.24)
GFR, Estimated: >60 mL/min (ref >60)
Glucose, Bld: 222 mg/dL — ABNORMAL HIGH (ref 70–99)
Potassium: 4.7 mmol/L (ref 3.5–5.1)
Sodium: 134 mmol/L — ABNORMAL LOW (ref 135–145)
Total Bilirubin: 0.3 mg/dL (ref 0.3–1.2)
Total Protein: 7.8 g/dL (ref 6.5–8.1)

## 2021-04-12 LAB — LIPID PANEL
Cholesterol: 252 mg/dL — ABNORMAL HIGH (ref 0–200)
HDL: 36 mg/dL — ABNORMAL LOW (ref >40)
LDL Cholesterol: 171 mg/dL — ABNORMAL HIGH (ref 0–99)
Total CHOL/HDL Ratio: 7 RATIO
Triglycerides: 223 mg/dL — ABNORMAL HIGH (ref <150)
VLDL: 45 mg/dL — ABNORMAL HIGH (ref 0–40)

## 2021-04-12 LAB — HEMOGLOBIN A1C
Hgb A1c MFr Bld: 8.1 % — ABNORMAL HIGH (ref 4.8–5.6)
Mean Plasma Glucose: 185.77 mg/dL

## 2021-04-12 MED ORDER — LISINOPRIL 20 MG PO TABS: 20.0000 mg | ORAL_TABLET | Freq: Every day | ORAL | 1 refills | Status: DC

## 2021-04-12 NOTE — Progress Notes (Signed)
BP (!) 148/83   Pulse 90   Temp (!) 97.1 F (36.2 C)   Wt 270 lb (122.5 kg)   SpO2 95%   BMI 39.87 kg/m    Subjective:    Patient ID: Douglas Dorsey, male    DOB: 12-Jan-1976, 45 y.o.   MRN: [id number]  HPI: Douglas Dorsey is a 45 y.o. male presenting on 04/12/2021 for New Patient (Initial Visit)   HPI   Pt had a negative covid 19 screening questionnaire.   Chief Complaint  Patient presents with   New Patient (Initial Visit)     Pt is 50yoM who presents to re-establish care.  He was Last seen here on  12/08/19.  He says he Just started a new job at [employer].    He has been checking his bs recently and it has been high.  He has not gotten covid vaccination.  He continues to smoke and reports occasional cough.  He will have some sob if he is working and it's really hot but otherwise no sob.   Pt did not reschedule his appointment with GI as recommended.  [name] past due for recheck of Barretts esophagus    Relevant past medical, surgical, family and social history reviewed and updated as indicated. Interim medical history since our last visit reviewed. Allergies and medications reviewed and updated.   Current Outpatient Medications:    omeprazole (PRILOSEC) 20 MG capsule, Take 2 capsules (40 mg total) by mouth daily., Disp: 90 capsule, Rfl: 0   atorvastatin (LIPITOR) 20 MG tablet, Take 1 tablet (20 mg total) by mouth daily. (Patient not taking: Reported on 04/12/2021), Disp: 30 tablet, Rfl: 0   lisinopril (ZESTRIL) 20 MG tablet, Take 1 tablet (20 mg total) by mouth daily. (Patient not taking: Reported on 04/12/2021), Disp: 30 tablet, Rfl: 1    Review of Systems  Per HPI unless specifically indicated above     Objective:    BP (!) 148/83   Pulse 90   Temp (!) 97.1 F (36.2 C)   Wt 270 lb (122.5 kg)   SpO2 95%   BMI 39.87 kg/m   Wt Readings from Last 3 Encounters:  04/12/21 270 lb (122.5 kg)  11/20/20 249 lb (112.9 kg)  12/08/19 241 lb 12.8 oz (109.7  kg)    Physical Exam Vitals reviewed.  Constitutional:      General: [name] not in acute distress.    Appearance: [name] well-developed. [name] obese. [name] not ill-appearing.  HENT:     Head: Normocephalic and atraumatic.     Right Ear: Tympanic membrane, ear canal and external ear normal.     Left Ear: Tympanic membrane, ear canal and external ear normal.  Eyes:     Extraocular Movements: Extraocular movements intact.     Conjunctiva/sclera: Conjunctivae normal.     Pupils: Pupils are equal, round, and reactive to light.  Neck:     Thyroid: No thyromegaly.  Cardiovascular:     Rate and Rhythm: Normal rate and regular rhythm.  Pulmonary:     Effort: Pulmonary effort is normal.     Breath sounds: Normal breath sounds. No wheezing or rales.  Abdominal:     General: Bowel sounds are normal.     Palpations: Abdomen is soft. There is no mass.     Tenderness: There is no abdominal tenderness.     Hernia: A hernia is present. Hernia is present in the ventral area.  Musculoskeletal:  Cervical back: Neck supple.     Right lower leg: No edema.     Left lower leg: No edema.  Lymphadenopathy:     Cervical: No cervical adenopathy.  Skin:    General: Skin is warm and dry.     Findings: No rash.  Neurological:     Mental Status: [name] alert and oriented to person, place, and time.     Motor: No weakness or tremor.     Gait: Gait is intact.  Psychiatric:        Behavior: Behavior normal.          Assessment & Plan:    Encounter Diagnoses  Name Primary?   Encounter to establish care Yes   Essential hypertension    Hyperlipidemia, unspecified hyperlipidemia type    Tobacco use disorder    Obesity, unspecified classification, unspecified obesity type, unspecified whether serious comorbidity present    History of Barrett's esophagus    Prediabetes    Gastroesophageal reflux disease, unspecified whether esophagitis present    Abdominal hernia without obstruction and without  gangrene, recurrence not specified, unspecified hernia type       -will Re-refer to GI for barretts.  Pt is encouraged to avoid further no-shows -pt is given application for [employer] financial assistance -Restart lisinopril -Update labs- will wait to start other meds until after reviewing labs -educated pt and encouraged him to get covid vaccination -encouraged smoking cessation -pt to follow up 2 weeks to recheck bp and review labs.  [name] to contact office sooner prn

## 2021-04-17 ENCOUNTER — Emergency Department (HOSPITAL_COMMUNITY)
Admission: EM | Admit: 2021-04-17 | Discharge: 2021-04-18 | Disposition: A | Discharge status: Home / Self Care | Payer: Self-pay | Attending: Emergency Medicine | Admitting: Emergency Medicine

## 2021-04-17 ENCOUNTER — Other Ambulatory Visit: Payer: Self-pay

## 2021-04-17 ENCOUNTER — Encounter (HOSPITAL_COMMUNITY): Payer: Self-pay | Admitting: Emergency Medicine

## 2021-04-17 DIAGNOSIS — Z28310 Unvaccinated for covid-19: Secondary | ICD-10-CM | POA: Insufficient documentation

## 2021-04-17 DIAGNOSIS — I10 Essential (primary) hypertension: Secondary | ICD-10-CM | POA: Insufficient documentation

## 2021-04-17 DIAGNOSIS — F17210 Nicotine dependence, cigarettes, uncomplicated: Secondary | ICD-10-CM | POA: Insufficient documentation

## 2021-04-17 DIAGNOSIS — E119 Type 2 diabetes mellitus without complications: Secondary | ICD-10-CM | POA: Insufficient documentation

## 2021-04-17 DIAGNOSIS — Z79899 Other long term (current) drug therapy: Secondary | ICD-10-CM | POA: Insufficient documentation

## 2021-04-17 DIAGNOSIS — U071 COVID-19: Secondary | ICD-10-CM | POA: Insufficient documentation

## 2021-04-17 HISTORY — DX: Type 2 diabetes mellitus without complications: E11.9

## 2021-04-17 NOTE — ED Triage Notes (Signed)
Pt c/o sob, cough and upper back pain since yesterday.

## 2021-04-18 ENCOUNTER — Emergency Department (HOSPITAL_COMMUNITY): Payer: Self-pay

## 2021-04-18 LAB — COMPREHENSIVE METABOLIC PANEL
ALT: 23 U/L (ref 0–44)
AST: 20 U/L (ref 15–41)
Albumin: 3.9 g/dL (ref 3.5–5.0)
Alkaline Phosphatase: 96 U/L (ref 38–126)
Anion gap: 8 (ref 5–15)
BUN: 13 mg/dL (ref 6–20)
CO2: 29 mmol/L (ref 22–32)
Calcium: 9.2 mg/dL (ref 8.9–10.3)
Chloride: 97 mmol/L — ABNORMAL LOW (ref 98–111)
Creatinine, Ser: 0.8 mg/dL (ref 0.61–1.24)
GFR, Estimated: >60 mL/min (ref >60)
Glucose, Bld: 167 mg/dL — ABNORMAL HIGH (ref 70–99)
Potassium: 4.2 mmol/L (ref 3.5–5.1)
Sodium: 134 mmol/L — ABNORMAL LOW (ref 135–145)
Total Bilirubin: 0.3 mg/dL (ref 0.3–1.2)
Total Protein: 7.7 g/dL (ref 6.5–8.1)

## 2021-04-18 LAB — CBC WITH DIFFERENTIAL/PLATELET
Abs Immature Granulocytes: 0.06 10*3/uL (ref 0.00–0.07)
Basophils Absolute: 0 10*3/uL (ref 0.0–0.1)
Basophils Relative: 0 %
Eosinophils Absolute: 0.1 10*3/uL (ref 0.0–0.5)
Eosinophils Relative: 1 %
HCT: 46.5 % (ref 39.0–52.0)
Hemoglobin: 15.2 g/dL (ref 13.0–17.0)
Immature Granulocytes: 1 %
Lymphocytes Relative: 10 %
Lymphs Abs: 0.8 10*3/uL (ref 0.7–4.0)
MCH: 29.1 pg (ref 26.0–34.0)
MCHC: 32.7 g/dL (ref 30.0–36.0)
MCV: 88.9 fL (ref 80.0–100.0)
Monocytes Absolute: 0.5 10*3/uL (ref 0.1–1.0)
Monocytes Relative: 7 %
Neutro Abs: 6.2 10*3/uL (ref 1.7–7.7)
Neutrophils Relative %: 81 %
Platelets: 182 10*3/uL (ref 150–400)
RBC: 5.23 MIL/uL (ref 4.22–5.81)
RDW: 15 % (ref 11.5–15.5)
WBC: 7.6 10*3/uL (ref 4.0–10.5)
nRBC: 0 % (ref 0.0–0.2)

## 2021-04-18 LAB — RESP PANEL BY RT-PCR (FLU A&B, COVID) ARPGX2
Influenza A by PCR: NEGATIVE
Influenza B by PCR: NEGATIVE
SARS Coronavirus 2 by RT PCR: POSITIVE — AB

## 2021-04-18 MED ORDER — ACETAMINOPHEN 325 MG PO TABS
650.0000 mg | ORAL_TABLET | Freq: Once | ORAL | Status: AC
  Administered 2021-04-18: 650 mg via ORAL
  Filled 2021-04-18: qty 2

## 2021-04-18 MED ORDER — ALBUTEROL SULFATE HFA 108 (90 BASE) MCG/ACT IN AERS
2.0000 | INHALATION_SPRAY | Freq: Once | RESPIRATORY_TRACT | Status: AC
  Administered 2021-04-18: 2 via RESPIRATORY_TRACT
  Filled 2021-04-18: qty 6.7

## 2021-04-18 MED ORDER — NIRMATRELVIR/RITONAVIR (PAXLOVID)TABLET: 3.0000 | ORAL_TABLET | Freq: Two times a day (BID) | ORAL | 0 refills | Status: AC

## 2021-04-18 NOTE — ED Provider Notes (Signed)
[facility] EMERGENCY DEPARTMENT Provider Note   CSN: [id number] Arrival date & time: 04/17/21  2308     History Chief Complaint  Patient presents with   Shortness of Breath    Douglas Dorsey is a 45 y.o. male.  The history is provided by the patient.  Shortness of Breath Severity:  Moderate Onset quality:  Gradual Duration:  1 day Timing:  Intermittent Progression:  Worsening Chronicity:  New Relieved by:  Rest Worsened by:  Activity Associated symptoms: cough   Associated symptoms: no chest pain, no fever and no hemoptysis   Patient with history of diabetes, hypertension presents with cough That started yesterday.  He reports increasing shortness of breath and dyspnea on exertion.  Patient is a smoker.  [name] not vaccinated for COVID    Past Medical History:  Diagnosis Date   Barrett's esophagus 2016   Colon polyp [id number]   Complication of anesthesia    pt sts "they told me it took longer for them to wake me up than it should have".   Diabetes mellitus without complication ([place])    Family history of coronary artery disease 12/24/2011   GERD (gastroesophageal reflux disease)    Hypertension    Tobacco abuse     Patient Active Problem List   Diagnosis Date Noted   AKI (acute kidney injury) ([place]) 12/25/2016   Dehydration 12/25/2016   Cramp in muscle 12/25/2016   Hx of colonic polyps    Barrett's esophagus 02/14/2015   History of adenomatous polyp of colon 02/14/2015   Plantar fasciitis 06/23/2014   Bloody stool 12/28/2013   GERD (gastroesophageal reflux disease) 12/28/2013   Ankle sprain 02/08/2012   Cellulitis of foot 12/24/2011   Tinea pedis 12/24/2011   Tobacco abuse 12/24/2011   Family history of coronary artery disease 12/24/2011    Past Surgical History:  Procedure Laterality Date   COLONOSCOPY N/A 01/20/2014   RMR: Colonic polyps -removed as descrbied above. Friable anal canal hemorrhoids   COLONOSCOPY N/A 02/22/2015   Procedure: COLONOSCOPY;   Surgeon: Daneil Dolin, MD;  Location: [facility] END[facility];  Service: Endoscopy;  Laterality: N/A;  1400   ESOPHAGOGASTRODUODENOSCOPY N/A 01/20/2014   [id number] EG junction abnoraml distal esophagus- query  short segment Barretts -status post esophageal biospy. Hiatal hernia. Abnormal gastric mucosa-status post gastric biopsy.    ESOPHAGOGASTRODUODENOSCOPY N/A 02/22/2015   Procedure: ESOPHAGOGASTRODUODENOSCOPY (EGD);  Surgeon: Daneil Dolin, MD;  Location: [facility] END[facility];  Service: Endoscopy;  Laterality: N/A;   HERNIA REPAIR     Umbilical   INCISIONAL HERNIA REPAIR N/A 12/17/2013   Procedure: LAPAROSCOPIC INCISIONAL HERNIA REPAIR WITH MESH;  Surgeon: Jamesetta So, MD;  Location: [facility] ORS;  Service: General;  Laterality: N/A;   INSERTION OF MESH N/A 12/17/2013   Procedure: INSERTION OF MESH;  Surgeon: Jamesetta So, MD;  Location: [facility] ORS;  Service: General;  Laterality: N/A;   MULTIPLE EXTRACTIONS WITH ALVEOLOPLASTY Bilateral 12/20/2016   Procedure: MULTIPLE EXTRACTION OF TEETH NUMBER ONE, THREE, SIX, SEVEN, EIGHT, NINE, TEN, ELEVEN, FOURTEEN, NINETEEN, TWENTY, TWENTY-ONE, TWENTY-TWO, TWENTY-THREE, TWENTY-FOUR, TWENTY- FIVE, TWENTY-SIX, TWENTY-SEVEN, TWENTY-EIGHT, TWENTY-NINE, THIRTY  ;   ALVEOLOPLASTY; BIOPSY LESION OF UVULA;  Surgeon: Diona Browner, DDS;  Location: [facility];  Service: Oral Surgery;  Laterality: Bilateral;   MULTIPLE TOOTH EXTRACTIONS     teeth extracted     12 teeth removed in [place]       Family History  Problem Relation Age of Onset   Diabetes Mother  Hypertension Mother    Heart disease Mother    Lung disease Mother    Heart failure Father    Heart attack Father    Heart attack Brother    Hypertension Brother    Heart disease Brother    Heart disease Sister    Mental illness Sister    Seizures Sister    Heart attack Maternal Grandmother    Diabetes Maternal Grandmother    Pneumonia Maternal Grandfather    COPD Maternal Grandfather    Emphysema Maternal  Grandfather    Lung disease Maternal Grandfather    Heart disease Brother    Mental illness Brother    Colon cancer Neg Hx    Inflammatory bowel disease Neg Hx     Social History   Tobacco Use   Smoking status: Every Day    Packs/day: 1.50    Years: 30.00    Pack years: 45.00    Types: Cigarettes   Smokeless tobacco: Never  Vaping Use   Vaping Use: Some days   Last attempt to quit: 08/24/2017  Substance Use Topics   Alcohol use: No   Drug use: No    Home Medications Prior to Admission medications   Medication Sig Start Date End Date Taking? Authorizing Provider  nirmatrelvir/ritonavir EUA (PAXLOVID) 20 x 150 MG & 10 x '100MG'$  TABS Take 3 tablets by mouth 2 (two) times daily for 5 days. Patient GFR is 60. Take nirmatrelvir (150 mg) two tablets twice daily for 5 days and ritonavir (100 mg) one tablet twice daily for 5 days. 04/18/21 04/23/21 Yes Ripley Fraise, MD  atorvastatin (LIPITOR) 20 MG tablet Take 1 tablet (20 mg total) by mouth daily. Patient not taking: Reported on 04/12/2021 03/20/20   Soyla Dryer, PA-C  lisinopril (ZESTRIL) 20 MG tablet Take 1 tablet (20 mg total) by mouth daily. 04/12/21   Soyla Dryer, PA-C  omeprazole (PRILOSEC) 20 MG capsule Take 2 capsules (40 mg total) by mouth daily. 12/08/19   Soyla Dryer, PA-C    Allergies    No known allergies  Review of Systems   Review of Systems  Constitutional:  Positive for fatigue. Negative for fever.  Respiratory:  Positive for cough and shortness of breath. Negative for hemoptysis.   Cardiovascular:  Negative for chest pain and leg swelling.  Musculoskeletal:  Positive for myalgias.  All other systems reviewed and are negative.  Physical Exam Updated Vital Signs BP 122/72   Pulse 97   Temp 98 F (36.7 C) (Oral)   Resp (!) 22   Ht 1.753 m ('5\' 9"'$ )   Wt 122.5 kg   SpO2 94%   BMI 39.87 kg/m   Physical Exam CONSTITUTIONAL: Well developed/well nourished HEAD: Normocephalic/atraumatic EYES:  EOMI/PERRL ENMT: Mucous membranes moist NECK: supple no meningeal signs SPINE/BACK:entire spine nontender CV: S1/S2 noted, no murmurs/rubs/gallops noted LUNGS: Decreased breath sounds bilaterally, otherwise no acute distress ABDOMEN: soft, nontender, no rebound or guarding, bowel sounds noted throughout abdomen GU:no cva tenderness NEURO: Pt is awake/alert/appropriate, moves all extremitiesx4.  No facial droop.   EXTREMITIES: pulses normal/equal, full ROM SKIN: warm, color normal PSYCH: no abnormalities of mood noted, alert and oriented to situation  ED Results / Procedures / Treatments   Labs (all labs ordered are listed, but only abnormal results are displayed) Labs Reviewed  RESP PANEL BY RT-PCR (FLU A&B, COVID) ARPGX2 - Abnormal; Notable for the following components:      Result Value   SARS Coronavirus 2 by RT PCR POSITIVE (*)  All other components within normal limits  COMPREHENSIVE METABOLIC PANEL - Abnormal; Notable for the following components:   Sodium 134 (*)    Chloride 97 (*)    Glucose, Bld 167 (*)    All other components within normal limits  CBC WITH DIFFERENTIAL/PLATELET    EKG EKG Interpretation  Date/Time:  Wednesday April 18 2021 02:24:40 EDT Ventricular Rate:  102 PR Interval:  129 QRS Duration: 85 QT Interval:  317 QTC Calculation: 413 R Axis:   70 Text Interpretation: Sinus tachycardia Confirmed by Ripley Fraise [phone]) on 04/18/2021 2:48:43 AM  Radiology DG Chest Port 1 View  Result Date: 04/18/2021 CLINICAL DATA:  Pain, shortness of breath, cough EXAM: PORTABLE CHEST 1 VIEW COMPARISON:  None. FINDINGS: Lungs are clear.  No pleural effusion or pneumothorax. The heart is normal in size. IMPRESSION: No evidence of acute cardiopulmonary disease. Electronically Signed   By: Julian Hy M.D.   On: 04/18/2021 00:44    Procedures Procedures   Medications Ordered in ED Medications  acetaminophen (TYLENOL) tablet 650 mg (650 mg Oral  Given 04/18/21 0332)  albuterol (VENTOLIN HFA) 108 (90 Base) MCG/ACT inhaler 2 puff (2 puffs Inhalation Given 04/18/21 [id number])    ED Course  I have reviewed the triage vital signs and the nursing notes.  Pertinent labs & imaging results that were available during my care of the patient were reviewed by me and considered in my medical decision making (see chart for details).    MDM Rules/Calculators/A&P                           Patient presented with cough noted to have COVID-19.  He did have mild hypoxia which seemed to improve with albuterol.  Patient is a smoker.  [name] not vaccinated.  Patient overall feels improved.  He ambulated in the room without difficulty.  Pulse ox at rest was around 94%.  He feels comfortable for discharge home.  However he has multiple comorbidities and would benefit from Paxlovid We discussed strict return precautions Final Clinical Impression(s) / ED Diagnoses Final diagnoses:  COVID-19    Rx / DC Orders ED Discharge Orders          Ordered    nirmatrelvir/ritonavir EUA (PAXLOVID) 20 x 150 MG & 10 x '100MG'$  TABS  2 times daily        04/18/21 0601             Ripley Fraise, MD 04/18/21 [phone]

## 2021-04-18 NOTE — ED Notes (Signed)
Room Air trial.

## 2021-04-26 ENCOUNTER — Encounter: Payer: Self-pay | Admitting: Physician Assistant

## 2021-04-26 ENCOUNTER — Ambulatory Visit: Payer: Medicaid Other | Admitting: Physician Assistant

## 2021-04-26 VITALS — BP 127/78

## 2021-04-26 DIAGNOSIS — E669 Obesity, unspecified: Secondary | ICD-10-CM

## 2021-04-26 DIAGNOSIS — I10 Essential (primary) hypertension: Secondary | ICD-10-CM

## 2021-04-26 DIAGNOSIS — E785 Hyperlipidemia, unspecified: Secondary | ICD-10-CM

## 2021-04-26 DIAGNOSIS — F17200 Nicotine dependence, unspecified, uncomplicated: Secondary | ICD-10-CM

## 2021-04-26 DIAGNOSIS — E119 Type 2 diabetes mellitus without complications: Secondary | ICD-10-CM

## 2021-04-26 MED ORDER — LISINOPRIL 20 MG PO TABS: 20.0000 mg | ORAL_TABLET | Freq: Every day | ORAL | 1 refills | Status: DC

## 2021-04-26 MED ORDER — METFORMIN HCL 500 MG PO TABS: 500.0000 mg | ORAL_TABLET | Freq: Two times a day (BID) | ORAL | 1 refills | Status: DC

## 2021-04-26 MED ORDER — ATORVASTATIN CALCIUM 20 MG PO TABS: 20.0000 mg | ORAL_TABLET | Freq: Every day | ORAL | 1 refills | Status: DC

## 2021-04-26 NOTE — Progress Notes (Signed)
   BP 127/78    Subjective:    Patient ID: Douglas Dorsey, male    DOB: Jul 31, 1976, 45 y.o.   MRN: [id number]  HPI: Douglas Dorsey is a 45 y.o. male presenting on 04/26/2021 for No chief complaint on file.   HPI  This is a telemedicine appointment through [employer]  I connected with  [name] on 04/26/21 by a video enabled telemedicine application and verified that I am speaking with the correct person using two identifiers.   I discussed the limitations of evaluation and management by telemedicine. The patient expressed understanding and agreed to proceed.  Pt is at work.  Provider is at office.   pt is 50yoM with appointment to follow up HTN and to review labs; he had appointment on 04/12/21 to establish care.   Pt was diagnosed in ER on 9/13 with covid  Pt is Working at [employer] through a [employer].    Pt has been monitoring his Bp which has been good- 127/78 this am  He says he is feeling well today.    Relevant past medical, surgical, family and social history reviewed and updated as indicated. Interim medical history since our last visit reviewed. Allergies and medications reviewed and updated.   Current Outpatient Medications:    lisinopril (ZESTRIL) 20 MG tablet, Take 1 tablet (20 mg total) by mouth daily., Disp: 30 tablet, Rfl: 1   omeprazole (PRILOSEC) 20 MG capsule, Take 2 capsules (40 mg total) by mouth daily., Disp: 90 capsule, Rfl: 0   atorvastatin (LIPITOR) 20 MG tablet, Take 1 tablet (20 mg total) by mouth daily. (Patient not taking: No sig reported), Disp: 30 tablet, Rfl: 0     Review of Systems  Per HPI unless specifically indicated above     Objective:    BP 127/78   Wt Readings from Last 3 Encounters:  04/17/21 270 lb (122.5 kg)  04/12/21 270 lb (122.5 kg)  11/20/20 249 lb (112.9 kg)    Physical Exam Vitals reviewed.  Constitutional:      General: [name] not in acute distress.    Appearance: [name] not toxic-appearing.  HENT:     Head:  Normocephalic and atraumatic.  Pulmonary:     Effort: No respiratory distress.     Comments: Pt is talking in complete sentences without dyspnea Neurological:     Mental Status: [name] alert and oriented to person, place, and time.  Psychiatric:        Attention and Perception: Attention normal.        Speech: Speech normal.          Assessment & Plan:    Encounter Diagnoses  Name Primary?   Essential hypertension Yes   Hyperlipidemia, unspecified hyperlipidemia type    Diabetes mellitus without complication (HCC)    Tobacco use disorder    Obesity, unspecified classification, unspecified obesity type, unspecified whether serious comorbidity present       -reviewed labs with pt  -will re-start Atorvastatin for lipids.  Pt is counseled to follow a lowfat diet -pt is counseled on diagnosis of DM.  Discussed nutrition and diabetic diet.   Discussed his recent weight gain as possible cause for his going from pre-diabetes to diabetes.  Pt will be started on Metformin.  He will be mailed more information on diabetes and diabetic diet -pt to continue Lisinopril for htn -pt to follow up 3 months.  [name] to contact office sooner prn

## 2021-04-26 NOTE — Patient Instructions (Signed)
Diabetes Mellitus and Nutrition, Adult When you have diabetes, or diabetes mellitus, it is very important to have healthy eating habits because your blood sugar (glucose) levels are greatly affected by what you eat and drink. Eating healthy foods in the right amounts, at about the same times every day, can help you:  Control your blood glucose.  Lower your risk of heart disease.  Improve your blood pressure.  Reach or maintain a healthy weight. What can affect my meal plan? Every person with diabetes is different, and each person has different needs for a meal plan. Your health care provider may recommend that you work with a dietitian to make a meal plan that is best for you. Your meal plan may vary depending on factors such as:  The calories you need.  The medicines you take.  Your weight.  Your blood glucose, blood pressure, and cholesterol levels.  Your activity level.  Other health conditions you have, such as heart or kidney disease. How do carbohydrates affect me? Carbohydrates, also called carbs, affect your blood glucose level more than any other type of food. Eating carbs naturally raises the amount of glucose in your blood. Carb counting is a method for keeping track of how many carbs you eat. Counting carbs is important to keep your blood glucose at a healthy level, especially if you use insulin or take certain oral diabetes medicines. It is important to know how many carbs you can safely have in each meal. This is different for every person. Your dietitian can help you calculate how many carbs you should have at each meal and for each snack. How does alcohol affect me? Alcohol can cause a sudden decrease in blood glucose (hypoglycemia), especially if you use insulin or take certain oral diabetes medicines. Hypoglycemia can be a life-threatening condition. Symptoms of hypoglycemia, such as sleepiness, dizziness, and confusion, are similar to symptoms of having too much  alcohol.  Do not drink alcohol if: ? Your health care provider tells you not to drink. ? You are pregnant, may be pregnant, or are planning to become pregnant.  If you drink alcohol: ? Do not drink on an empty stomach. ? Limit how much you use to:  0-1 drink a day for women.  0-2 drinks a day for men. ? Be aware of how much alcohol is in your drink. In the [place] one drink equals one 12 oz bottle of beer (355 mL), one 5 oz glass of wine (148 mL), or one 1 oz glass of hard liquor (44 mL). ? Keep yourself hydrated with water, diet soda, or unsweetened iced tea.  Keep in mind that regular soda, juice, and other mixers may contain a lot of sugar and must be counted as carbs. What are tips for following this plan? Reading food labels  Start by checking the serving size on the "Nutrition Facts" label of packaged foods and drinks. The amount of calories, carbs, fats, and other nutrients listed on the label is based on one serving of the item. Many items contain more than one serving per package.  Check the total grams (g) of carbs in one serving. You can calculate the number of servings of carbs in one serving by dividing the total carbs by 15. For example, if a food has 30 g of total carbs per serving, it would be equal to 2 servings of carbs.  Check the number of grams (g) of saturated fats and trans fats in one serving. Choose foods that have   a low amount or none of these fats.  Check the number of milligrams (mg) of salt (sodium) in one serving. Most people should limit total sodium intake to less than 2,300 mg per day.  Always check the nutrition information of foods labeled as "low-fat" or "nonfat." These foods may be higher in added sugar or refined carbs and should be avoided.  Talk to your dietitian to identify your daily goals for nutrients listed on the label. Shopping  Avoid buying canned, pre-made, or processed foods. These foods tend to be high in fat, sodium, and added  sugar.  Shop around the outside edge of the grocery store. This is where you will most often find fresh fruits and vegetables, bulk grains, fresh meats, and fresh dairy. Cooking  Use low-heat cooking methods, such as baking, instead of high-heat cooking methods like deep frying.  Cook using healthy oils, such as olive, canola, or sunflower oil.  Avoid cooking with butter, cream, or high-fat meats. Meal planning  Eat meals and snacks regularly, preferably at the same times every day. Avoid going long periods of time without eating.  Eat foods that are high in fiber, such as fresh fruits, vegetables, beans, and whole grains. Talk with your dietitian about how many servings of carbs you can eat at each meal.  Eat 4-6 oz (112-168 g) of lean protein each day, such as lean meat, chicken, fish, eggs, or tofu. One ounce (oz) of lean protein is equal to: ? 1 oz (28 g) of meat, chicken, or fish. ? 1 egg. ?  cup (62 g) of tofu.  Eat some foods each day that contain healthy fats, such as avocado, nuts, seeds, and fish.   What foods should I eat? Fruits Berries. Apples. Oranges. Peaches. Apricots. Plums. Grapes. Mango. Papaya. Pomegranate. Kiwi. Cherries. Vegetables Lettuce. Spinach. Leafy greens, including kale, chard, collard greens, and mustard greens. Beets. Cauliflower. Cabbage. Broccoli. Carrots. Green beans. Tomatoes. Peppers. Onions. Cucumbers. Brussels sprouts. Grains Whole grains, such as whole-wheat or whole-grain bread, crackers, tortillas, cereal, and pasta. Unsweetened oatmeal. Quinoa. Brown or wild rice. Meats and other proteins Seafood. Poultry without skin. Lean cuts of poultry and beef. Tofu. Nuts. Seeds. Dairy Low-fat or fat-free dairy products such as milk, yogurt, and cheese. The items listed above may not be a complete list of foods and beverages you can eat. Contact a dietitian for more information. What foods should I avoid? Fruits Fruits canned with  syrup. Vegetables Canned vegetables. Frozen vegetables with butter or cream sauce. Grains Refined white flour and flour products such as bread, pasta, snack foods, and cereals. Avoid all processed foods. Meats and other proteins Fatty cuts of meat. Poultry with skin. Breaded or fried meats. Processed meat. Avoid saturated fats. Dairy Full-fat yogurt, cheese, or milk. Beverages Sweetened drinks, such as soda or iced tea. The items listed above may not be a complete list of foods and beverages you should avoid. Contact a dietitian for more information. Questions to ask a health care provider  Do I need to meet with a diabetes educator?  Do I need to meet with a dietitian?  What number can I call if I have questions?  When are the best times to check my blood glucose? Where to find more information:  American Diabetes Association: diabetes.org  Academy of Nutrition and Dietetics: www.eatright.org  National Institute of Diabetes and Digestive and Kidney Diseases: www.niddk.nih.gov  Association of Diabetes Care and Education Specialists: www.diabeteseducator.org Summary  It is important to have healthy eating   habits because your blood sugar (glucose) levels are greatly affected by what you eat and drink.  A healthy meal plan will help you control your blood glucose and maintain a healthy lifestyle.  Your health care provider may recommend that you work with a dietitian to make a meal plan that is best for you.  Keep in mind that carbohydrates (carbs) and alcohol have immediate effects on your blood glucose levels. It is important to count carbs and to use alcohol carefully. This information is not intended to replace advice given to you by your health care provider. Make sure you discuss any questions you have with your health care provider. Document Revised: 06/29/2019 Document Reviewed: 06/29/2019 Elsevier Patient Education  2021 [employer]vier Inc.  

## 2021-07-24 ENCOUNTER — Other Ambulatory Visit: Payer: Self-pay | Admitting: Physician Assistant

## 2021-07-24 DIAGNOSIS — E119 Type 2 diabetes mellitus without complications: Secondary | ICD-10-CM

## 2021-07-24 DIAGNOSIS — I10 Essential (primary) hypertension: Secondary | ICD-10-CM

## 2021-07-24 DIAGNOSIS — E785 Hyperlipidemia, unspecified: Secondary | ICD-10-CM

## 2021-08-08 ENCOUNTER — Ambulatory Visit: Payer: Medicaid Other | Admitting: Physician Assistant

## 2021-08-21 ENCOUNTER — Encounter: Payer: Self-pay | Admitting: Gastroenterology

## 2021-08-21 NOTE — Progress Notes (Signed)
Referring Provider:  Soyla Dryer, PA-C Primary Care Physician:  Soyla Dryer, PA-C Primary Gastroenterologist:  Dr. Gala Romney  Chief Complaint  Patient presents with   Hemorrhoids    Bleeding, tries not to strain with BM   barretts esophagus    Takes omep when he can afford it. Been out x 1-2 weeks now    HPI:   Douglas Dorsey is a 46 y.o. male presenting today at the request of  Soyla Dryer, PA-C due to history of Barrett's esophagus, overdue for surveillance EGD.  Last EGD in 2016 with Barrett's esophagus without dysplasia, recommended repeat in 3 years. We have not seen patient since 2016.   Today:  Reflux is well controlled when he is taking omeprazole 20 mg daily, horrible if he sips days.  [name] currently buying omeprazole over-the-counter and taking 2 tablets daily.  Sometimes, he is not able to afford the medication.  No dysphagia, nausea, vomiting, or abdominal pain.  No trouble with constipation or diarrhea.   Having bleeding with almost every BM.  Bright red blood on toilet tissue, and stool, and in toilet water.  He has known hemorrhoids and reports they are always on the inside, never prolapsing.  States his rectal bleeding never stopped after his colonoscopies in 2015, but has just picked up in frequency.  He has had bleeding with every bowel movement for the last couple of years.  Denies rectal pain and does not use any rectal creams.  Reports he used Anusol cream years ago prescribed by Dr. Gala Romney which was helpful.  Stools are [facility] 3 and do not require straining.  Denies rectal pain.  Hemoglobin 15.2 on 04/18/2021.  Past Medical History:  Diagnosis Date   Barrett's esophagus 2016   Colon polyp 0998   Complication of anesthesia    pt sts "they told me it took longer for them to wake me up than it should have".   Diabetes mellitus without complication ([place])    Family history of coronary artery disease 12/24/2011   GERD (gastroesophageal reflux disease)     Hypertension    Tobacco abuse     Past Surgical History:  Procedure Laterality Date   COLONOSCOPY N/A 01/20/2014   RMR: Colonic polyps -removed as descrbied above. Friable anal canal hemorrhoids.  Pathology with tubular adenoma.   COLONOSCOPY N/A 02/22/2015   Surgeon: Daneil Dolin, MD; normal exam.  Repeat in 10 years.   ESOPHAGOGASTRODUODENOSCOPY N/A 01/20/2014   PJA:[id number] EG junction abnoraml distal esophagus- query  short segment Barretts -status post esophageal biospy. Hiatal hernia. Abnormal gastric mucosa-status post gastric biopsy.  Pathology with chronic gastritis, negative for H. pylori, Barrett's esophagus without dysplasia.   ESOPHAGOGASTRODUODENOSCOPY N/A 02/22/2015   Surgeon: Daneil Dolin, MD; Barrett's esophagus without dysplasia, hiatal hernia.  Repeat in 3 years.   HERNIA REPAIR     Umbilical   INCISIONAL HERNIA REPAIR N/A 12/17/2013   Procedure: LAPAROSCOPIC INCISIONAL HERNIA REPAIR WITH MESH;  Surgeon: Jamesetta So, MD;  Location: [facility];  Service: General;  Laterality: N/A;   INSERTION OF MESH N/A 12/17/2013   Procedure: INSERTION OF MESH;  Surgeon: Jamesetta So, MD;  Location: [facility];  Service: General;  Laterality: N/A;   MULTIPLE EXTRACTIONS WITH ALVEOLOPLASTY Bilateral 12/20/2016   Procedure: MULTIPLE EXTRACTION OF TEETH NUMBER ONE, THREE, SIX, SEVEN, EIGHT, NINE, TEN, ELEVEN, FOURTEEN, NINETEEN, TWENTY, TWENTY-ONE, TWENTY-TWO, TWENTY-THREE, TWENTY-FOUR, TWENTY- FIVE, TWENTY-SIX, TWENTY-SEVEN, TWENTY-EIGHT, TWENTY-NINE, THIRTY  ;   ALVEOLOPLASTY; BIOPSY LESION OF UVULA;  Surgeon: Diona Browner, DDS;  Location: [place];  Service: Oral Surgery;  Laterality: Bilateral;   MULTIPLE TOOTH EXTRACTIONS     teeth extracted     12 teeth removed in [place]    Current Outpatient Medications  Medication Sig Dispense Refill   atorvastatin (LIPITOR) 20 MG tablet Take 1 tablet (20 mg total) by mouth daily. 90 tablet 1   hydrocortisone (ANUSOL-HC) 2.5 % rectal cream  Place 1 application rectally 2 (two) times daily. 30 g 1   lisinopril (ZESTRIL) 20 MG tablet Take 1 tablet (20 mg total) by mouth daily. 90 tablet 1   metFORMIN (GLUCOPHAGE) 500 MG tablet Take 1 tablet (500 mg total) by mouth 2 (two) times daily with a meal. 180 tablet 1   omeprazole (PRILOSEC) 40 MG capsule Take 1 capsule (40 mg total) by mouth daily. 90 capsule 3   No current facility-administered medications for this visit.    Allergies as of 08/22/2021 - Review Complete 08/22/2021  Allergen Reaction Noted   No known allergies  12/19/2016    Family History  Problem Relation Age of Onset   Diabetes Mother    Hypertension Mother    Heart disease Mother    Lung disease Mother    Heart failure Father    Heart attack Father    Heart disease Sister    Mental illness Sister    Seizures Sister    Heart attack Brother    Hypertension Brother    Heart disease Brother    Heart disease Brother    Mental illness Brother    Heart attack Maternal Grandmother    Diabetes Maternal Grandmother    Pneumonia Maternal Grandfather    COPD Maternal Grandfather    Emphysema Maternal Grandfather    Lung disease Maternal Grandfather    Colon cancer Neg Hx    Inflammatory bowel disease Neg Hx    Cancer - Colon Neg Hx     Social History   Socioeconomic History   Marital status: Married    Spouse name: Not on file   Number of children: Not on file   Years of education: Not on file   Highest education level: Not on file  Occupational History   Not on file  Tobacco Use   Smoking status: Every Day    Packs/day: 1.50    Years: 30.00    Pack years: 45.00    Types: Cigarettes   Smokeless tobacco: Never  Vaping Use   Vaping Use: Some days   Last attempt to quit: 08/24/2017  Substance and Sexual Activity   Alcohol use: No   Drug use: No   Sexual activity: Yes    Birth control/protection: None  Other Topics Concern   Not on file  Social History Narrative   Not on file   Social  Determinants of Health   Financial Resource Strain: Not on file  Food Insecurity: Not on file  Transportation Needs: Not on file  Physical Activity: Not on file  Stress: Not on file  Social Connections: Not on file  Intimate Partner Violence: Not on file    Review of Systems: Gen: Denies any fever, chills, cold or flulike symptoms, presyncope, syncope. CV: Denies chest pain, heart palpitations.  Resp: Denies shortness of breath or cough. GI: See HPI GU : Denies urinary burning, urinary frequency, urinary hesitancy MS: Denies joint pain. Derm: Denies rash. Psych: Denies depression, anxiety. Heme: See HPI  Physical Exam: BP (!) 153/91    Pulse 99  Temp (!) 97.5 F (36.4 C)    Ht 5\' 7"  (1.702 m)    Wt 263 lb 9.6 oz (119.6 kg)    BMI 41.29 kg/m  General:   Alert and oriented. Pleasant and cooperative. Well-nourished and well-developed.  Head:  Normocephalic and atraumatic. Eyes:  Without icterus, sclera clear and conjunctiva pink.  Ears:  Normal auditory acuity. Lungs:  Clear to auscultation bilaterally. No wheezes, rales, or rhonchi. No distress.  Heart:  S1, S2 present without murmurs appreciated.  Abdomen:  +BS, soft, non-tender and non-distended. No HSM noted. No guarding or rebound. Vertical midline scar above the umbilicus from prior hernia surgery, just to the left of this, there is bulge about as big around as a baseball that is soft and non-tender. Patient reports this is chronic and unchanged. Previously saw Dr. Arnoldo Morale a few years ago and was told it was fatty tissue.  (Reviewed CT from 2017 with findings of a small ventral hernia containing fat along left lateral margin of surgical mesh).  Rectal:  Deferred  Msk:  Symmetrical without gross deformities. Normal posture. Extremities:  Without edema. Neurologic:  Alert and  oriented x4;  grossly normal neurologically. Skin:  Intact without significant lesions or rashes. Psych: Normal mood and affect.     Assessment: 46 year old male with history of GERD, Barrett's esophagus, rectal bleeding in the setting of known hemorrhoids, and adenomatous colon polyp in 2015, presenting today to discuss scheduling surveillance EGD.  Also discussed GERD and rectal bleeding.  Barrett's esophagus: Overdue for surveillance.  Last EGD in 2016 consistent with Barrett's esophagus without dysplasia.  He was due for surveillance in 2019.  Tries to remain on PPI daily when he can afford it.  No dysphagia.  GERD: Well-controlled on omeprazole 40 mg daily when he can afford it.  When he misses days, reflux returns.  Currently buying omeprazole over-the-counter which is expensive for him.  Rectal bleeding: Chronic.  Likely secondary to known internal hemorrhoids.  Symptoms have been present since before his first colonoscopy in 2015.  At that time, he was found to have friable anal canal hemorrhoids and a tubular adenoma.  Repeat colonoscopy in 2016 with a normal exam.  Recommendations at that time were to repeat in 10 years.  Reports rectal bleeding never stopped, but has increased in frequency, having rectal bleeding with almost every bowel movement for the last 2 years.  No unintentional weight loss.  No rectal pain or constipation.  Not using any rectal creams though he does report Anusol cream did previously help his symptoms.  Notably.  Most recent hemoglobin in September 2022 was 15.2.  We will treat his symptoms with Anusol rectal cream, but if ongoing rectal bleeding, would consider updating a colonoscopy to rule out other etiologies, and then possibly a hemorrhoid banding.   Plan: EGD with propofol with Dr. Abbey Chatters in the near future (in Dr. Roseanne Kaufman place as he will be out of the office for an extended time). The risks, benefits, and alternatives have been discussed with the patient in detail. The patient states understanding and desires to proceed. ASA 3 Hold metformin morning of procedure. Take omeprazole 40  mg daily 30 minutes before breakfast.  Discussed the importance of taking this medication every day.  New prescription was sent to [facility] in hopes to help with cost. Start Anusol rectal cream twice daily x7-10 days.  Requested he let me know of any ongoing symptoms. Consider repeat colonoscopy if persistent rectal bleeding to rule out other  causes. Start Benefiber 2 teaspoons daily x2 weeks then increase to twice daily. Follow-up after EGD.  Advised to call with any questions or concerns prior.   Aliene Altes, PA-C [facility] 08/22/2021

## 2021-08-22 ENCOUNTER — Other Ambulatory Visit: Payer: Self-pay

## 2021-08-22 ENCOUNTER — Encounter: Payer: Self-pay | Admitting: Gastroenterology

## 2021-08-22 ENCOUNTER — Ambulatory Visit (INDEPENDENT_AMBULATORY_CARE_PROVIDER_SITE_OTHER): Payer: Self-pay | Admitting: Gastroenterology

## 2021-08-22 VITALS — BP 153/91 | HR 99 | Temp 97.5°F | Ht 67.0 in | Wt 263.6 lb

## 2021-08-22 DIAGNOSIS — K2270 Barrett's esophagus without dysplasia: Secondary | ICD-10-CM

## 2021-08-22 DIAGNOSIS — K625 Hemorrhage of anus and rectum: Secondary | ICD-10-CM

## 2021-08-22 DIAGNOSIS — K219 Gastro-esophageal reflux disease without esophagitis: Secondary | ICD-10-CM

## 2021-08-22 DIAGNOSIS — K649 Unspecified hemorrhoids: Secondary | ICD-10-CM | POA: Insufficient documentation

## 2021-08-22 MED ORDER — OMEPRAZOLE 40 MG PO CPDR: 40.0000 mg | DELAYED_RELEASE_CAPSULE | Freq: Every day | ORAL | 3 refills | Status: DC

## 2021-08-22 MED ORDER — HYDROCORTISONE (PERIANAL) 2.5 % EX CREA
1.0000 | TOPICAL_CREAM | Freq: Two times a day (BID) | CUTANEOUS | 1 refills | Status: DC
Start: 2021-08-22 — End: 2021-10-01

## 2021-08-22 NOTE — Patient Instructions (Signed)
We will arrange for you to have an upper endoscopy in the near future with Dr. Abbey Chatters.  Take omeprazole 40 mg daily 30 minutes before breakfast.  As we discussed, it is important that you take this medication every day due to your history of Barrett's esophagus to prevent progression of this condition.  I am sending a prescription for omeprazole to [facility] in hopes that this will be more affordable for you.  I suspect your rectal bleeding is likely secondary to known hemorrhoids.  Start Anusol rectal cream twice daily for the next 7-10 days.  I am sending a prescription to [facility]. Please let me know if this does not improve your rectal bleeding.  Please also start Benefiber 2 teaspoons daily x2 weeks then increase to twice daily to help with stool consistency.  We will plan to follow-up with you in the office after your upper endoscopy.  Do not hesitate to call if you have any questions or concerns prior to next visit.  Aliene Altes, PA-C [facility]

## 2021-09-26 NOTE — Patient Instructions (Signed)
Douglas Dorsey  09/26/2021     @PREFPERIOPPHARMACY @   Your procedure is scheduled on 10/01/2021.   Report to [facility] at  1145  A.M.   Call this number if you have problems the morning of surgery:  [phone]   Remember:  Follow the diet instructions given to you by the office.    Use your inhaler before you come and bring your rescue inhaler with you.     DO NOT take any medications for diabetes the morning of your procedure.     Take these medicines the morning of surgery with A SIP OF WATER                                   prilosec.     Do not wear jewelry, make-up or nail polish.  Do not wear lotions, powders, or perfumes, or deodorant.  Do not shave 48 hours prior to surgery.  Men may shave face and neck.  Do not bring valuables to the hospital.  [facility] is not responsible for any belongings or valuables.  Contacts, dentures or bridgework may not be worn into surgery.  Leave your suitcase in the car.  After surgery it may be brought to your room.  For patients admitted to the hospital, discharge time will be determined by your treatment team.  Patients discharged the day of surgery will not be allowed to drive home and must have someone with them for 24 hours.    Special instructions:   DO NOT smoke tobacco or vape for 24 hours before your procedure.  Please read over the following fact sheets that you were given. Anesthesia Post-op Instructions and Care and Recovery After Surgery      Upper Endoscopy, Adult, Care After This sheet gives you information about how to care for yourself after your procedure. Your health care provider may also give you more specific instructions. If you have problems or questions, contact your health care provider. What can I expect after the procedure? After the procedure, it is common to have: A sore throat. Mild stomach pain or discomfort. Bloating. Nausea. Follow these instructions at home:  Follow  instructions from your health care provider about what to eat or drink after your procedure. Return to your normal activities as told by your health care provider. Ask your health care provider what activities are safe for you. Take over-the-counter and prescription medicines only as told by your health care provider. If you were given a sedative during the procedure, it can affect you for several hours. Do not drive or operate machinery until your health care provider says that it is safe. Keep all follow-up visits as told by your health care provider. This is important. Contact a health care provider if you have: A sore throat that lasts longer than one day. Trouble swallowing. Get help right away if: You vomit blood or your vomit looks like coffee grounds. You have: A fever. Bloody, black, or tarry stools. A severe sore throat or you cannot swallow. Difficulty breathing. Severe pain in your chest or abdomen. Summary After the procedure, it is common to have a sore throat, mild stomach discomfort, bloating, and nausea. If you were given a sedative during the procedure, it can affect you for several hours. Do not drive or operate machinery until your health care provider says that it is safe. Follow instructions from  your health care provider about what to eat or drink after your procedure. Return to your normal activities as told by your health care provider. This information is not intended to replace advice given to you by your health care provider. Make sure you discuss any questions you have with your health care provider. Document Revised: 05/28/2019 Document Reviewed: 12/22/2017 Elsevier Patient Education  2022 [employer] After This sheet gives you information about how to care for yourself after your procedure. Your health care provider may also give you more specific instructions. If you have problems or questions, contact your health care  provider. What can I expect after the procedure? After the procedure, it is common to have: Tiredness. Forgetfulness about what happened after the procedure. Impaired judgment for important decisions. Nausea or vomiting. Some difficulty with balance. Follow these instructions at home: For the time period you were told by your health care provider:   Rest as needed. Do not participate in activities where you could fall or become injured. Do not drive or use machinery. Do not drink alcohol. Do not take sleeping pills or medicines that cause drowsiness. Do not make important decisions or sign legal documents. Do not take care of children on your own. Eating and drinking Follow the diet that is recommended by your health care provider. Drink enough fluid to keep your urine pale yellow. If you vomit: Drink water, juice, or soup when you can drink without vomiting. Make sure you have little or no nausea before eating solid foods. General instructions Have a responsible adult stay with you for the time you are told. It is important to have someone help care for you until you are awake and alert. Take over-the-counter and prescription medicines only as told by your health care provider. If you have sleep apnea, surgery and certain medicines can increase your risk for breathing problems. Follow instructions from your health care provider about wearing your sleep device: Anytime you are sleeping, including during daytime naps. While taking prescription pain medicines, sleeping medicines, or medicines that make you drowsy. Avoid smoking. Keep all follow-up visits as told by your health care provider. This is important. Contact a health care provider if: You keep feeling nauseous or you keep vomiting. You feel light-headed. You are still sleepy or having trouble with balance after 24 hours. You develop a rash. You have a fever. You have redness or swelling around the IV site. Get help  right away if: You have trouble breathing. You have new-onset confusion at home. Summary For several hours after your procedure, you may feel tired. You may also be forgetful and have poor judgment. Have a responsible adult stay with you for the time you are told. It is important to have someone help care for you until you are awake and alert. Rest as told. Do not drive or operate machinery. Do not drink alcohol or take sleeping pills. Get help right away if you have trouble breathing, or if you suddenly become confused. This information is not intended to replace advice given to you by your health care provider. Make sure you discuss any questions you have with your health care provider. Document Revised: 04/06/2020 Document Reviewed: 06/24/2019 Elsevier Patient Education  2022 [employer].

## 2021-09-27 ENCOUNTER — Encounter (HOSPITAL_COMMUNITY)
Admission: RE | Admit: 2021-09-27 | Discharge: 2021-09-27 | Disposition: A | Discharge status: Home / Self Care | Payer: Medicaid Other | Source: Ambulatory Visit | Attending: Internal Medicine | Admitting: Internal Medicine

## 2021-09-27 VITALS — BP 159/98 | HR 91 | Temp 97.7°F | Resp 18 | Ht 69.0 in | Wt 265.0 lb

## 2021-09-27 DIAGNOSIS — Z01812 Encounter for preprocedural laboratory examination: Secondary | ICD-10-CM | POA: Insufficient documentation

## 2021-09-27 DIAGNOSIS — E119 Type 2 diabetes mellitus without complications: Secondary | ICD-10-CM | POA: Insufficient documentation

## 2021-09-27 LAB — BASIC METABOLIC PANEL
Anion gap: 7 (ref 5–15)
BUN: 16 mg/dL (ref 6–20)
CO2: 28 mmol/L (ref 22–32)
Calcium: 9 mg/dL (ref 8.9–10.3)
Chloride: 99 mmol/L (ref 98–111)
Creatinine, Ser: 0.82 mg/dL (ref 0.61–1.24)
GFR, Estimated: >60 mL/min (ref >60)
Glucose, Bld: 254 mg/dL — ABNORMAL HIGH (ref 70–99)
Potassium: 4.3 mmol/L (ref 3.5–5.1)
Sodium: 134 mmol/L — ABNORMAL LOW (ref 135–145)

## 2021-10-01 ENCOUNTER — Ambulatory Visit (HOSPITAL_BASED_OUTPATIENT_CLINIC_OR_DEPARTMENT_OTHER): Payer: Self-pay | Admitting: Anesthesiology

## 2021-10-01 ENCOUNTER — Other Ambulatory Visit: Payer: Self-pay | Admitting: Gastroenterology

## 2021-10-01 ENCOUNTER — Encounter (HOSPITAL_COMMUNITY): Payer: Self-pay

## 2021-10-01 ENCOUNTER — Ambulatory Visit (HOSPITAL_COMMUNITY): Payer: Self-pay | Admitting: Anesthesiology

## 2021-10-01 ENCOUNTER — Telehealth: Payer: Self-pay | Admitting: Internal Medicine

## 2021-10-01 ENCOUNTER — Encounter (HOSPITAL_COMMUNITY)
Admission: RE | Disposition: A | Discharge status: Home / Self Care | Payer: Self-pay | Source: Ambulatory Visit | Attending: Internal Medicine

## 2021-10-01 ENCOUNTER — Ambulatory Visit (HOSPITAL_COMMUNITY)
Admission: RE | Admit: 2021-10-01 | Discharge: 2021-10-01 | Disposition: A | Discharge status: Home / Self Care | Payer: Self-pay | Source: Ambulatory Visit | Attending: Internal Medicine | Admitting: Internal Medicine

## 2021-10-01 DIAGNOSIS — F17210 Nicotine dependence, cigarettes, uncomplicated: Secondary | ICD-10-CM | POA: Insufficient documentation

## 2021-10-01 DIAGNOSIS — I10 Essential (primary) hypertension: Secondary | ICD-10-CM | POA: Insufficient documentation

## 2021-10-01 DIAGNOSIS — Z79899 Other long term (current) drug therapy: Secondary | ICD-10-CM | POA: Insufficient documentation

## 2021-10-01 DIAGNOSIS — K625 Hemorrhage of anus and rectum: Secondary | ICD-10-CM

## 2021-10-01 DIAGNOSIS — K2970 Gastritis, unspecified, without bleeding: Secondary | ICD-10-CM | POA: Insufficient documentation

## 2021-10-01 DIAGNOSIS — K449 Diaphragmatic hernia without obstruction or gangrene: Secondary | ICD-10-CM

## 2021-10-01 DIAGNOSIS — K2270 Barrett's esophagus without dysplasia: Secondary | ICD-10-CM | POA: Insufficient documentation

## 2021-10-01 DIAGNOSIS — K31A19 Gastric intestinal metaplasia without dysplasia, unspecified site: Secondary | ICD-10-CM | POA: Insufficient documentation

## 2021-10-01 DIAGNOSIS — K219 Gastro-esophageal reflux disease without esophagitis: Secondary | ICD-10-CM | POA: Insufficient documentation

## 2021-10-01 DIAGNOSIS — E119 Type 2 diabetes mellitus without complications: Secondary | ICD-10-CM | POA: Insufficient documentation

## 2021-10-01 DIAGNOSIS — Z7984 Long term (current) use of oral hypoglycemic drugs: Secondary | ICD-10-CM | POA: Insufficient documentation

## 2021-10-01 HISTORY — PX: ESOPHAGOGASTRODUODENOSCOPY (EGD) WITH PROPOFOL: SHX5813

## 2021-10-01 HISTORY — PX: BIOPSY: SHX5522

## 2021-10-01 LAB — GLUCOSE, CAPILLARY: Glucose-Capillary: 162 mg/dL — ABNORMAL HIGH (ref 70–99)

## 2021-10-01 SURGERY — ESOPHAGOGASTRODUODENOSCOPY (EGD) WITH PROPOFOL
Anesthesia: General

## 2021-10-01 MED ORDER — PROPOFOL 10 MG/ML IV BOLUS
INTRAVENOUS | Status: DC | PRN
  Administered 2021-10-01 (×2): 50 mg via INTRAVENOUS
  Administered 2021-10-01: 80 mg via INTRAVENOUS
  Administered 2021-10-01: 40 mg via INTRAVENOUS
  Administered 2021-10-01: 30 mg via INTRAVENOUS

## 2021-10-01 MED ORDER — HYDROCORTISONE (PERIANAL) 2.5 % EX CREA: 1.0000 "application " | TOPICAL_CREAM | Freq: Two times a day (BID) | CUTANEOUS | 1 refills | Status: DC

## 2021-10-01 MED ORDER — LIDOCAINE 2% (20 MG/ML) 5 ML SYRINGE
INTRAMUSCULAR | Status: DC | PRN
  Administered 2021-10-01: 50 mg via INTRAVENOUS

## 2021-10-01 MED ORDER — LACTATED RINGERS IV SOLN: INTRAVENOUS | Status: DC

## 2021-10-01 MED ORDER — LACTATED RINGERS IV SOLN: INTRAVENOUS | Status: DC | PRN

## 2021-10-01 NOTE — Anesthesia Postprocedure Evaluation (Signed)
Anesthesia Post Note  Patient: Douglas Dorsey  Procedure(s) Performed: ESOPHAGOGASTRODUODENOSCOPY (EGD) WITH PROPOFOL BIOPSY  Patient location during evaluation: Phase II Anesthesia Type: General Level of consciousness: awake and alert and oriented Pain management: pain level controlled Vital Signs Assessment: post-procedure vital signs reviewed and stable Respiratory status: spontaneous breathing, nonlabored ventilation and respiratory function stable Cardiovascular status: blood pressure returned to baseline and stable Postop Assessment: no apparent nausea or vomiting Anesthetic complications: no   No notable events documented.   Last Vitals:  Vitals:   10/01/21 1226 10/01/21 1332  BP: (!) 153/92 111/81  Pulse: 98   Resp: 14 20  Temp: 36.4 C 36.8 C  SpO2: 100% 97%    Last Pain:  Vitals:   10/01/21 1332  TempSrc: Oral  PainSc: 0-No pain                 Srinika Delone C Merlie Noga

## 2021-10-01 NOTE — Discharge Instructions (Addendum)
EGD Discharge instructions Please read the instructions outlined below and refer to this sheet in the next few weeks. These discharge instructions provide you with general information on caring for yourself after you leave the hospital. Your doctor may also give you specific instructions. While your treatment has been planned according to the most current medical practices available, unavoidable complications occasionally occur. If you have any problems or questions after discharge, please call your doctor. ACTIVITY You may resume your regular activity but move at a slower pace for the next 24 hours.  Take frequent rest periods for the next 24 hours.  Walking will help expel (get rid of) the air and reduce the bloated feeling in your abdomen.  No driving for 24 hours (because of the anesthesia (medicine) used during the test).  You may shower.  Do not sign any important legal documents or operate any machinery for 24 hours (because of the anesthesia used during the test).  NUTRITION Drink plenty of fluids.  You may resume your normal diet.  Begin with a light meal and progress to your normal diet.  Avoid alcoholic beverages for 24 hours or as instructed by your caregiver.  MEDICATIONS You may resume your normal medications unless your caregiver tells you otherwise.  WHAT YOU CAN EXPECT TODAY You may experience abdominal discomfort such as a feeling of fullness or gas pains.  FOLLOW-UP Your doctor will discuss the results of your test with you.  SEEK IMMEDIATE MEDICAL ATTENTION IF ANY OF THE FOLLOWING OCCUR: Excessive nausea (feeling sick to your stomach) and/or vomiting.  Severe abdominal pain and distention (swelling).  Trouble swallowing.  Temperature over 101 F (37.8 C).  Rectal bleeding or vomiting of blood.    Your Barrett's esophagus appears stable.  I did take biopsies of this.  I also took biopsies of your stomach for suspected gastritis.  Await pathology results, my office  will contact you.  Continue on omeprazole 40 mg daily.  Avoid NSAIDs as best as you can.  Follow-up with Kristen in 6 months or sooner if needed.  I hope you have a great rest of your week!  Elon Alas. Abbey Chatters, D.O. Gastroenterology and Hepatology [facility]

## 2021-10-01 NOTE — Telephone Encounter (Signed)
Noted  

## 2021-10-01 NOTE — Telephone Encounter (Signed)
Rx sent 

## 2021-10-01 NOTE — Anesthesia Preprocedure Evaluation (Signed)
Anesthesia Evaluation  Patient identified by MRN, date of birth, ID band Patient awake    Reviewed: Allergy & Precautions, NPO status , Patient's Chart, lab work & pertinent test results  History of Anesthesia Complications (+) PROLONGED EMERGENCE and history of anesthetic complications  Airway        Dental  (+) Dental Advisory Given   Pulmonary Current Smoker,    Pulmonary exam normal breath sounds clear to auscultation       Cardiovascular Exercise Tolerance: Good hypertension, Pt. on medications Normal cardiovascular exam Rhythm:Regular Rate:Normal     Neuro/Psych negative neurological ROS  negative psych ROS   GI/Hepatic Neg liver ROS, GERD  Medicated and Controlled,  Endo/Other  diabetes, Poorly Controlled, Type 2, Oral Hypoglycemic Agents  Renal/GU Renal disease  negative genitourinary   Musculoskeletal negative musculoskeletal ROS (+)   Abdominal   Peds negative pediatric ROS (+)  Hematology negative hematology ROS (+)   Anesthesia Other Findings   Reproductive/Obstetrics negative OB ROS                            Anesthesia Physical Anesthesia Plan  ASA: 2  Anesthesia Plan: General   Post-op Pain Management: Minimal or no pain anticipated   Induction: Intravenous  PONV Risk Score and Plan: TIVA  Airway Management Planned: Nasal Cannula and Natural Airway  Additional Equipment:   Intra-op Plan:   Post-operative Plan:   Informed Consent: I have reviewed the patients History and Physical, chart, labs and discussed the procedure including the risks, benefits and alternatives for the proposed anesthesia with the patient or authorized representative who has indicated his/her understanding and acceptance.     Dental advisory given  Plan Discussed with: CRNA and Surgeon  Anesthesia Plan Comments:         Anesthesia Quick Evaluation

## 2021-10-01 NOTE — Op Note (Signed)
[facility] Patient Name: Douglas Dorsey Procedure Date: 10/01/2021 1:11 PM MRN: [id number] Date of Birth: 03/06/1976 Attending MD: Elon Alas. Abbey Chatters DO CSN: [id number] Age: 46 Admit Type: Outpatient Procedure:                Upper GI endoscopy Indications:              Surveillance procedure, Follow-up of Barrett's                            esophagus Providers:                Elon Alas. Abbey Chatters, DO, Janeece Riggers, RN, Lambert Mody, Kristine L. Risa Grill, Technician Referring MD:              Medicines:                See the Anesthesia note for documentation of the                            administered medications Complications:            No immediate complications. Estimated Blood Loss:     Estimated blood loss was minimal. Procedure:                Pre-Anesthesia Assessment:                           - The anesthesia plan was to use monitored                            anesthesia care (MAC).                           After obtaining informed consent, the endoscope was                            passed under direct vision. Throughout the                            procedure, the patient's blood pressure, pulse, and                            oxygen saturations were monitored continuously. The                            [id number]-H190 ([id number]) scope was introduced through the                            mouth, and advanced to the second part of duodenum.                            The upper GI endoscopy was accomplished without                            difficulty. The patient tolerated  the procedure                            well. Scope In: 1:23:23 PM Scope Out: 1:27:54 PM Total Procedure Duration: 0 hours 4 minutes 31 seconds  Findings:      A small hiatal hernia was present.      There were esophageal mucosal changes secondary to established       short-segment Barrett's disease present at the gastroesophageal       junction. The maximum  longitudinal extent of these mucosal changes was 2       cm in length. 3 tongues. Biopsies were taken with a cold forceps for       histology.      Diffuse moderate inflammation characterized by erythema and linear       erosions was found in the gastric body and in the gastric antrum.       Biopsies were taken with a cold forceps for Helicobacter pylori testing.      The duodenal bulb, first portion of the duodenum and second portion of       the duodenum were normal. Impression:               - Small hiatal hernia.                           - Esophageal mucosal changes secondary to                            established short-segment Barrett's disease.                            Biopsied.                           - Gastritis. Biopsied.                           - Normal duodenal bulb, first portion of the                            duodenum and second portion of the duodenum. Moderate Sedation:      Per Anesthesia Care Recommendation:           - Patient has a contact number available for                            emergencies. The signs and symptoms of potential                            delayed complications were discussed with the                            patient. Return to normal activities tomorrow.                            Written discharge instructions were provided to the  patient.                           - Resume previous diet.                           - Continue present medications.                           - Await pathology results.                           - Repeat upper endoscopy in 3 years for                            surveillance based on pathology results.                           - Return to GI clinic in 6 months. Procedure Code(s):        --- Professional ---                           [phone], Esophagogastroduodenoscopy, flexible,                            transoral; with biopsy, single or multiple Diagnosis Code(s):         --- Professional ---                           K44.9, Diaphragmatic hernia without obstruction or                            gangrene                           K22.70, Barrett's esophagus without dysplasia                           K29.70, Gastritis, unspecified, without bleeding CPT copyright 2019 American Medical Association. All rights reserved. The codes documented in this report are preliminary and upon coder review may  be revised to meet current compliance requirements. Elon Alas. Abbey Chatters, DO [place] Abbey Chatters, DO 10/01/2021 1:31:50 PM This report has been signed electronically. Number of Addenda: 0

## 2021-10-01 NOTE — Telephone Encounter (Signed)
Patient called and said that [facility] did not deliver his hemorrhoid cream.  He wants to know if we can send a prescription in to [facility] for the cream

## 2021-10-01 NOTE — H&P (Signed)
Primary Care Physician:  Soyla Dryer, PA-C Primary Gastroenterologist:  Dr. Abbey Chatters  Pre-Procedure History & Physical: HPI:  Douglas Dorsey is a 46 y.o. Douglas is here for an EGD for surveillance due to history of Barrett's esophagus.   Past Medical History:  Diagnosis Date   Barrett's esophagus 2016   Colon polyp 6629   Complication of anesthesia    pt sts "they told me it took longer for them to wake me up than it should have".   Diabetes mellitus without complication ([place])    Family history of coronary artery disease 12/24/2011   GERD (gastroesophageal reflux disease)    Hypertension    Tobacco abuse     Past Surgical History:  Procedure Laterality Date   COLONOSCOPY N/A 01/20/2014   RMR: Colonic polyps -removed as descrbied above. Friable anal canal hemorrhoids.  Pathology with tubular adenoma.   COLONOSCOPY N/A 02/22/2015   Surgeon: Daneil Dolin, MD; normal exam.  Repeat in 10 years.   ESOPHAGOGASTRODUODENOSCOPY N/A 01/20/2014   UTM:[id number] EG junction abnoraml distal esophagus- query  short segment Barretts -status post esophageal biospy. Hiatal hernia. Abnormal gastric mucosa-status post gastric biopsy.  Pathology with chronic gastritis, negative for H. pylori, Barrett's esophagus without dysplasia.   ESOPHAGOGASTRODUODENOSCOPY N/A 02/22/2015   Surgeon: Daneil Dolin, MD; Barrett's esophagus without dysplasia, hiatal hernia.  Repeat in 3 years.   HERNIA REPAIR     Umbilical   INCISIONAL HERNIA REPAIR N/A 12/17/2013   Procedure: LAPAROSCOPIC INCISIONAL HERNIA REPAIR WITH MESH;  Surgeon: Jamesetta So, MD;  Location: [facility];  Service: General;  Laterality: N/A;   INSERTION OF MESH N/A 12/17/2013   Procedure: INSERTION OF MESH;  Surgeon: Jamesetta So, MD;  Location: [facility];  Service: General;  Laterality: N/A;   MULTIPLE EXTRACTIONS WITH ALVEOLOPLASTY Bilateral 12/20/2016   Procedure: MULTIPLE EXTRACTION OF TEETH NUMBER ONE, THREE, SIX, SEVEN, EIGHT, NINE, TEN, ELEVEN,  FOURTEEN, NINETEEN, TWENTY, TWENTY-ONE, TWENTY-TWO, TWENTY-THREE, TWENTY-FOUR, TWENTY- FIVE, TWENTY-SIX, TWENTY-SEVEN, TWENTY-EIGHT, TWENTY-NINE, THIRTY  ;   ALVEOLOPLASTY; BIOPSY LESION OF UVULA;  Surgeon: Diona Browner, DDS;  Location: [facility];  Service: Oral Surgery;  Laterality: Bilateral;   MULTIPLE TOOTH EXTRACTIONS     teeth extracted     12 teeth removed in [place]    Prior to Admission medications   Medication Sig Start Date End Date Taking? Authorizing Provider  albuterol (VENTOLIN HFA) 108 (90 Base) MCG/ACT inhaler Inhale 2 puffs into the lungs every 6 (six) hours as needed for wheezing or shortness of breath.   Yes [provider]  atorvastatin (LIPITOR) 20 MG tablet Take 1 tablet (20 mg total) by mouth daily. 04/26/21  Yes Soyla Dryer, PA-C  lisinopril (ZESTRIL) 20 MG tablet Take 1 tablet (20 mg total) by mouth daily. 04/26/21  Yes Soyla Dryer, PA-C  metFORMIN (GLUCOPHAGE) 500 MG tablet Take 1 tablet (500 mg total) by mouth 2 (two) times daily with a meal. 04/26/21  Yes Soyla Dryer, PA-C  omeprazole (PRILOSEC) 40 MG capsule Take 1 capsule (40 mg total) by mouth daily. 08/22/21  Yes Erenest Rasher, PA-C  hydrocortisone (ANUSOL-HC) 2.5 % rectal cream Place 1 application rectally 2 (two) times daily. Patient not taking: Reported on 09/25/2021 08/22/21   Erenest Rasher, PA-C    Allergies as of 08/23/2021 - Review Complete 08/22/2021  Allergen Reaction Noted   No known allergies  12/19/2016    Family History  Problem Relation Age of Onset   Diabetes Mother    Hypertension  Mother    Heart disease Mother    Lung disease Mother    Heart failure Father    Heart attack Father    Heart disease Sister    Mental illness Sister    Seizures Sister    Heart attack Brother    Hypertension Brother    Heart disease Brother    Heart disease Brother    Mental illness Brother    Heart attack Maternal Grandmother    Diabetes Maternal Grandmother    Pneumonia  Maternal Grandfather    COPD Maternal Grandfather    Emphysema Maternal Grandfather    Lung disease Maternal Grandfather    Colon cancer Neg Hx    Inflammatory bowel disease Neg Hx    Cancer - Colon Neg Hx     Social History   Socioeconomic History   Marital status: Married    Spouse name: Not on file   Number of children: Not on file   Years of education: Not on file   Highest education level: Not on file  Occupational History   Not on file  Tobacco Use   Smoking status: Every Day    Packs/day: 1.50    Years: 30.00    Pack years: 45.00    Types: Cigarettes   Smokeless tobacco: Never  Vaping Use   Vaping Use: Some days   Last attempt to quit: 08/24/2017  Substance and Sexual Activity   Alcohol use: No   Drug use: No   Sexual activity: Yes    Birth control/protection: None  Other Topics Concern   Not on file  Social History Narrative   Not on file   Social Determinants of Health   Financial Resource Strain: Not on file  Food Insecurity: Not on file  Transportation Needs: Not on file  Physical Activity: Not on file  Stress: Not on file  Social Connections: Not on file  Intimate Partner Violence: Not on file    Review of Systems: See HPI, otherwise negative ROS  Physical Exam: Vital signs in last 24 hours: Temp:  [97.6 F (36.4 C)] 97.6 F (36.4 C) (02/27 1226) Pulse Rate:  [98] 98 (02/27 1226) Resp:  [14] 14 (02/27 1226) BP: (153)/(92) 153/92 (02/27 1226) SpO2:  [100 %] 100 % (02/27 1226) Weight:  [120.2 kg] 120.2 kg (02/27 1226)   General:   Alert,  Well-developed, well-nourished, pleasant and cooperative in NAD Head:  Normocephalic and atraumatic. Eyes:  Sclera clear, no icterus.   Conjunctiva pink. Ears:  Normal auditory acuity. Nose:  No deformity, discharge,  or lesions. Mouth:  No deformity or lesions, dentition normal. Neck:  Supple; no masses or thyromegaly. Lungs:  Clear throughout to auscultation.   No wheezes, crackles, or rhonchi. No  acute distress. Heart:  Regular rate and rhythm; no murmurs, clicks, rubs,  or gallops. Abdomen:  Soft, nontender and nondistended. No masses, hepatosplenomegaly or hernias noted. Normal bowel sounds, without guarding, and without rebound.   Msk:  Symmetrical without gross deformities. Normal posture. Extremities:  Without clubbing or edema. Neurologic:  Alert and  oriented x4;  grossly normal neurologically. Skin:  Intact without significant lesions or rashes. Cervical Nodes:  No significant cervical adenopathy. Psych:  Alert and cooperative. Normal mood and affect.  Impression/Plan: BAIRON KLEMANN is here for an EGD for surveillance due to history of Barrett's esophagus.   The risks of the procedure including infection, bleed, or perforation as well as benefits, limitations, alternatives and imponderables have been reviewed with the  patient. Questions have been answered. All parties agreeable.

## 2021-10-01 NOTE — Transfer of Care (Signed)
Immediate Anesthesia Transfer of Care Note  Patient: Douglas Dorsey  Procedure(s) Performed: ESOPHAGOGASTRODUODENOSCOPY (EGD) WITH PROPOFOL BIOPSY  Patient Location: Short Stay  Anesthesia Type:MAC  Level of Consciousness: sedated and patient cooperative  Airway & Oxygen Therapy: Patient Spontanous Breathing  Post-op Assessment: Report given to RN, Post -op Vital signs reviewed and stable and Patient moving all extremities  Post vital signs: Reviewed and stable  Last Vitals:  Vitals Value Taken Time  BP 111/81 10/01/21 1332  Temp 36.8 C 10/01/21 1332  Pulse    Resp 20 10/01/21 1332  SpO2 97 % 10/01/21 1332    Last Pain:  Vitals:   10/01/21 1332  TempSrc: Oral  PainSc: 0-No pain         Complications: No notable events documented.

## 2021-10-03 LAB — SURGICAL PATHOLOGY

## 2021-10-04 ENCOUNTER — Encounter (HOSPITAL_COMMUNITY): Payer: Self-pay | Admitting: Internal Medicine

## 2021-10-29 ENCOUNTER — Other Ambulatory Visit: Payer: Self-pay | Admitting: Physician Assistant

## 2022-03-28 NOTE — Progress Notes (Deleted)
Referring Provider: Soyla Dryer, PA-C Primary Care Physician:  Soyla Dryer, PA-C Primary GI Physician: Dr. Gala Romney  No chief complaint on file.   HPI:   Douglas Dorsey is a 46 y.o. male with history of GERD, Barrett's esophagus, rectal bleeding in the setting of hemorrhoids, adenomatous colon polyp in 2015 presenting today for follow-up s/p EGD.  Last seen in our office 08/22/21.  Reflux was well controlled on omeprazole 40 mg daily without alarm symptoms.  Reported rectal bleeding with most every bowel movement.  Stated this was a chronic issue for him and had never stopped after his colonoscopy in 2015/2016, but had picked up in frequency.  Anusol cream was helpful previously, but had not used this in quite some time.  Stools are [facility] 3 without straining.  Recommended EGD for Barrett's esophagus surveillance, continue omeprazole 40 mg daily, anusol rectal cream for hemorrhoids, benefiber, and consider colonoscopy if persistent rectal bleeding.   EGD 10/01/21: Small hiatal hernia, Barrett's esophagus biopsy, gastritis biopsied, normal examined duodenum.  Pathology with Barrett's esophagus negative for dysplasia, reactive gastropathy negative for H. pylori.  Recommended repeat EGD in 3 years.  Today:   GERD:   Rectal bleeding:    TCS in 2015 with 2 small polyps removed- tubular adenoma, polypoid fragment of benign colonic mucosa.   Past Medical History:  Diagnosis Date   Barrett's esophagus 2016   Colon polyp 3893   Complication of anesthesia    pt sts "they told me it took longer for them to wake me up than it should have".   Diabetes mellitus without complication ([place])    Family history of coronary artery disease 12/24/2011   GERD (gastroesophageal reflux disease)    Hypertension    Tobacco abuse     Past Surgical History:  Procedure Laterality Date   BIOPSY  10/01/2021   Procedure: BIOPSY;  Surgeon: Eloise Harman, DO;  [facility];  Service:  Endoscopy;;   COLONOSCOPY N/A 01/20/2014   RMR: Colonic polyps -removed as descrbied above. Friable anal canal hemorrhoids.  Pathology with tubular adenoma.   COLONOSCOPY N/A 02/22/2015   Surgeon: Daneil Dolin, MD; normal exam.  Repeat in 10 years.   ESOPHAGOGASTRODUODENOSCOPY N/A 01/20/2014   TDS:KAJGOTLX EG junction abnoraml distal esophagus- query  short segment Barretts -status post esophageal biospy. Hiatal hernia. Abnormal gastric mucosa-status post gastric biopsy.  Pathology with chronic gastritis, negative for H. pylori, Barrett's esophagus without dysplasia.   ESOPHAGOGASTRODUODENOSCOPY N/A 02/22/2015   Surgeon: Daneil Dolin, MD; Barrett's esophagus without dysplasia, hiatal hernia.  Repeat in 3 years.   ESOPHAGOGASTRODUODENOSCOPY (EGD) WITH PROPOFOL N/A 10/01/2021   Procedure: ESOPHAGOGASTRODUODENOSCOPY (EGD) WITH PROPOFOL;  Surgeon: Eloise Harman, DO;  [facility];  Service: Endoscopy;  Laterality: N/A;  2:00pm   HERNIA REPAIR     Umbilical   INCISIONAL HERNIA REPAIR N/A 12/17/2013   Procedure: LAPAROSCOPIC INCISIONAL HERNIA REPAIR WITH MESH;  Surgeon: Jamesetta So, MD;  Location: [facility];  Service: General;  Laterality: N/A;   INSERTION OF MESH N/A 12/17/2013   Procedure: INSERTION OF MESH;  Surgeon: Jamesetta So, MD;  Location: [facility];  Service: General;  Laterality: N/A;   MULTIPLE EXTRACTIONS WITH ALVEOLOPLASTY Bilateral 12/20/2016   Procedure: MULTIPLE EXTRACTION OF TEETH NUMBER ONE, THREE, SIX, SEVEN, EIGHT, NINE, TEN, ELEVEN, FOURTEEN, NINETEEN, TWENTY, TWENTY-ONE, TWENTY-TWO, TWENTY-THREE, TWENTY-FOUR, TWENTY- FIVE, TWENTY-SIX, TWENTY-SEVEN, TWENTY-EIGHT, TWENTY-NINE, THIRTY  ;   ALVEOLOPLASTY; BIOPSY LESION OF UVULA;  Surgeon: Diona Browner,  DDS;  Location: [facility];  Service: Oral Surgery;  Laterality: Bilateral;   MULTIPLE TOOTH EXTRACTIONS     teeth extracted     12 teeth removed in [place]    Current Outpatient Medications  Medication Sig Dispense  Refill   albuterol (VENTOLIN HFA) 108 (90 Base) MCG/ACT inhaler Inhale 2 puffs into the lungs every 6 (six) hours as needed for wheezing or shortness of breath.     atorvastatin (LIPITOR) 20 MG tablet Take 1 tablet (20 mg total) by mouth daily. 90 tablet 1   hydrocortisone (ANUSOL-HC) 2.5 % rectal cream Place 1 application rectally 2 (two) times daily. For 7-10 days at a time. 30 g 1   lisinopril (ZESTRIL) 20 MG tablet Take 1 tablet (20 mg total) by mouth daily. 90 tablet 1   metFORMIN (GLUCOPHAGE) 500 MG tablet Take 1 tablet (500 mg total) by mouth 2 (two) times daily with a meal. 180 tablet 1   omeprazole (PRILOSEC) 40 MG capsule Take 1 capsule (40 mg total) by mouth daily. 90 capsule 3   No current facility-administered medications for this visit.    Allergies as of 04/01/2022   (No Active Allergies)    Family History  Problem Relation Age of Onset   Diabetes Mother    Hypertension Mother    Heart disease Mother    Lung disease Mother    Heart failure Father    Heart attack Father    Heart disease Sister    Mental illness Sister    Seizures Sister    Heart attack Brother    Hypertension Brother    Heart disease Brother    Heart disease Brother    Mental illness Brother    Heart attack Maternal Grandmother    Diabetes Maternal Grandmother    Pneumonia Maternal Grandfather    COPD Maternal Grandfather    Emphysema Maternal Grandfather    Lung disease Maternal Grandfather    Colon cancer Neg Hx    Inflammatory bowel disease Neg Hx    Cancer - Colon Neg Hx     Social History   Socioeconomic History   Marital status: Married    Spouse name: Not on file   Number of children: Not on file   Years of education: Not on file   Highest education level: Not on file  Occupational History   Not on file  Tobacco Use   Smoking status: Every Day    Packs/day: 1.50    Years: 30.00    Total pack years: 45.00    Types: Cigarettes   Smokeless tobacco: Never  Vaping Use    Vaping Use: Some days   Last attempt to quit: 08/24/2017  Substance and Sexual Activity   Alcohol use: No   Drug use: No   Sexual activity: Yes    Birth control/protection: None  Other Topics Concern   Not on file  Social History Narrative   Not on file   Social Determinants of Health   Financial Resource Strain: Not on file  Food Insecurity: Not on file  Transportation Needs: Not on file  Physical Activity: Not on file  Stress: Not on file  Social Connections: Not on file    Review of Systems: Gen: Denies fever, chills, cold or flulike symptoms, presyncope, syncope. CV: Denies chest pain, palpitations. Resp: Denies dyspnea, cough.  GI:  See HPI Heme: See HPI  Physical Exam: There were no vitals taken for this visit. General:   Alert and oriented. No distress  noted. Pleasant and cooperative.  Head:  Normocephalic and atraumatic. Eyes:  Conjuctiva clear without scleral icterus. Heart:  S1, S2 present without murmurs appreciated. Lungs:  Clear to auscultation bilaterally. No wheezes, rales, or rhonchi. No distress.  Abdomen:  +BS, soft, non-tender and non-distended. No rebound or guarding. No HSM or masses noted. Msk:  Symmetrical without gross deformities. Normal posture. Extremities:  Without edema. Neurologic:  Alert and  oriented x4 Psych:  Normal mood and affect.    Assessment:  46 year old male with history of GERD, Barrett's esophagus, rectal bleeding in the setting of hemorrhoids, adenomatous colon polyp in 2015, presenting today for follow-up.  GERD:  Barrett's esophagus: EGD updated 10/01/2021 with findings consistent with Barrett's esophagus, negative for dysplasia.  Recommended 3-year surveillance.  Rectal bleeding:   Plan:  ***   Aliene Altes, PA-C [facility] 04/01/2022

## 2022-04-01 ENCOUNTER — Ambulatory Visit: Payer: Medicaid Other | Admitting: Gastroenterology

## 2022-04-01 ENCOUNTER — Encounter: Payer: Self-pay | Admitting: Gastroenterology

## 2022-11-29 ENCOUNTER — Encounter: Payer: Self-pay | Admitting: Internal Medicine

## 2022-11-29 ENCOUNTER — Ambulatory Visit: Payer: 59 | Admitting: Internal Medicine

## 2022-11-29 VITALS — BP 148/84 | HR 86 | Ht 69.0 in | Wt 242.6 lb

## 2022-11-29 DIAGNOSIS — K2270 Barrett's esophagus without dysplasia: Secondary | ICD-10-CM

## 2022-11-29 DIAGNOSIS — Z1159 Encounter for screening for other viral diseases: Secondary | ICD-10-CM

## 2022-11-29 DIAGNOSIS — K625 Hemorrhage of anus and rectum: Secondary | ICD-10-CM

## 2022-11-29 DIAGNOSIS — Z720 Tobacco use: Secondary | ICD-10-CM

## 2022-11-29 DIAGNOSIS — Z0001 Encounter for general adult medical examination with abnormal findings: Secondary | ICD-10-CM | POA: Diagnosis not present

## 2022-11-29 DIAGNOSIS — Z1321 Encounter for screening for nutritional disorder: Secondary | ICD-10-CM

## 2022-11-29 DIAGNOSIS — E1169 Type 2 diabetes mellitus with other specified complication: Secondary | ICD-10-CM | POA: Insufficient documentation

## 2022-11-29 DIAGNOSIS — Z114 Encounter for screening for human immunodeficiency virus [HIV]: Secondary | ICD-10-CM

## 2022-11-29 DIAGNOSIS — K219 Gastro-esophageal reflux disease without esophagitis: Secondary | ICD-10-CM

## 2022-11-29 DIAGNOSIS — Z1329 Encounter for screening for other suspected endocrine disorder: Secondary | ICD-10-CM | POA: Diagnosis not present

## 2022-11-29 DIAGNOSIS — E119 Type 2 diabetes mellitus without complications: Secondary | ICD-10-CM | POA: Diagnosis not present

## 2022-11-29 DIAGNOSIS — E785 Hyperlipidemia, unspecified: Secondary | ICD-10-CM

## 2022-11-29 DIAGNOSIS — K649 Unspecified hemorrhoids: Secondary | ICD-10-CM | POA: Diagnosis not present

## 2022-11-29 DIAGNOSIS — I10 Essential (primary) hypertension: Secondary | ICD-10-CM | POA: Diagnosis not present

## 2022-11-29 MED ORDER — ATORVASTATIN CALCIUM 20 MG PO TABS: 20.0000 mg | ORAL_TABLET | Freq: Every day | ORAL | 1 refills | Status: DC

## 2022-11-29 MED ORDER — LISINOPRIL 20 MG PO TABS: 20.0000 mg | ORAL_TABLET | Freq: Every day | ORAL | 1 refills | Status: DC

## 2022-11-29 MED ORDER — HYDROCORTISONE (PERIANAL) 2.5 % EX CREA: 1.0000 | TOPICAL_CREAM | Freq: Two times a day (BID) | CUTANEOUS | 1 refills | Status: DC

## 2022-11-29 MED ORDER — OMEPRAZOLE 40 MG PO CPDR: 40.0000 mg | DELAYED_RELEASE_CAPSULE | Freq: Every day | ORAL | 3 refills | Status: DC

## 2022-11-29 MED ORDER — METFORMIN HCL ER 500 MG PO TB24: 500.0000 mg | ORAL_TABLET | Freq: Two times a day (BID) | ORAL | 1 refills | Status: DC

## 2022-11-29 NOTE — Assessment & Plan Note (Signed)
Previously prescribed atorvastatin 20 mg daily has not taken it recently. -Atorvastatin refilled today -Repeat lipid panel ordered

## 2022-11-29 NOTE — Assessment & Plan Note (Signed)
Currently smoking 1.5 packs/day of cigarettes and has been smoking since age 47.  [name] currently precontemplative with regards to cessation. -The patient was counseled on the dangers of tobacco use, and was advised to quit.  Reviewed strategies to maximize success, including removing cigarettes and smoking materials from environment, stress management, substitution of other forms of reinforcement, support of family/friends, and written materials.

## 2022-11-29 NOTE — Assessment & Plan Note (Signed)
Presenting today to establish care.  Available records and labs been reviewed. -Baseline labs ordered today, including one-time HIV/HCV screenings -Referred to ophthalmology for diabetic eye exam -Medications refilled today -We will tentatively plan for follow-up in 4 weeks for HTN check and lab review

## 2022-11-29 NOTE — Assessment & Plan Note (Signed)
Anusol-HC refilled today

## 2022-11-29 NOTE — Progress Notes (Signed)
New Patient Office Visit  Subjective    Patient ID: Douglas Dorsey, male    DOB: 03-29-1976  Age: 47 y.o. MRN: [id number]  CC:  Chief Complaint  Patient presents with   Establish Care    HPI Douglas Dorsey presents to establish care.  [name] a 47 year old male with a past medical history significant for HTN, DM, HLD, GERD, Barrett's esophagus, and current tobacco use.  He has most recently received primary care at the free clinic of [facility].  [name] reports feeling well today.  His acute concern is requesting medication refills as he has been out for the past 8 months.  [name] currently asymptomatic and has no additional concerns today.  Chronic medical conditions and outstanding preventative care items discussed today are individually addressed in A/P below.  Outpatient Encounter Medications as of 11/29/2022  Medication Sig   albuterol (VENTOLIN HFA) 108 (90 Base) MCG/ACT inhaler Inhale 2 puffs into the lungs every 6 (six) hours as needed for wheezing or shortness of breath.   metFORMIN (GLUCOPHAGE-XR) 500 MG 24 hr tablet Take 1 tablet (500 mg total) by mouth 2 (two) times daily with a meal.   [DISCONTINUED] atorvastatin (LIPITOR) 20 MG tablet Take 1 tablet (20 mg total) by mouth daily.   [DISCONTINUED] lisinopril (ZESTRIL) 20 MG tablet Take 1 tablet (20 mg total) by mouth daily.   [DISCONTINUED] metFORMIN (GLUCOPHAGE) 500 MG tablet Take 1 tablet (500 mg total) by mouth 2 (two) times daily with a meal.   [DISCONTINUED] omeprazole (PRILOSEC) 40 MG capsule Take 1 capsule (40 mg total) by mouth daily.   atorvastatin (LIPITOR) 20 MG tablet Take 1 tablet (20 mg total) by mouth daily.   hydrocortisone (ANUSOL-HC) 2.5 % rectal cream Place 1 Application rectally 2 (two) times daily. For 7-10 days at a time.   lisinopril (ZESTRIL) 20 MG tablet Take 1 tablet (20 mg total) by mouth daily.   omeprazole (PRILOSEC) 40 MG capsule Take 1 capsule (40 mg total) by mouth daily.   [DISCONTINUED]  hydrocortisone (ANUSOL-HC) 2.5 % rectal cream Place 1 application rectally 2 (two) times daily. For 7-10 days at a time. (Patient not taking: Reported on 11/29/2022)   No facility-administered encounter medications on file as of 11/29/2022.    Past Medical History:  Diagnosis Date   Barrett's esophagus 2016   Colon polyp 2015   Complication of anesthesia    pt sts "they told me it took longer for them to wake me up than it should have".   Diabetes mellitus without complication (HCC)    Family history of coronary artery disease 12/24/2011   GERD (gastroesophageal reflux disease)    Hypertension    Tobacco abuse     Past Surgical History:  Procedure Laterality Date   BIOPSY  10/01/2021   Procedure: BIOPSY;  Surgeon: Lanelle Bal, DO;  [facility]: [facility];  Service: Endoscopy;;   COLONOSCOPY N/A 01/20/2014   RMR: 2 small colonic polyps removed. Friable anal canal hemorrhoids.  Pathology with tubular adenoma and polypoid fragment of benign colonic mucosa.   COLONOSCOPY N/A 02/22/2015   Surgeon: Corbin Ade, MD; normal exam.  Repeat in 10 years.   ESOPHAGOGASTRODUODENOSCOPY N/A 01/20/2014   NGE:[id number] EG junction abnoraml distal esophagus- query  short segment Barretts -status post esophageal biospy. Hiatal hernia. Abnormal gastric mucosa-status post gastric biopsy.  Pathology with chronic gastritis, negative for H. pylori, Barrett's esophagus without dysplasia.   ESOPHAGOGASTRODUODENOSCOPY N/A 02/22/2015   Surgeon: Molly Maduro  [name], MD; Barrett's esophagus without dysplasia, hiatal hernia.  Repeat in 3 years.   ESOPHAGOGASTRODUODENOSCOPY (EGD) WITH PROPOFOL N/A 10/01/2021   Procedure: ESOPHAGOGASTRODUODENOSCOPY (EGD) WITH PROPOFOL;  Surgeon: Lanelle Bal, DO;  [facility];  Service: Endoscopy;  Laterality: N/A;  2:00pm   HERNIA REPAIR     Umbilical   INCISIONAL HERNIA REPAIR N/A 12/17/2013   Procedure: LAPAROSCOPIC INCISIONAL HERNIA REPAIR WITH MESH;   Surgeon: Dalia Heading, MD;  Location: [facility];  Service: General;  Laterality: N/A;   INSERTION OF MESH N/A 12/17/2013   Procedure: INSERTION OF MESH;  Surgeon: Dalia Heading, MD;  Location: [facility];  Service: General;  Laterality: N/A;   MULTIPLE EXTRACTIONS WITH ALVEOLOPLASTY Bilateral 12/20/2016   Procedure: MULTIPLE EXTRACTION OF TEETH NUMBER ONE, THREE, SIX, SEVEN, EIGHT, NINE, TEN, ELEVEN, FOURTEEN, NINETEEN, TWENTY, TWENTY-ONE, TWENTY-TWO, TWENTY-THREE, TWENTY-FOUR, TWENTY- FIVE, TWENTY-SIX, TWENTY-SEVEN, TWENTY-EIGHT, TWENTY-NINE, THIRTY  ;   ALVEOLOPLASTY; BIOPSY LESION OF UVULA;  Surgeon: Ocie Doyne, DDS;  Location: [facility];  Service: Oral Surgery;  Laterality: Bilateral;   MULTIPLE TOOTH EXTRACTIONS     teeth extracted     12 teeth removed in [place]    Family History  Problem Relation Age of Onset   Diabetes Mother    Hypertension Mother    Heart disease Mother    Lung disease Mother    Heart failure Father    Heart attack Father    Heart disease Sister    Mental illness Sister    Seizures Sister    Heart attack Brother    Hypertension Brother    Heart disease Brother    Heart disease Brother    Mental illness Brother    Heart attack Maternal Grandmother    Diabetes Maternal Grandmother    Pneumonia Maternal Grandfather    COPD Maternal Grandfather    Emphysema Maternal Grandfather    Lung disease Maternal Grandfather    Colon cancer Neg Hx    Inflammatory bowel disease Neg Hx    Cancer - Colon Neg Hx     Social History   Socioeconomic History   Marital status: Married    Spouse name: Not on file   Number of children: Not on file   Years of education: Not on file   Highest education level: Not on file  Occupational History   Not on file  Tobacco Use   Smoking status: Every Day    Packs/day: 1.50    Years: 30.00    Additional pack years: 0.00    Total pack years: 45.00    Types: Cigarettes   Smokeless tobacco: Never  Vaping Use   Vaping Use: Some  days   Last attempt to quit: 08/24/2017  Substance and Sexual Activity   Alcohol use: No   Drug use: No   Sexual activity: Yes    Birth control/protection: None  Other Topics Concern   Not on file  Social History Narrative   Not on file   Social Determinants of Health   Financial Resource Strain: Not on file  Food Insecurity: Not on file  Transportation Needs: Not on file  Physical Activity: Not on file  Stress: Not on file  Social Connections: Not on file  Intimate Partner Violence: Not on file   Review of Systems  Constitutional:  Negative for chills and fever.  HENT:  Negative for sore throat.   Respiratory:  Negative for cough and shortness of breath.   Cardiovascular:  Negative for chest  pain, palpitations and leg swelling.  Gastrointestinal:  Negative for abdominal pain, blood in stool, constipation, diarrhea, nausea and vomiting.  Genitourinary:  Negative for dysuria and hematuria.  Musculoskeletal:  Negative for myalgias.  Skin:  Negative for itching and rash.  Neurological:  Negative for dizziness and headaches.  Psychiatric/Behavioral:  Negative for depression and suicidal ideas.    Objective    BP (!) 148/84 (BP Location: Left Arm, Patient Position: Sitting, Cuff Size: Large)   Pulse 86   Ht 5\' 9"  (1.753 m)   Wt 242 lb 9.6 oz (110 kg)   SpO2 96%   BMI 35.83 kg/m   Physical Exam Vitals reviewed.  Constitutional:      General: [name] not in acute distress.    Appearance: Normal appearance. [name] obese. [name] not ill-appearing.  HENT:     Head: Normocephalic and atraumatic.     Right Ear: External ear normal.     Left Ear: External ear normal.     Nose: Nose normal. No congestion or rhinorrhea.     Mouth/Throat:     Mouth: Mucous membranes are moist.     Pharynx: Oropharynx is clear.  Eyes:     General: No scleral icterus.    Extraocular Movements: Extraocular movements intact.     Conjunctiva/sclera: Conjunctivae normal.     Pupils: Pupils are  equal, round, and reactive to light.  Cardiovascular:     Rate and Rhythm: Normal rate and regular rhythm.     Pulses: Normal pulses.     Heart sounds: Normal heart sounds. No murmur heard. Pulmonary:     Effort: Pulmonary effort is normal.     Breath sounds: Normal breath sounds. No wheezing, rhonchi or rales.  Abdominal:     General: Abdomen is flat. Bowel sounds are normal. There is no distension.     Palpations: Abdomen is soft. There is mass (Palpable abdominal wall mass, states that he has been told this is adipose tissue).     Tenderness: There is no abdominal tenderness.  Musculoskeletal:        General: No swelling or deformity. Normal range of motion.     Cervical back: Normal range of motion.  Skin:    General: Skin is warm and dry.     Capillary Refill: Capillary refill takes less than 2 seconds.  Neurological:     General: No focal deficit present.     Mental Status: [name] alert and oriented to person, place, and time.     Motor: No weakness.  Psychiatric:        Mood and Affect: Mood normal.        Behavior: Behavior normal.        Thought Content: Thought content normal.    Assessment & Plan:   Problem List Items Addressed This Visit       Hemorrhoids    Anusol-HC refilled today      Essential hypertension    Previously documented history of HTN and has most recently been prescribed lisinopril 20 mg daily which she has been out of for the last 8 months.  BP is elevated today, 148/84. -Lisinopril refilled today -Follow-up in 4 weeks for HTN check      Gastroesophageal reflux disease    Symptoms are well-controlled when he takes omeprazole 40 mg daily, but he has been out of this medication recently. -Omeprazole refilled today      Barrett's esophagus without dysplasia    Followed by GI (  Dr. Marletta Lor).  Last underwent surveillance EGD in February 2023.  GI follow-up recommended for 6 months at that time.  He will need repeat EGD in February  2026. -Recommended that he contact GI to schedule follow-up as he missed his most recent appointment.      Type 2 diabetes mellitus without complications (HCC) - Primary    A1c 8.1 on labs from September 2022.  He has most recently been prescribed metformin 500 mg twice daily. -Start metformin XR 500 mg twice daily -Repeat A1c and urine microalbumin/creatinine ratio ordered today -Ophthalmology referral placed -Follow-up in 4 weeks      Hyperlipidemia associated with type 2 diabetes mellitus (HCC)    Previously prescribed atorvastatin 20 mg daily has not taken it recently. -Atorvastatin refilled today -Repeat lipid panel ordered      Tobacco abuse    Currently smoking 1.5 packs/day of cigarettes and has been smoking since age 30.  [name] currently precontemplative with regards to cessation. -The patient was counseled on the dangers of tobacco use, and was advised to quit.  Reviewed strategies to maximize success, including removing cigarettes and smoking materials from environment, stress management, substitution of other forms of reinforcement, support of family/friends, and written materials.       Encounter for general adult medical examination with abnormal findings    Presenting today to establish care.  Available records and labs been reviewed. -Baseline labs ordered today, including one-time HIV/HCV screenings -Referred to ophthalmology for diabetic eye exam -Medications refilled today -We will tentatively plan for follow-up in 4 weeks for HTN check and lab review      Return in about 4 weeks (around 12/27/2022) for HTN, lab review.   Billie Lade, MD

## 2022-11-29 NOTE — Patient Instructions (Signed)
It was a pleasure to see you today.  Thank you for giving [place] the opportunity to be involved in your care.  Below is a brief recap of your visit and next steps.  We will plan to see you again in 4 weeks.  Summary You have established care today Basic labs ordered Medications refilled Follow up in 4 weeks

## 2022-11-29 NOTE — Assessment & Plan Note (Signed)
Symptoms are well-controlled when he takes omeprazole 40 mg daily, but he has been out of this medication recently. -Omeprazole refilled today

## 2022-11-29 NOTE — Assessment & Plan Note (Signed)
A1c 8.1 on labs from September 2022.  He has most recently been prescribed metformin 500 mg twice daily. -Start metformin XR 500 mg twice daily -Repeat A1c and urine microalbumin/creatinine ratio ordered today -Ophthalmology referral placed -Follow-up in 4 weeks

## 2022-11-29 NOTE — Assessment & Plan Note (Signed)
Previously documented history of HTN and has most recently been prescribed lisinopril 20 mg daily which she has been out of for the last 8 months.  BP is elevated today, 148/84. -Lisinopril refilled today -Follow-up in 4 weeks for HTN check

## 2022-11-29 NOTE — Assessment & Plan Note (Signed)
Followed by GI (Dr. Marletta Lor).  Last underwent surveillance EGD in February 2023.  GI follow-up recommended for 6 months at that time.  He will need repeat EGD in February 2026. -Recommended that he contact GI to schedule follow-up as he missed his most recent appointment.

## 2022-12-01 ENCOUNTER — Other Ambulatory Visit: Payer: Self-pay | Admitting: Internal Medicine

## 2022-12-01 DIAGNOSIS — E559 Vitamin D deficiency, unspecified: Secondary | ICD-10-CM

## 2022-12-01 LAB — CMP14+EGFR
ALT: 14 IU/L (ref 0–44)
AST: 15 IU/L (ref 0–40)
Albumin/Globulin Ratio: 1.5 (ref 1.2–2.2)
Albumin: 4.5 g/dL (ref 4.1–5.1)
Alkaline Phosphatase: 129 IU/L — ABNORMAL HIGH (ref 44–121)
BUN/Creatinine Ratio: 22 — ABNORMAL HIGH (ref 9–20)
BUN: 19 mg/dL (ref 6–24)
Bilirubin Total: 0.5 mg/dL (ref 0.0–1.2)
CO2: 23 mmol/L (ref 20–29)
Calcium: 10.4 mg/dL — ABNORMAL HIGH (ref 8.7–10.2)
Chloride: 95 mmol/L — ABNORMAL LOW (ref 96–106)
Creatinine, Ser: 0.88 mg/dL (ref 0.76–1.27)
Globulin, Total: 3 g/dL (ref 1.5–4.5)
Glucose: 196 mg/dL — ABNORMAL HIGH (ref 70–99)
Potassium: 4.6 mmol/L (ref 3.5–5.2)
Sodium: 136 mmol/L (ref 134–144)
Total Protein: 7.5 g/dL (ref 6.0–8.5)
eGFR: 109 mL/min/{1.73_m2} (ref >59)

## 2022-12-01 LAB — HEMOGLOBIN A1C
Est. average glucose Bld gHb Est-mCnc: 229 mg/dL
Hgb A1c MFr Bld: 9.6 % — ABNORMAL HIGH (ref 4.8–5.6)

## 2022-12-01 LAB — CBC WITH DIFFERENTIAL/PLATELET
Basophils Absolute: 0.1 10*3/uL (ref 0.0–0.2)
Basos: 1 %
EOS (ABSOLUTE): 0.2 10*3/uL (ref 0.0–0.4)
Eos: 2 %
Hematocrit: 51.8 % — ABNORMAL HIGH (ref 37.5–51.0)
Hemoglobin: 17 g/dL (ref 13.0–17.7)
Immature Grans (Abs): 0 10*3/uL (ref 0.0–0.1)
Immature Granulocytes: 0 %
Lymphocytes Absolute: 2.2 10*3/uL (ref 0.7–3.1)
Lymphs: 26 %
MCH: 28.1 pg (ref 26.6–33.0)
MCHC: 32.8 g/dL (ref 31.5–35.7)
MCV: 86 fL (ref 79–97)
Monocytes Absolute: 0.4 10*3/uL (ref 0.1–0.9)
Monocytes: 4 %
Neutrophils Absolute: 5.8 10*3/uL (ref 1.4–7.0)
Neutrophils: 67 %
Platelets: 239 10*3/uL (ref 150–450)
RBC: 6.05 x10E6/uL — ABNORMAL HIGH (ref 4.14–5.80)
RDW: 13.2 % (ref 11.6–15.4)
WBC: 8.7 10*3/uL (ref 3.4–10.8)

## 2022-12-01 LAB — B12 AND FOLATE PANEL
Folate: 3.8 ng/mL (ref >3.0)
Vitamin B-12: 623 pg/mL (ref 232–1245)

## 2022-12-01 LAB — MICROALBUMIN / CREATININE URINE RATIO
Creatinine, Urine: 70.4 mg/dL
Microalb/Creat Ratio: 8 mg/g creat (ref 0–29)
Microalbumin, Urine: 5.9 ug/mL

## 2022-12-01 LAB — TSH+FREE T4
Free T4: 1.28 ng/dL (ref 0.82–1.77)
TSH: 1.55 u[IU]/mL (ref 0.450–4.500)

## 2022-12-01 LAB — LIPID PANEL
Chol/HDL Ratio: 7.7 ratio — ABNORMAL HIGH (ref 0.0–5.0)
Cholesterol, Total: 278 mg/dL — ABNORMAL HIGH (ref 100–199)
HDL: 36 mg/dL — ABNORMAL LOW (ref >39)
LDL Chol Calc (NIH): 198 mg/dL — ABNORMAL HIGH (ref 0–99)
Triglycerides: 227 mg/dL — ABNORMAL HIGH (ref 0–149)
VLDL Cholesterol Cal: 44 mg/dL — ABNORMAL HIGH (ref 5–40)

## 2022-12-01 LAB — VITAMIN D 25 HYDROXY (VIT D DEFICIENCY, FRACTURES): Vit D, 25-Hydroxy: 15.6 ng/mL — ABNORMAL LOW (ref 30.0–100.0)

## 2022-12-01 LAB — HCV AB W REFLEX TO QUANT PCR: HCV Ab: NONREACTIVE

## 2022-12-01 LAB — HIV ANTIBODY (ROUTINE TESTING W REFLEX): HIV Screen 4th Generation wRfx: NONREACTIVE

## 2022-12-01 LAB — HCV INTERPRETATION

## 2022-12-01 MED ORDER — VITAMIN D (ERGOCALCIFEROL) 1.25 MG (50000 UNIT) PO CAPS: 50000.0000 [IU] | ORAL_CAPSULE | ORAL | 0 refills | Status: DC

## 2022-12-04 ENCOUNTER — Other Ambulatory Visit: Payer: Self-pay

## 2022-12-04 ENCOUNTER — Telehealth: Payer: Self-pay | Admitting: Internal Medicine

## 2022-12-04 DIAGNOSIS — E559 Vitamin D deficiency, unspecified: Secondary | ICD-10-CM

## 2022-12-04 MED ORDER — VITAMIN D (ERGOCALCIFEROL) 1.25 MG (50000 UNIT) PO CAPS: 50000.0000 [IU] | ORAL_CAPSULE | ORAL | 0 refills | Status: AC

## 2022-12-04 NOTE — Telephone Encounter (Signed)
Patient called said the vitamin D was called into the wrong pharmacy needs to be sent into pharmacy: [facility]

## 2022-12-04 NOTE — Telephone Encounter (Signed)
LVM

## 2022-12-27 ENCOUNTER — Ambulatory Visit: Payer: 59 | Admitting: Internal Medicine

## 2022-12-27 VITALS — BP 137/81 | HR 92 | Ht 69.0 in | Wt 239.4 lb

## 2022-12-27 DIAGNOSIS — E559 Vitamin D deficiency, unspecified: Secondary | ICD-10-CM | POA: Diagnosis not present

## 2022-12-27 DIAGNOSIS — E669 Obesity, unspecified: Secondary | ICD-10-CM | POA: Diagnosis not present

## 2022-12-27 DIAGNOSIS — R748 Abnormal levels of other serum enzymes: Secondary | ICD-10-CM | POA: Diagnosis not present

## 2022-12-27 DIAGNOSIS — E1165 Type 2 diabetes mellitus with hyperglycemia: Secondary | ICD-10-CM

## 2022-12-27 DIAGNOSIS — E119 Type 2 diabetes mellitus without complications: Secondary | ICD-10-CM

## 2022-12-27 DIAGNOSIS — I10 Essential (primary) hypertension: Secondary | ICD-10-CM

## 2022-12-27 DIAGNOSIS — E1169 Type 2 diabetes mellitus with other specified complication: Secondary | ICD-10-CM

## 2022-12-27 DIAGNOSIS — E785 Hyperlipidemia, unspecified: Secondary | ICD-10-CM | POA: Diagnosis not present

## 2022-12-27 MED ORDER — LISINOPRIL 40 MG PO TABS: 40.0000 mg | ORAL_TABLET | Freq: Every day | ORAL | 3 refills | Status: DC

## 2022-12-27 MED ORDER — OZEMPIC (0.25 OR 0.5 MG/DOSE) 2 MG/3ML ~~LOC~~ SOPN: 0.2500 mg | PEN_INJECTOR | SUBCUTANEOUS | 0 refills | Status: DC

## 2022-12-27 MED ORDER — METFORMIN HCL ER 500 MG PO TB24: 1000.0000 mg | ORAL_TABLET | Freq: Two times a day (BID) | ORAL | 2 refills | Status: DC

## 2022-12-27 MED ORDER — ATORVASTATIN CALCIUM 40 MG PO TABS
40.0000 mg | ORAL_TABLET | Freq: Every day | ORAL | 3 refills | Status: DC
Start: 2022-12-27 — End: 2023-08-28

## 2022-12-27 NOTE — Progress Notes (Unsigned)
Established Patient Office Visit  Subjective   Patient ID: Douglas Dorsey, male    DOB: [birth year]  Age: 47 y.o. MRN: [id number]  Chief Complaint  Patient presents with   Hypertension    Follow up   Diabetes    Follow up   Results    Go over labs   Douglas Dorsey returns to care today for follow-up of HTN, DM, and lab review.  He was last evaluated by me on 4/26 as a new patient presenting to establish care.  His blood pressure was elevated at that time and he reported that he had been out of lisinopril.  Lisinopril was refilled.  HTN check scheduled for today.  Metformin XR 500 mg twice daily was started for treatment of diabetes mellitus.  Baseline labs were ordered.  There have been no acute interval events.  Douglas Dorsey reports feeling well today.  [name] asymptomatic and has no acute concerns to discuss today.  Past Medical History:  Diagnosis Date   Barrett's esophagus 2016   Colon polyp 2015   Complication of anesthesia    pt sts "they told me it took longer for them to wake me up than it should have".   Diabetes mellitus without complication (HCC)    Family history of coronary artery disease 12/24/2011   GERD (gastroesophageal reflux disease)    Hypertension    Tobacco abuse    Past Surgical History:  Procedure Laterality Date   BIOPSY  10/01/2021   Procedure: BIOPSY;  Surgeon: Lanelle Bal, DO;  [facility];  Service: Endoscopy;;   COLONOSCOPY N/A 01/20/2014   RMR: 2 small colonic polyps removed. Friable anal canal hemorrhoids.  Pathology with tubular adenoma and polypoid fragment of benign colonic mucosa.   COLONOSCOPY N/A 02/22/2015   Surgeon: Corbin Ade, MD; normal exam.  Repeat in 10 years.   ESOPHAGOGASTRODUODENOSCOPY N/A 01/20/2014   WUJ:[id number] EG junction abnoraml distal esophagus- query  short segment Barretts -status post esophageal biospy. Hiatal hernia. Abnormal gastric mucosa-status post gastric biopsy.  Pathology with chronic gastritis, negative  for H. pylori, Barrett's esophagus without dysplasia.   ESOPHAGOGASTRODUODENOSCOPY N/A 02/22/2015   Surgeon: Corbin Ade, MD; Barrett's esophagus without dysplasia, hiatal hernia.  Repeat in 3 years.   ESOPHAGOGASTRODUODENOSCOPY (EGD) WITH PROPOFOL N/A 10/01/2021   Procedure: ESOPHAGOGASTRODUODENOSCOPY (EGD) WITH PROPOFOL;  Surgeon: Lanelle Bal, DO;  [facility];  Service: Endoscopy;  Laterality: N/A;  2:00pm   HERNIA REPAIR     Umbilical   INCISIONAL HERNIA REPAIR N/A 12/17/2013   Procedure: LAPAROSCOPIC INCISIONAL HERNIA REPAIR WITH MESH;  Surgeon: Dalia Heading, MD;  Location: [facility];  Service: General;  Laterality: N/A;   INSERTION OF MESH N/A 12/17/2013   Procedure: INSERTION OF MESH;  Surgeon: Dalia Heading, MD;  Location: [facility];  Service: General;  Laterality: N/A;   MULTIPLE EXTRACTIONS WITH ALVEOLOPLASTY Bilateral 12/20/2016   Procedure: MULTIPLE EXTRACTION OF TEETH NUMBER ONE, THREE, SIX, SEVEN, EIGHT, NINE, TEN, ELEVEN, FOURTEEN, NINETEEN, TWENTY, TWENTY-ONE, TWENTY-TWO, TWENTY-THREE, TWENTY-FOUR, TWENTY- FIVE, TWENTY-SIX, TWENTY-SEVEN, TWENTY-EIGHT, TWENTY-NINE, THIRTY  ;   ALVEOLOPLASTY; BIOPSY LESION OF UVULA;  Surgeon: Ocie Doyne, DDS;  Location: [facility];  Service: Oral Surgery;  Laterality: Bilateral;   MULTIPLE TOOTH EXTRACTIONS     teeth extracted     12 teeth removed in [place]   Social History   Tobacco Use   Smoking status: Every Day    Packs/day: 1.50    Years: 30.00  Additional pack years: 0.00    Total pack years: 45.00    Types: Cigarettes   Smokeless tobacco: Never  Vaping Use   Vaping Use: Some days   Last attempt to quit: 08/24/2017  Substance Use Topics   Alcohol use: No   Drug use: No   Family History  Problem Relation Age of Onset   Diabetes Mother    Hypertension Mother    Heart disease Mother    Lung disease Mother    Heart failure Father    Heart attack Father    Heart disease Sister    Mental illness Sister     Seizures Sister    Heart attack Brother    Hypertension Brother    Heart disease Brother    Heart disease Brother    Mental illness Brother    Heart attack Maternal Grandmother    Diabetes Maternal Grandmother    Pneumonia Maternal Grandfather    COPD Maternal Grandfather    Emphysema Maternal Grandfather    Lung disease Maternal Grandfather    Colon cancer Neg Hx    Inflammatory bowel disease Neg Hx    Cancer - Colon Neg Hx    No Active Allergies  Review of Systems  Constitutional:  Negative for chills and fever.  HENT:  Negative for sore throat.   Respiratory:  Negative for cough and shortness of breath.   Cardiovascular:  Negative for chest pain, palpitations and leg swelling.  Gastrointestinal:  Negative for abdominal pain, blood in stool, constipation, diarrhea, nausea and vomiting.  Genitourinary:  Negative for dysuria and hematuria.  Musculoskeletal:  Negative for myalgias.  Skin:  Negative for itching and rash.  Neurological:  Negative for dizziness and headaches.  Psychiatric/Behavioral:  Negative for depression and suicidal ideas.       Objective:     BP 137/81   Pulse 92   Ht 5\' 9"  (1.753 m)   Wt 239 lb 6.4 oz (108.6 kg)   SpO2 93%   BMI 35.35 kg/m  BP Readings from Last 3 Encounters:  12/27/22 137/81  11/29/22 (!) 148/84  10/01/21 111/81   Physical Exam Vitals reviewed.  Constitutional:      General: [name] not in acute distress.    Appearance: Normal appearance. [name] obese. [name] not ill-appearing.  HENT:     Head: Normocephalic and atraumatic.     Right Ear: External ear normal.     Left Ear: External ear normal.     Nose: Nose normal. No congestion or rhinorrhea.     Mouth/Throat:     Mouth: Mucous membranes are moist.     Pharynx: Oropharynx is clear.  Eyes:     General: No scleral icterus.    Extraocular Movements: Extraocular movements intact.     Conjunctiva/sclera: Conjunctivae normal.     Pupils: Pupils are equal, round, and  reactive to light.  Cardiovascular:     Rate and Rhythm: Normal rate and regular rhythm.     Pulses: Normal pulses.     Heart sounds: Normal heart sounds. No murmur heard. Pulmonary:     Effort: Pulmonary effort is normal.     Breath sounds: Normal breath sounds. No wheezing, rhonchi or rales.  Abdominal:     General: Abdomen is flat. Bowel sounds are normal. There is no distension.     Palpations: Abdomen is soft.     Tenderness: There is no abdominal tenderness.  Musculoskeletal:        General: No  swelling or deformity. Normal range of motion.     Cervical back: Normal range of motion.  Skin:    General: Skin is warm and dry.     Capillary Refill: Capillary refill takes less than 2 seconds.  Neurological:     General: No focal deficit present.     Mental Status: [name] alert and oriented to person, place, and time.     Motor: No weakness.  Psychiatric:        Mood and Affect: Mood normal.        Behavior: Behavior normal.        Thought Content: Thought content normal.   Last CBC Lab Results  Component Value Date   WBC 8.7 11/29/2022   HGB 17.0 11/29/2022   HCT 51.8 (H) 11/29/2022   MCV 86 11/29/2022   MCH 28.1 11/29/2022   RDW 13.2 11/29/2022   PLT 239 11/29/2022   Last metabolic panel Lab Results  Component Value Date   GLUCOSE 190 (H) 12/27/2022   NA 135 12/27/2022   K 5.7 (H) 12/27/2022   CL 98 12/27/2022   CO2 24 12/27/2022   BUN 20 12/27/2022   CREATININE 1.00 12/27/2022   EGFR 95 12/27/2022   CALCIUM 10.1 12/27/2022   PROT 7.6 12/27/2022   ALBUMIN 4.5 12/27/2022   LABGLOB 3.1 12/27/2022   AGRATIO 1.5 12/27/2022   BILITOT 0.3 12/27/2022   ALKPHOS 126 (H) 12/27/2022   AST 12 12/27/2022   ALT 17 12/27/2022   ANIONGAP 7 09/27/2021   Last lipids Lab Results  Component Value Date   CHOL 278 (H) 11/29/2022   HDL 36 (L) 11/29/2022   LDLCALC 198 (H) 11/29/2022   TRIG 227 (H) 11/29/2022   CHOLHDL 7.7 (H) 11/29/2022   Last hemoglobin A1c Lab Results   Component Value Date   HGBA1C 9.6 (H) 11/29/2022   Last thyroid functions Lab Results  Component Value Date   TSH 1.550 11/29/2022   Last vitamin D Lab Results  Component Value Date   VD25OH 15.6 (L) 11/29/2022   Last vitamin B12 and Folate Lab Results  Component Value Date   VITAMINB12 623 11/29/2022   FOLATE 3.8 11/29/2022   The 10-year ASCVD risk score (Arnett DK, et al., 2019) is: 30.5%    Assessment & Plan:   Problem List Items Addressed This Visit       Essential hypertension    Presenting today for HTN follow-up.  [name] currently prescribed lisinopril 20 mg daily.  This was refilled at his last appointment.  He was previously out for 8 months.  BP today is 137/81. -Increase lisinopril to 40 mg daily for improved HTN control.      Type 2 diabetes mellitus without complications (HCC)    A1c 9.6 on labs from last month.  Metformin XR 500 mg twice daily was started. -Increase metformin XR to 1000 mg twice daily -Through shared decision making, Ozempic 0.25 mg has also been started today      Hyperlipidemia associated with type 2 diabetes mellitus (HCC)    Lipid panel updated last month.  Total cholesterol 278 and LDL 198.  His 10-year ASCVD risk is 30.5%.  [name] currently prescribed atorvastatin 20 mg daily. -Increase atorvastatin to 40 mg daily      Vitamin D deficiency    Noted on recent labs.  [name] currently prescribed high-dose, weekly vitamin D supplementation. -Repeat vitamin D level upon completion of weekly vitamin D supplementation  Elevated alkaline phosphatase level - Primary    Mild alkaline phosphatase elevation noted on recent labs.  Likely attributable to vitamin D deficiency. -Repeat CMP and check GGT today      Return in about 1 month (around 01/27/2023) for DM, HTN, Labs.   Billie Lade, MD

## 2022-12-27 NOTE — Patient Instructions (Signed)
It was a pleasure to see you today.  Thank you for giving [place] the opportunity to be involved in your care.  Below is a brief recap of your visit and next steps.  We will plan to see you again in 3 months.1 month.  Summary Start Ozempic for diabetes Increase lisinopril to 40 mg daily Increase atorvastatin to 40 mg daily Increase Metformin to 1000 mg twice daily Repeat labs Follow up in 1 month for BP check and labs

## 2022-12-28 LAB — CMP14+EGFR
ALT: 17 IU/L (ref 0–44)
AST: 12 IU/L (ref 0–40)
Albumin/Globulin Ratio: 1.5 (ref 1.2–2.2)
Albumin: 4.5 g/dL (ref 4.1–5.1)
Alkaline Phosphatase: 126 IU/L — ABNORMAL HIGH (ref 44–121)
BUN/Creatinine Ratio: 20 (ref 9–20)
BUN: 20 mg/dL (ref 6–24)
Bilirubin Total: 0.3 mg/dL (ref 0.0–1.2)
CO2: 24 mmol/L (ref 20–29)
Calcium: 10.1 mg/dL (ref 8.7–10.2)
Chloride: 98 mmol/L (ref 96–106)
Creatinine, Ser: 1 mg/dL (ref 0.76–1.27)
Globulin, Total: 3.1 g/dL (ref 1.5–4.5)
Glucose: 190 mg/dL — ABNORMAL HIGH (ref 70–99)
Potassium: 5.7 mmol/L — ABNORMAL HIGH (ref 3.5–5.2)
Sodium: 135 mmol/L (ref 134–144)
Total Protein: 7.6 g/dL (ref 6.0–8.5)
eGFR: 95 mL/min/{1.73_m2} (ref >59)

## 2022-12-28 LAB — GAMMA GT: GGT: 33 IU/L (ref 0–65)

## 2023-01-01 ENCOUNTER — Other Ambulatory Visit: Payer: Self-pay

## 2023-01-01 DIAGNOSIS — R748 Abnormal levels of other serum enzymes: Secondary | ICD-10-CM

## 2023-01-02 ENCOUNTER — Encounter: Payer: Self-pay | Admitting: Internal Medicine

## 2023-01-02 DIAGNOSIS — R748 Abnormal levels of other serum enzymes: Secondary | ICD-10-CM | POA: Insufficient documentation

## 2023-01-02 DIAGNOSIS — E559 Vitamin D deficiency, unspecified: Secondary | ICD-10-CM | POA: Insufficient documentation

## 2023-01-02 NOTE — Assessment & Plan Note (Signed)
A1c 9.6 on labs from last month.  Metformin XR 500 mg twice daily was started. -Increase metformin XR to 1000 mg twice daily -Through shared decision making, Ozempic 0.25 mg has also been started today

## 2023-01-02 NOTE — Assessment & Plan Note (Signed)
Lipid panel updated last month.  Total cholesterol 278 and LDL 198.  His 10-year ASCVD risk is 30.5%.  [name] currently prescribed atorvastatin 20 mg daily. -Increase atorvastatin to 40 mg daily

## 2023-01-02 NOTE — Assessment & Plan Note (Signed)
Mild alkaline phosphatase elevation noted on recent labs.  Likely attributable to vitamin D deficiency. -Repeat CMP and check GGT today

## 2023-01-02 NOTE — Assessment & Plan Note (Signed)
Noted on recent labs.  [name] currently prescribed high-dose, weekly vitamin D supplementation. -Repeat vitamin D level upon completion of weekly vitamin D supplementation

## 2023-01-02 NOTE — Assessment & Plan Note (Signed)
Presenting today for HTN follow-up.  [name] currently prescribed lisinopril 20 mg daily.  This was refilled at his last appointment.  He was previously out for 8 months.  BP today is 137/81. -Increase lisinopril to 40 mg daily for improved HTN control.

## 2023-01-27 ENCOUNTER — Ambulatory Visit (INDEPENDENT_AMBULATORY_CARE_PROVIDER_SITE_OTHER): Payer: 59 | Admitting: Internal Medicine

## 2023-01-27 ENCOUNTER — Encounter: Payer: Self-pay | Admitting: Internal Medicine

## 2023-01-27 VITALS — BP 121/78 | HR 99 | Ht 65.0 in | Wt 238.6 lb

## 2023-01-27 DIAGNOSIS — E785 Hyperlipidemia, unspecified: Secondary | ICD-10-CM

## 2023-01-27 DIAGNOSIS — E875 Hyperkalemia: Secondary | ICD-10-CM

## 2023-01-27 DIAGNOSIS — I10 Essential (primary) hypertension: Secondary | ICD-10-CM | POA: Diagnosis not present

## 2023-01-27 DIAGNOSIS — E1169 Type 2 diabetes mellitus with other specified complication: Secondary | ICD-10-CM

## 2023-01-27 DIAGNOSIS — E559 Vitamin D deficiency, unspecified: Secondary | ICD-10-CM | POA: Diagnosis not present

## 2023-01-27 DIAGNOSIS — E119 Type 2 diabetes mellitus without complications: Secondary | ICD-10-CM | POA: Diagnosis not present

## 2023-01-27 NOTE — Patient Instructions (Signed)
It was a pleasure to see you today.  Thank you for giving [place] the opportunity to be involved in your care.  Below is a brief recap of your visit and next steps.  We will plan to see you again in 1 month.  Summary No medication changes today Repeat chemistry panel ordered Podiatry referral placed Follow up in 1 month

## 2023-01-27 NOTE — Assessment & Plan Note (Signed)
Atorvastatin was increased to 40 mg daily for improved LDL control in the setting of diabetes mellitus as well as a significantly elevated 10-year ASCVD risk.  He has not experienced any adverse side effects. -Repeat CMP today -Repeat lipid panel at follow-up in 1 month

## 2023-01-27 NOTE — Assessment & Plan Note (Signed)
He remains on high-dose, weekly vitamin D supplementation. -Repeat vitamin D level at follow-up in 1 month

## 2023-01-27 NOTE — Assessment & Plan Note (Addendum)
Presenting today for HTN follow-up.  Lisinopril was increased to 40 mg daily at his last appointment, but was stopped in the setting of hyperkalemia.  Repeat BMP in 1 week was recommended but not completed.  Interestingly, his blood pressure has improved today, 121/78.  He has not taken lisinopril since his last appointment. -Repeat BMP today.  If hyperkalemia has resolved and renal function remains stable, we will resume lisinopril at 20 mg.

## 2023-01-27 NOTE — Progress Notes (Signed)
Established Patient Office Visit  Subjective   Patient ID: Douglas Dorsey, male    DOB: Oct 13, 1975  Age: 47 y.o. MRN: [id number]  Chief Complaint  Patient presents with   Hypertension    Follow up   Diabetes    Follow up   Douglas Dorsey returns to care today for DM and HTN follow-up.  He was last evaluated by me on 5/24.  Atorvastatin was increased to 40 mg daily, Ozempic 0.25 mg weekly was initiated, metformin XR was increased to 1000 mg twice daily, and lisinopril was increased to 40 mg daily.  4-week follow-up was arranged.  There have been no acute interval events. Douglas Dorsey reports feeling well today.  [name] asymptomatic and has no acute concerns discussed.  He has not experienced any adverse side effects with recent medication adjustments.  He reports today that he was unable to start Ozempic due to insurance coverage.  Past Medical History:  Diagnosis Date   Barrett's esophagus 2016   Colon polyp 2015   Complication of anesthesia    pt sts "they told me it took longer for them to wake me up than it should have".   Diabetes mellitus without complication (HCC)    Family history of coronary artery disease 12/24/2011   GERD (gastroesophageal reflux disease)    Hypertension    Tobacco abuse    Past Surgical History:  Procedure Laterality Date   BIOPSY  10/01/2021   Procedure: BIOPSY;  Surgeon: Lanelle Bal, DO;  Location: [facility];  Service: Endoscopy;;   COLONOSCOPY N/A 01/20/2014   RMR: 2 small colonic polyps removed. Friable anal canal hemorrhoids.  Pathology with tubular adenoma and polypoid fragment of benign colonic mucosa.   COLONOSCOPY N/A 02/22/2015   Surgeon: Corbin Ade, MD; normal exam.  Repeat in 10 years.   ESOPHAGOGASTRODUODENOSCOPY N/A 01/20/2014   AOZ:HYQMVHQI EG junction abnoraml distal esophagus- query  short segment Barretts -status post esophageal biospy. Hiatal hernia. Abnormal gastric mucosa-status post gastric biopsy.  Pathology with chronic  gastritis, negative for H. pylori, Barrett's esophagus without dysplasia.   ESOPHAGOGASTRODUODENOSCOPY N/A 02/22/2015   Surgeon: Corbin Ade, MD; Barrett's esophagus without dysplasia, hiatal hernia.  Repeat in 3 years.   ESOPHAGOGASTRODUODENOSCOPY (EGD) WITH PROPOFOL N/A 10/01/2021   Procedure: ESOPHAGOGASTRODUODENOSCOPY (EGD) WITH PROPOFOL;  Surgeon: Lanelle Bal, DO;  Location: [facility];  Service: Endoscopy;  Laterality: N/A;  2:00pm   HERNIA REPAIR     Umbilical   INCISIONAL HERNIA REPAIR N/A 12/17/2013   Procedure: LAPAROSCOPIC INCISIONAL HERNIA REPAIR WITH MESH;  Surgeon: Dalia Heading, MD;  Location: [facility];  Service: General;  Laterality: N/A;   INSERTION OF MESH N/A 12/17/2013   Procedure: INSERTION OF MESH;  Surgeon: Dalia Heading, MD;  Location: [facility];  Service: General;  Laterality: N/A;   MULTIPLE EXTRACTIONS WITH ALVEOLOPLASTY Bilateral 12/20/2016   Procedure: MULTIPLE EXTRACTION OF TEETH NUMBER ONE, THREE, SIX, SEVEN, EIGHT, NINE, TEN, ELEVEN, FOURTEEN, NINETEEN, TWENTY, TWENTY-ONE, TWENTY-TWO, TWENTY-THREE, TWENTY-FOUR, TWENTY- FIVE, TWENTY-SIX, TWENTY-SEVEN, TWENTY-EIGHT, TWENTY-NINE, THIRTY  ;   ALVEOLOPLASTY; BIOPSY LESION OF UVULA;  Surgeon: Ocie Doyne, DDS;  Location: [facility];  Service: Oral Surgery;  Laterality: Bilateral;   MULTIPLE TOOTH EXTRACTIONS     teeth extracted     12 teeth removed in [place]   Social History   Tobacco Use   Smoking status: Every Day    Packs/day: 1.50    Years: 30.00    Additional pack years: 0.00  Total pack years: 45.00    Types: Cigarettes   Smokeless tobacco: Never  Vaping Use   Vaping Use: Some days   Last attempt to quit: 08/24/2017  Substance Use Topics   Alcohol use: No   Drug use: No   Family History  Problem Relation Age of Onset   Diabetes Mother    Hypertension Mother    Heart disease Mother    Lung disease Mother    Heart failure Father    Heart attack Father    Heart disease Sister    Mental  illness Sister    Seizures Sister    Heart attack Brother    Hypertension Brother    Heart disease Brother    Heart disease Brother    Mental illness Brother    Heart attack Maternal Grandmother    Diabetes Maternal Grandmother    Pneumonia Maternal Grandfather    COPD Maternal Grandfather    Emphysema Maternal Grandfather    Lung disease Maternal Grandfather    Colon cancer Neg Hx    Inflammatory bowel disease Neg Hx    Cancer - Colon Neg Hx    No Known Allergies  Review of Systems  Constitutional:  Negative for chills and fever.  HENT:  Negative for sore throat.   Respiratory:  Negative for cough and shortness of breath.   Cardiovascular:  Negative for chest pain, palpitations and leg swelling.  Gastrointestinal:  Negative for abdominal pain, blood in stool, constipation, diarrhea, nausea and vomiting.  Genitourinary:  Negative for dysuria and hematuria.  Musculoskeletal:  Negative for myalgias.  Skin:  Negative for itching and rash.  Neurological:  Negative for dizziness and headaches.  Psychiatric/Behavioral:  Negative for depression and suicidal ideas.   \   Objective:     BP 121/78   Pulse 99   Ht 5\' 5"  (1.651 m)   Wt 238 lb 9.6 oz (108.2 kg)   SpO2 93%   BMI 39.71 kg/m  BP Readings from Last 3 Encounters:  01/27/23 121/78  12/27/22 137/81  11/29/22 (!) 148/84   Physical Exam Vitals reviewed.  Constitutional:      General: [name] not in acute distress.    Appearance: Normal appearance. [name] obese. [name] not ill-appearing.  HENT:     Head: Normocephalic and atraumatic.     Right Ear: External ear normal.     Left Ear: External ear normal.     Nose: Nose normal. No congestion or rhinorrhea.     Mouth/Throat:     Mouth: Mucous membranes are moist.     Pharynx: Oropharynx is clear.  Eyes:     General: No scleral icterus.    Extraocular Movements: Extraocular movements intact.     Conjunctiva/sclera: Conjunctivae normal.     Pupils: Pupils are equal,  round, and reactive to light.  Cardiovascular:     Rate and Rhythm: Normal rate and regular rhythm.     Pulses: Normal pulses.     Heart sounds: Normal heart sounds. No murmur heard. Pulmonary:     Effort: Pulmonary effort is normal.     Breath sounds: Normal breath sounds. No wheezing, rhonchi or rales.  Abdominal:     General: Abdomen is flat. Bowel sounds are normal. There is no distension.     Palpations: Abdomen is soft.     Tenderness: There is no abdominal tenderness.  Musculoskeletal:        General: No swelling or deformity. Normal range of motion.  Cervical back: Normal range of motion.  Skin:    General: Skin is warm and dry.     Capillary Refill: Capillary refill takes less than 2 seconds.  Neurological:     General: No focal deficit present.     Mental Status: [name] alert and oriented to person, place, and time.     Motor: No weakness.  Psychiatric:        Mood and Affect: Mood normal.        Behavior: Behavior normal.        Thought Content: Thought content normal.    Diabetic foot exam was performed.  Visual Findings: thickened toenails bilaterally, callus on dorsal aspect of left foot Posterior tibialis and dorsalis pulse intact bilaterally.  Intact to touch and monofilament testing bilaterally.    Last CBC Lab Results  Component Value Date   WBC 8.7 11/29/2022   HGB 17.0 11/29/2022   HCT 51.8 (H) 11/29/2022   MCV 86 11/29/2022   MCH 28.1 11/29/2022   RDW 13.2 11/29/2022   PLT 239 11/29/2022   Last metabolic panel Lab Results  Component Value Date   GLUCOSE 190 (H) 12/27/2022   NA 135 12/27/2022   K 5.7 (H) 12/27/2022   CL 98 12/27/2022   CO2 24 12/27/2022   BUN 20 12/27/2022   CREATININE 1.00 12/27/2022   EGFR 95 12/27/2022   CALCIUM 10.1 12/27/2022   PROT 7.6 12/27/2022   ALBUMIN 4.5 12/27/2022   LABGLOB 3.1 12/27/2022   AGRATIO 1.5 12/27/2022   BILITOT 0.3 12/27/2022   ALKPHOS 126 (H) 12/27/2022   AST 12 12/27/2022   ALT 17  12/27/2022   ANIONGAP 7 09/27/2021   Last lipids Lab Results  Component Value Date   CHOL 278 (H) 11/29/2022   HDL 36 (L) 11/29/2022   LDLCALC 198 (H) 11/29/2022   TRIG 227 (H) 11/29/2022   CHOLHDL 7.7 (H) 11/29/2022   Last hemoglobin A1c Lab Results  Component Value Date   HGBA1C 9.6 (H) 11/29/2022   Last thyroid functions Lab Results  Component Value Date   TSH 1.550 11/29/2022   Last vitamin D Lab Results  Component Value Date   VD25OH 15.6 (L) 11/29/2022   Last vitamin B12 and Folate Lab Results  Component Value Date   VITAMINB12 623 11/29/2022   FOLATE 3.8 11/29/2022   The 10-year ASCVD risk score (Arnett DK, et al., 2019) is: 25.3%    Assessment & Plan:   Problem List Items Addressed This Visit       Essential hypertension - Primary    Presenting today for HTN follow-up.  Lisinopril was increased to 40 mg daily at his last appointment, but was stopped in the setting of hyperkalemia.  Repeat BMP in 1 week was recommended but not completed.  Interestingly, his blood pressure has improved today, 121/78.  He has not taken lisinopril since his last appointment. -Repeat BMP today.  If hyperkalemia has resolved and renal function remains stable, we will resume lisinopril at 20 mg.      Type 2 diabetes mellitus without complications (HCC)    A1c 9.6 on labs from April.  Metformin XR has been increased to 1000 mg twice daily.  He has not experienced any adverse side effects.  I have also prescribed Ozempic, but he states that he was not able to start Ozempic due to insurance coverage. -Repeat CMP ordered today -Diabetic foot exam completed today.  Podiatry referral placed for management of overgrown toenails and a thickened  callus on the dorsal aspect of the left foot. -We we will further investigate coverage issues with Ozempic.  Can start Trulicity or Victoza if needed. -Repeat A1c at follow-up in 1 month      Hyperlipidemia associated with type 2 diabetes  mellitus (HCC)    Atorvastatin was increased to 40 mg daily for improved LDL control in the setting of diabetes mellitus as well as a significantly elevated 10-year ASCVD risk.  He has not experienced any adverse side effects. -Repeat CMP today -Repeat lipid panel at follow-up in 1 month      Vitamin D deficiency    He remains on high-dose, weekly vitamin D supplementation. -Repeat vitamin D level at follow-up in 1 month      Return in about 1 month (around 02/26/2023) for HTN, DM, HLD, repeat labs.   Billie Lade, MD

## 2023-01-27 NOTE — Assessment & Plan Note (Signed)
A1c 9.6 on labs from April.  Metformin XR has been increased to 1000 mg twice daily.  He has not experienced any adverse side effects.  I have also prescribed Ozempic, but he states that he was not able to start Ozempic due to insurance coverage. -Repeat CMP ordered today -Diabetic foot exam completed today.  Podiatry referral placed for management of overgrown toenails and a thickened callus on the dorsal aspect of the left foot. -We we will further investigate coverage issues with Ozempic.  Can start Trulicity or Victoza if needed. -Repeat A1c at follow-up in 1 month

## 2023-01-28 ENCOUNTER — Other Ambulatory Visit: Payer: Self-pay | Admitting: Internal Medicine

## 2023-01-28 DIAGNOSIS — E119 Type 2 diabetes mellitus without complications: Secondary | ICD-10-CM

## 2023-01-28 DIAGNOSIS — I10 Essential (primary) hypertension: Secondary | ICD-10-CM

## 2023-01-28 LAB — CMP14+EGFR
ALT: 16 IU/L (ref 0–44)
AST: 16 IU/L (ref 0–40)
Albumin: 4.2 g/dL (ref 4.1–5.1)
Alkaline Phosphatase: 122 IU/L — ABNORMAL HIGH (ref 44–121)
BUN/Creatinine Ratio: 21 — ABNORMAL HIGH (ref 9–20)
BUN: 19 mg/dL (ref 6–24)
Bilirubin Total: 0.3 mg/dL (ref 0.0–1.2)
CO2: 22 mmol/L (ref 20–29)
Calcium: 10.5 mg/dL — ABNORMAL HIGH (ref 8.7–10.2)
Chloride: 98 mmol/L (ref 96–106)
Creatinine, Ser: 0.9 mg/dL (ref 0.76–1.27)
Globulin, Total: 3.1 g/dL (ref 1.5–4.5)
Glucose: 170 mg/dL — ABNORMAL HIGH (ref 70–99)
Potassium: 5.3 mmol/L — ABNORMAL HIGH (ref 3.5–5.2)
Sodium: 135 mmol/L (ref 134–144)
Total Protein: 7.3 g/dL (ref 6.0–8.5)
eGFR: 108 mL/min/{1.73_m2} (ref >59)

## 2023-01-28 LAB — MAGNESIUM: Magnesium: 2 mg/dL (ref 1.6–2.3)

## 2023-01-28 MED ORDER — LISINOPRIL 5 MG PO TABS: 5.0000 mg | ORAL_TABLET | Freq: Every day | ORAL | 2 refills | Status: DC

## 2023-01-29 ENCOUNTER — Encounter: Payer: Self-pay | Admitting: Internal Medicine

## 2023-02-10 ENCOUNTER — Encounter: Payer: Self-pay | Admitting: Internal Medicine

## 2023-02-24 ENCOUNTER — Encounter: Payer: Self-pay | Admitting: Internal Medicine

## 2023-02-24 ENCOUNTER — Ambulatory Visit (INDEPENDENT_AMBULATORY_CARE_PROVIDER_SITE_OTHER): Payer: 59 | Admitting: Internal Medicine

## 2023-02-24 VITALS — BP 138/87 | HR 92 | Ht 69.0 in | Wt 242.4 lb

## 2023-02-24 DIAGNOSIS — E785 Hyperlipidemia, unspecified: Secondary | ICD-10-CM | POA: Diagnosis not present

## 2023-02-24 DIAGNOSIS — E875 Hyperkalemia: Secondary | ICD-10-CM

## 2023-02-24 DIAGNOSIS — E1169 Type 2 diabetes mellitus with other specified complication: Secondary | ICD-10-CM

## 2023-02-24 DIAGNOSIS — E119 Type 2 diabetes mellitus without complications: Secondary | ICD-10-CM

## 2023-02-24 DIAGNOSIS — I10 Essential (primary) hypertension: Secondary | ICD-10-CM | POA: Diagnosis not present

## 2023-02-24 MED ORDER — TRULICITY 0.75 MG/0.5ML ~~LOC~~ SOAJ: 0.7500 mg | SUBCUTANEOUS | 0 refills | Status: DC

## 2023-02-24 NOTE — Patient Instructions (Signed)
It was a pleasure to see you today.  Thank you for giving [place] the opportunity to be involved in your care.  Below is a brief recap of your visit and next steps.  We will plan to see you again in 3 months.   Summary Try Trulicity today Repeat labs Follow up in 3 months

## 2023-02-24 NOTE — Assessment & Plan Note (Signed)
Lisinopril 5 mg daily was resumed after his last appointment.  Previously prescribed 40 mg but this was discontinued in the setting of hyperkalemia.  BP today is 138/87. -Repeat BMP ordered today.  If electrolytes remain within normal limits, increase lisinopril to 10 mg daily.

## 2023-02-24 NOTE — Assessment & Plan Note (Signed)
K+ improved to 5.3 on labs from last month.  Lisinopril 5 mg daily was restarted.  Repeat BMP ordered today.

## 2023-02-24 NOTE — Assessment & Plan Note (Signed)
Atorvastatin has recently been increased to 40 mg daily for improved LDL control in the setting of diabetes mellitus. -Repeat lipid panel ordered today

## 2023-02-24 NOTE — Assessment & Plan Note (Signed)
A1c 9.6 on labs from April.  [name] currently prescribed metformin XR 1000 mg twice daily.  We have attempted to start Ozempic but it has been cost prohibitive.  He does not qualify for payment assistance.  -Trulicity has been prescribed today.  Consider starting glipizide if GLP-1 therapy is not covered.

## 2023-02-24 NOTE — Progress Notes (Signed)
Established Patient Office Visit  Subjective   Patient ID: Douglas Dorsey, male    DOB: 10/03/1975  Age: 47 y.o. MRN: [id number]  Chief Complaint  Patient presents with   Diabetes    Follow up   Hypertension    Follow up   Hyperlipidemia    Follow up   Douglas Dorsey returns to care today for follow-up of HTN, DM, and HLD.  He was last evaluated by me on 6/24.  Repeat labs ordered at that time and 1 month follow-up was arranged for reassessment.  There have been no acute interval events.  Douglas Dorsey reports feeling well today.  He has no acute concerns to discuss.  He reports that he did not qualify for the payment assistance program for Ozempic because he has Nurse, learning disability.  Past Medical History:  Diagnosis Date   Barrett's esophagus 2016   Colon polyp 2015   Complication of anesthesia    pt sts "they told me it took longer for them to wake me up than it should have".   Diabetes mellitus without complication (HCC)    Family history of coronary artery disease 12/24/2011   GERD (gastroesophageal reflux disease)    Hypertension    Tobacco abuse    Past Surgical History:  Procedure Laterality Date   BIOPSY  10/01/2021   Procedure: BIOPSY;  Surgeon: Lanelle Bal, DO;  [facility]: [facility];  Service: Endoscopy;;   COLONOSCOPY N/A 01/20/2014   RMR: 2 small colonic polyps removed. Friable anal canal hemorrhoids.  Pathology with tubular adenoma and polypoid fragment of benign colonic mucosa.   COLONOSCOPY N/A 02/22/2015   Surgeon: Corbin Ade, MD; normal exam.  Repeat in 10 years.   ESOPHAGOGASTRODUODENOSCOPY N/A 01/20/2014   ZHY:QMVHQION EG junction abnoraml distal esophagus- query  short segment Barretts -status post esophageal biospy. Hiatal hernia. Abnormal gastric mucosa-status post gastric biopsy.  Pathology with chronic gastritis, negative for H. pylori, Barrett's esophagus without dysplasia.   ESOPHAGOGASTRODUODENOSCOPY N/A 02/22/2015   Surgeon: Corbin Ade, MD;  Barrett's esophagus without dysplasia, hiatal hernia.  Repeat in 3 years.   ESOPHAGOGASTRODUODENOSCOPY (EGD) WITH PROPOFOL N/A 10/01/2021   Procedure: ESOPHAGOGASTRODUODENOSCOPY (EGD) WITH PROPOFOL;  Surgeon: Lanelle Bal, DO;  [facility];  Service: Endoscopy;  Laterality: N/A;  2:00pm   HERNIA REPAIR     Umbilical   INCISIONAL HERNIA REPAIR N/A 12/17/2013   Procedure: LAPAROSCOPIC INCISIONAL HERNIA REPAIR WITH MESH;  Surgeon: Dalia Heading, MD;  Location: [facility];  Service: General;  Laterality: N/A;   INSERTION OF MESH N/A 12/17/2013   Procedure: INSERTION OF MESH;  Surgeon: Dalia Heading, MD;  Location: [facility];  Service: General;  Laterality: N/A;   MULTIPLE EXTRACTIONS WITH ALVEOLOPLASTY Bilateral 12/20/2016   Procedure: MULTIPLE EXTRACTION OF TEETH NUMBER ONE, THREE, SIX, SEVEN, EIGHT, NINE, TEN, ELEVEN, FOURTEEN, NINETEEN, TWENTY, TWENTY-ONE, TWENTY-TWO, TWENTY-THREE, TWENTY-FOUR, TWENTY- FIVE, TWENTY-SIX, TWENTY-SEVEN, TWENTY-EIGHT, TWENTY-NINE, THIRTY  ;   ALVEOLOPLASTY; BIOPSY LESION OF UVULA;  Surgeon: Ocie Doyne, DDS;  Location: [facility];  Service: Oral Surgery;  Laterality: Bilateral;   MULTIPLE TOOTH EXTRACTIONS     teeth extracted     12 teeth removed in [place]   Social History   Tobacco Use   Smoking status: Every Day    Current packs/day: 1.50    Average packs/day: 1.5 packs/day for 30.0 years (45.0 ttl pk-yrs)    Types: Cigarettes   Smokeless tobacco: Never  Vaping Use   Vaping status: Some  Days   Last attempt to quit: 08/24/2017  Substance Use Topics   Alcohol use: No   Drug use: No   Family History  Problem Relation Age of Onset   Diabetes Mother    Hypertension Mother    Heart disease Mother    Lung disease Mother    Heart failure Father    Heart attack Father    Heart disease Sister    Mental illness Sister    Seizures Sister    Heart attack Brother    Hypertension Brother    Heart disease Brother    Heart disease Brother     Mental illness Brother    Heart attack Maternal Grandmother    Diabetes Maternal Grandmother    Pneumonia Maternal Grandfather    COPD Maternal Grandfather    Emphysema Maternal Grandfather    Lung disease Maternal Grandfather    Colon cancer Neg Hx    Inflammatory bowel disease Neg Hx    Cancer - Colon Neg Hx    No Known Allergies  Review of Systems  Constitutional:  Negative for chills and fever.  HENT:  Negative for sore throat.   Respiratory:  Negative for cough and shortness of breath.   Cardiovascular:  Negative for chest pain, palpitations and leg swelling.  Gastrointestinal:  Negative for abdominal pain, blood in stool, constipation, diarrhea, nausea and vomiting.  Genitourinary:  Negative for dysuria and hematuria.  Musculoskeletal:  Negative for myalgias.  Skin:  Negative for itching and rash.  Neurological:  Negative for dizziness and headaches.  Psychiatric/Behavioral:  Negative for depression and suicidal ideas.      Objective:     BP 138/87   Pulse 92   Ht 5\' 9"  (1.753 m)   Wt 242 lb 6.4 oz (110 kg)   SpO2 93%   BMI 35.80 kg/m  BP Readings from Last 3 Encounters:  02/24/23 138/87  01/27/23 121/78  12/27/22 137/81   Physical Exam Vitals reviewed.  Constitutional:      General: [name] not in acute distress.    Appearance: Normal appearance. [name] obese. [name] not ill-appearing.  HENT:     Head: Normocephalic and atraumatic.     Right Ear: External ear normal.     Left Ear: External ear normal.     Nose: Nose normal. No congestion or rhinorrhea.     Mouth/Throat:     Mouth: Mucous membranes are moist.     Pharynx: Oropharynx is clear.  Eyes:     General: No scleral icterus.    Extraocular Movements: Extraocular movements intact.     Conjunctiva/sclera: Conjunctivae normal.     Pupils: Pupils are equal, round, and reactive to light.  Cardiovascular:     Rate and Rhythm: Normal rate and regular rhythm.     Pulses: Normal pulses.     Heart sounds:  Normal heart sounds. No murmur heard. Pulmonary:     Effort: Pulmonary effort is normal.     Breath sounds: Normal breath sounds. No wheezing, rhonchi or rales.  Abdominal:     General: Abdomen is flat. Bowel sounds are normal. There is no distension.     Palpations: Abdomen is soft.     Tenderness: There is no abdominal tenderness.  Musculoskeletal:        General: No swelling or deformity. Normal range of motion.     Cervical back: Normal range of motion.  Skin:    General: Skin is warm and dry.  Capillary Refill: Capillary refill takes less than 2 seconds.  Neurological:     General: No focal deficit present.     Mental Status: [name] alert and oriented to person, place, and time.     Motor: No weakness.  Psychiatric:        Mood and Affect: Mood normal.        Behavior: Behavior normal.        Thought Content: Thought content normal.   Last CBC Lab Results  Component Value Date   WBC 8.7 11/29/2022   HGB 17.0 11/29/2022   HCT 51.8 (H) 11/29/2022   MCV 86 11/29/2022   MCH 28.1 11/29/2022   RDW 13.2 11/29/2022   PLT 239 11/29/2022   Last metabolic panel Lab Results  Component Value Date   GLUCOSE 170 (H) 01/27/2023   NA 135 01/27/2023   K 5.3 (H) 01/27/2023   CL 98 01/27/2023   CO2 22 01/27/2023   BUN 19 01/27/2023   CREATININE 0.90 01/27/2023   EGFR 108 01/27/2023   CALCIUM 10.5 (H) 01/27/2023   PROT 7.3 01/27/2023   ALBUMIN 4.2 01/27/2023   LABGLOB 3.1 01/27/2023   AGRATIO 1.5 12/27/2022   BILITOT 0.3 01/27/2023   ALKPHOS 122 (H) 01/27/2023   AST 16 01/27/2023   ALT 16 01/27/2023   ANIONGAP 7 09/27/2021   Last lipids Lab Results  Component Value Date   CHOL 278 (H) 11/29/2022   HDL 36 (L) 11/29/2022   LDLCALC 198 (H) 11/29/2022   TRIG 227 (H) 11/29/2022   CHOLHDL 7.7 (H) 11/29/2022   Last hemoglobin A1c Lab Results  Component Value Date   HGBA1C 9.6 (H) 11/29/2022   Last thyroid functions Lab Results  Component Value Date   TSH 1.550  11/29/2022   Last vitamin D Lab Results  Component Value Date   VD25OH 15.6 (L) 11/29/2022   Last vitamin B12 and Folate Lab Results  Component Value Date   VITAMINB12 623 11/29/2022   FOLATE 3.8 11/29/2022   The 10-year ASCVD risk score (Arnett DK, et al., 2019) is: 30.9%    Assessment & Plan:   Problem List Items Addressed This Visit       Essential hypertension    Lisinopril 5 mg daily was resumed after his last appointment.  Previously prescribed 40 mg but this was discontinued in the setting of hyperkalemia.  BP today is 138/87. -Repeat BMP ordered today.  If electrolytes remain within normal limits, increase lisinopril to 10 mg daily.      Type 2 diabetes mellitus without complications (HCC) - Primary    A1c 9.6 on labs from April.  [name] currently prescribed metformin XR 1000 mg twice daily.  We have attempted to start Ozempic but it has been cost prohibitive.  He does not qualify for payment assistance.  -Trulicity has been prescribed today.  Consider starting glipizide if GLP-1 therapy is not covered.      Hyperlipidemia associated with type 2 diabetes mellitus (HCC)    Atorvastatin has recently been increased to 40 mg daily for improved LDL control in the setting of diabetes mellitus. -Repeat lipid panel ordered today      Return in about 3 months (around 05/27/2023).   Billie Lade, MD

## 2023-02-25 ENCOUNTER — Other Ambulatory Visit: Payer: Self-pay | Admitting: Internal Medicine

## 2023-02-25 DIAGNOSIS — I10 Essential (primary) hypertension: Secondary | ICD-10-CM

## 2023-02-25 LAB — LIPID PANEL
Chol/HDL Ratio: 4.9 ratio (ref 0.0–5.0)
Cholesterol, Total: 177 mg/dL (ref 100–199)
HDL: 36 mg/dL — ABNORMAL LOW (ref >39)
LDL Chol Calc (NIH): 114 mg/dL — ABNORMAL HIGH (ref 0–99)
Triglycerides: 151 mg/dL — ABNORMAL HIGH (ref 0–149)
VLDL Cholesterol Cal: 27 mg/dL (ref 5–40)

## 2023-02-25 LAB — BASIC METABOLIC PANEL
BUN/Creatinine Ratio: 13 (ref 9–20)
BUN: 11 mg/dL (ref 6–24)
CO2: 27 mmol/L (ref 20–29)
Calcium: 9.9 mg/dL (ref 8.7–10.2)
Chloride: 95 mmol/L — ABNORMAL LOW (ref 96–106)
Creatinine, Ser: 0.87 mg/dL (ref 0.76–1.27)
Glucose: 233 mg/dL — ABNORMAL HIGH (ref 70–99)
Potassium: 5.2 mmol/L (ref 3.5–5.2)
Sodium: 136 mmol/L (ref 134–144)
eGFR: 109 mL/min/{1.73_m2} (ref >59)

## 2023-02-25 MED ORDER — LISINOPRIL 10 MG PO TABS: 10.0000 mg | ORAL_TABLET | Freq: Every day | ORAL | 3 refills | Status: DC

## 2023-02-27 ENCOUNTER — Ambulatory Visit (INDEPENDENT_AMBULATORY_CARE_PROVIDER_SITE_OTHER): Payer: Medicaid Other

## 2023-02-27 ENCOUNTER — Encounter: Payer: Self-pay | Admitting: Podiatry

## 2023-02-27 ENCOUNTER — Ambulatory Visit: Payer: Medicaid Other | Admitting: Podiatry

## 2023-02-27 DIAGNOSIS — B353 Tinea pedis: Secondary | ICD-10-CM

## 2023-02-27 DIAGNOSIS — M778 Other enthesopathies, not elsewhere classified: Secondary | ICD-10-CM

## 2023-02-27 DIAGNOSIS — M7752 Other enthesopathy of left foot: Secondary | ICD-10-CM

## 2023-02-27 DIAGNOSIS — E1142 Type 2 diabetes mellitus with diabetic polyneuropathy: Secondary | ICD-10-CM | POA: Diagnosis not present

## 2023-02-27 DIAGNOSIS — L84 Corns and callosities: Secondary | ICD-10-CM | POA: Diagnosis not present

## 2023-02-27 DIAGNOSIS — M7751 Other enthesopathy of right foot: Secondary | ICD-10-CM

## 2023-02-27 MED ORDER — KETOCONAZOLE 2 % EX CREA: 1.0000 | TOPICAL_CREAM | Freq: Every day | CUTANEOUS | 0 refills | Status: DC

## 2023-02-27 NOTE — Progress Notes (Signed)
Subjective:  Patient ID: Douglas Dorsey, male    DOB: 01-14-1976,  MRN: [id number]  Chief Complaint  Patient presents with   Callouses    Callus to right foot. Diabetic. Patient is currently taking metformin.    Tinea Pedis    Itchiness to bilateral feet. Patient has tried OTC medications with no relief.    Foot Pain    Bilateral heel and arch pain x couple of years. Throbbing pain.     47 y.o. male presents with the above complaint.  Patient presents with complaint of pain in bilateral heel.  He also notes a callus to the left foot.  Says it hurts with pressure on it.  He also notes itching redness and rash present on bilateral bottom of the feet.  Patient does have a history of diabetes type 2 is on insulin high A1c in the 9 range.  Does have neuropathy.  Review of Systems: Negative except as noted in the HPI. Denies N/V/F/Ch.   Objective:  There were no vitals filed for this visit. There is no height or weight on file to calculate BMI. Constitutional Well developed. Well nourished.  Vascular Dorsalis pedis pulses palpable bilaterally. Posterior tibial pulses palpable bilaterally. Capillary refill normal to all digits.  No cyanosis or clubbing noted. Pedal hair growth normal.  Neurologic Normal speech. Oriented to person, place, and time. Epicritic sensation to light touch grossly present bilaterally.  Dermatologic Nails well groomed and normal in appearance. No open wounds. Red rash present in moccasin distribution bilateral foot -Hyperkeratotic tissue without underlying ulceration present at the plantar aspect of the left foot underneath the third metatarsal head.  Orthopedic: Normal joint ROM without pain or crepitus bilaterally. No visible deformities. Tender to palpation at the plantar calcaneal bursa directly underlying the heel centrally bilateral foot No pain with calcaneal squeeze bilaterally. Ankle ROM diminished range of motion bilaterally. Silfverskiold Test:  positive bilaterally.   Radiographs: Taken and reviewed. No acute fractures or dislocations. No evidence of stress fracture.  Plantar heel spur present. Posterior heel spur present.   Assessment:   1. Calcaneal bursitis (heel), right   2. Calcaneal bursitis (heel), left   3. Capsulitis of foot    Plan:  Patient was evaluated and treated and all questions answered.  Plantar calcaneal bursitis, bilaterally - XR reviewed as above.  - Educated on icing and stretching. Instructions given.  - Injection delivered to the plantar calc bursae as below. - DME: Recommend gel heel cups for shoes - Pharmacologic management: Meloxicam. Educated on risks/benefits and proper taking of medication.  Procedure: Injection Tendon/Ligament insertion plantar calc bursae Location: Bilateral plantar heel bursae at the glabrous junction; medial approach. Skin Prep: alcohol Injectate: 1 cc 0.5% marcaine plain, 1 cc kenalog 10. Disposition: Patient tolerated procedure well. Injection site dressed with a band-aid.  # Tinea pedis bilateral Discussed the etiology and treatment options for tinea pedis.  Discussed topical and oral treatment.  Recommended topical treatment with 2% ketoconazole cream.  This was sent to the patient's pharmacy.  Also discussed appropriate foot hygiene, use of antifungal spray such as Tinactin in shoes, as well as cleaning her foot surfaces such as showers and bathroom floors with bleach.  # Preulcerative callus left foot All symptomatic hyperkeratoses were safely debrided with a sterile #15 blade to patient's level of comfort without incident. We discussed preventative and palliative care of these lesions including supportive and accommodative shoegear, padding, prefabricated and custom molded accommodative orthoses, use of a pumice stone and  lotions/creams daily.   Return in about 4 weeks (around 03/27/2023) for f/u heel pain/ tinea pedis.

## 2023-03-27 ENCOUNTER — Ambulatory Visit (INDEPENDENT_AMBULATORY_CARE_PROVIDER_SITE_OTHER): Payer: 59 | Admitting: Podiatry

## 2023-03-27 ENCOUNTER — Encounter: Payer: Self-pay | Admitting: Podiatry

## 2023-03-27 DIAGNOSIS — M79676 Pain in unspecified toe(s): Secondary | ICD-10-CM | POA: Diagnosis not present

## 2023-03-27 DIAGNOSIS — M7752 Other enthesopathy of left foot: Secondary | ICD-10-CM | POA: Diagnosis not present

## 2023-03-27 DIAGNOSIS — M7751 Other enthesopathy of right foot: Secondary | ICD-10-CM | POA: Diagnosis not present

## 2023-03-27 DIAGNOSIS — B351 Tinea unguium: Secondary | ICD-10-CM

## 2023-03-27 DIAGNOSIS — E1142 Type 2 diabetes mellitus with diabetic polyneuropathy: Secondary | ICD-10-CM | POA: Diagnosis not present

## 2023-03-27 DIAGNOSIS — B353 Tinea pedis: Secondary | ICD-10-CM

## 2023-03-27 MED ORDER — CICLOPIROX 8 % EX SOLN: Freq: Every day | CUTANEOUS | 0 refills | Status: DC

## 2023-03-27 NOTE — Progress Notes (Signed)
  Subjective:  Patient ID: Douglas Dorsey, male    DOB: Sep 20, 1975,  MRN: [id number]  Chief Complaint  Patient presents with   Foot Pain    "It's doing better.  I haven't had much pain since I came the last time."    47 y.o. male presents with the above complaint.  Patient presents with complaint of pain in bilateral heel. Patient had steroid injection at last visit in bilateral hell. Also treated for tinea pedis and callus. Doing better, denies much pain since last visit.  Patient also reports difficulty trimming nails has thickened elongated dystrophic nails x 5 bilateral foot  Review of Systems: Negative except as noted in the HPI. Denies N/V/F/Ch.   Objective:  There were no vitals filed for this visit. There is no height or weight on file to calculate BMI. Constitutional Well developed. Well nourished.  Vascular Dorsalis pedis pulses palpable bilaterally. Posterior tibial pulses palpable bilaterally. Capillary refill normal to all digits.  No cyanosis or clubbing noted. Pedal hair growth normal.  Neurologic Normal speech. Oriented to person, place, and time. Epicritic sensation to light touch grossly present bilaterally.  Dermatologic Nails thickened elongated and dystrophic with yellow discoloration.  Also with subungual debris. No open wounds. Red rash present in moccasin distribution bilateral foot -Hyperkeratotic tissue without underlying ulceration present at the plantar aspect of the left foot underneath the third metatarsal head.  Orthopedic: Normal joint ROM without pain or crepitus bilaterally. No visible deformities. Tender to palpation at the plantar calcaneal bursa directly underlying the heel centrally bilateral foot No pain with calcaneal squeeze bilaterally. Ankle ROM diminished range of motion bilaterally. Silfverskiold Test: positive bilaterally.   Radiographs: Taken and reviewed. No acute fractures or dislocations. No evidence of stress fracture.  Plantar  heel spur present. Posterior heel spur present.   Assessment:   1. Calcaneal bursitis (heel), right   2. Calcaneal bursitis (heel), left   3. Capsulitis of foot   Onychomycosis  Plan:  Patient was evaluated and treated and all questions answered.  Plantar calcaneal bursitis, bilaterally -Much improved after steroid injection bilateral heel - Educated on icing and stretching. Instructions given.  -Further injection deferred - DME: Recommend gel heel cups for shoes - Pharmacologic management: NSAID as needed -Will consider further steroid injection in the future.  # Tinea pedis bilateral -Improving continue ketoconazole therapy Discussed the etiology and treatment options for tinea pedis.  Discussed topical and oral treatment.  Recommended topical treatment with 2% ketoconazole cream.  This was sent to the patient's pharmacy.  Also discussed appropriate foot hygiene, use of antifungal spray such as Tinactin in shoes, as well as cleaning her foot surfaces such as showers and bathroom floors with bleach.  #Onychomycosis with pain  -Nails palliatively debrided as below. -Educated on self-care  Procedure: Nail Debridement Rationale: Pain Type of Debridement: manual, sharp debridement. Instrumentation: Nail nipper, rotary burr. Number of Nails: 10  Return in about 3 months (around 06/27/2023) for [facility].

## 2023-03-29 ENCOUNTER — Other Ambulatory Visit: Payer: Self-pay | Admitting: Internal Medicine

## 2023-03-29 DIAGNOSIS — E119 Type 2 diabetes mellitus without complications: Secondary | ICD-10-CM

## 2023-04-01 ENCOUNTER — Other Ambulatory Visit: Payer: Self-pay | Admitting: Podiatry

## 2023-04-02 MED ORDER — KETOCONAZOLE 2 % EX CREA: 1.0000 | TOPICAL_CREAM | Freq: Every day | CUTANEOUS | 0 refills | Status: DC

## 2023-05-02 LAB — HM DIABETES EYE EXAM

## 2023-05-04 ENCOUNTER — Other Ambulatory Visit: Payer: Self-pay | Admitting: Internal Medicine

## 2023-05-04 DIAGNOSIS — E119 Type 2 diabetes mellitus without complications: Secondary | ICD-10-CM

## 2023-05-05 MED ORDER — TRULICITY 0.75 MG/0.5ML ~~LOC~~ SOAJ: 0.7500 mg | SUBCUTANEOUS | 0 refills | Status: DC

## 2023-05-27 ENCOUNTER — Encounter: Payer: Self-pay | Admitting: Internal Medicine

## 2023-05-27 ENCOUNTER — Ambulatory Visit: Payer: 59 | Admitting: Internal Medicine

## 2023-05-27 VITALS — BP 138/82 | HR 100 | Ht 69.0 in | Wt 230.4 lb

## 2023-05-27 DIAGNOSIS — E119 Type 2 diabetes mellitus without complications: Secondary | ICD-10-CM

## 2023-05-27 DIAGNOSIS — Z2821 Immunization not carried out because of patient refusal: Secondary | ICD-10-CM

## 2023-05-27 DIAGNOSIS — E785 Hyperlipidemia, unspecified: Secondary | ICD-10-CM

## 2023-05-27 DIAGNOSIS — E1169 Type 2 diabetes mellitus with other specified complication: Secondary | ICD-10-CM

## 2023-05-27 DIAGNOSIS — R1011 Right upper quadrant pain: Secondary | ICD-10-CM | POA: Diagnosis not present

## 2023-05-27 DIAGNOSIS — I10 Essential (primary) hypertension: Secondary | ICD-10-CM | POA: Diagnosis not present

## 2023-05-27 DIAGNOSIS — Z7984 Long term (current) use of oral hypoglycemic drugs: Secondary | ICD-10-CM | POA: Diagnosis not present

## 2023-05-27 MED ORDER — TRULICITY 1.5 MG/0.5ML ~~LOC~~ SOAJ
1.5000 mg | SUBCUTANEOUS | 0 refills | Status: AC
Start: 2023-05-27 — End: 2023-06-24

## 2023-05-27 NOTE — Progress Notes (Signed)
Established Patient Office Visit  Subjective   Patient ID: Douglas Dorsey, male    DOB: [birth year]  Age: 47 y.o. MRN: [id number]  Chief Complaint  Patient presents with   Hypertension    Three month follow up    Diabetes    Three month follow up    Abdominal Pain    Abdominal and side pain    Douglas Dorsey returns to care today for 27-month follow-up.  He was last evaluated by me on 7/22.  Lisinopril was increased to 10 mg daily at that time for improved treatment of hypertension.  Trulicity was also started in the setting of uncontrolled diabetes mellitus.  In the interim he has been evaluated by podiatry.  There have otherwise been no acute interval events.  [name] reports feeling fairly well today.  He endorses intermittent dull RUQ abdominal pain radiating to the right lower quadrant.  He describes 2 episodes that lasted for 1 week.  Symptoms most recently resolved on Sunday (10/20).  He does not have any additional concerns to discuss today.  Past Medical History:  Diagnosis Date   Barrett\'s esophagus 2016   Colon polyp 2015   Complication of anesthesia    pt sts "they told me it took longer for them to wake me up than it should have".   Diabetes mellitus without complication (HCC)    Family history of coronary artery disease 12/24/2011   GERD (gastroesophageal reflux disease)    Hypertension    Tobacco abuse    Past Surgical History:  Procedure Laterality Date   BIOPSY  10/01/2021   Procedure: BIOPSY;  Surgeon: Carver, Charles K, DO;  Location: [facility];  Service: Endoscopy;;   COLONOSCOPY N/A 01/20/2014   RMR: 2 small colonic polyps removed. Friable anal canal hemorrhoids.  Pathology with tubular adenoma and polypoid fragment of benign colonic mucosa.   COLONOSCOPY N/A 02/22/2015   Surgeon: Robert M Rourk, MD; normal exam.  Repeat in 10 years.   ESOPHAGOGASTRODUODENOSCOPY N/A 01/20/2014   RMR:Patulous EG junction abnoraml distal esophagus- query  short segment Barretts  -status post esophageal biospy. Hiatal hernia. Abnormal gastric mucosa-status post gastric biopsy.  Pathology with chronic gastritis, negative for H. pylori, Barrett\'s esophagus without dysplasia.   ESOPHAGOGASTRODUODENOSCOPY N/A 02/22/2015   Surgeon: Robert M Rourk, MD; Barrett\'s esophagus without dysplasia, hiatal hernia.  Repeat in 3 years.   ESOPHAGOGASTRODUODENOSCOPY (EGD) WITH PROPOFOL N/A 10/01/2021   Procedure: ESOPHAGOGASTRODUODENOSCOPY (EGD) WITH PROPOFOL;  Surgeon: Carver, Charles K, DO;  Location: [facility];  Service: Endoscopy;  Laterality: N/A;  2:00pm   HERNIA REPAIR     Umbilical   INCISIONAL HERNIA REPAIR N/A 12/17/2013   Procedure: LAPAROSCOPIC INCISIONAL HERNIA REPAIR WITH MESH;  Surgeon: Mark A Jenkins, MD;  Location: [facility];  Service: General;  Laterality: N/A;   INSERTION OF MESH N/A 12/17/2013   Procedure: INSERTION OF MESH;  Surgeon: Mark A Jenkins, MD;  Location: [facility];  Service: General;  Laterality: N/A;   MULTIPLE EXTRACTIONS WITH ALVEOLOPLASTY Bilateral 12/20/2016   Procedure: MULTIPLE EXTRACTION OF TEETH NUMBER ONE, THREE, SIX, SEVEN, EIGHT, NINE, TEN, ELEVEN, FOURTEEN, NINETEEN, TWENTY, TWENTY-ONE, TWENTY-TWO, TWENTY-THREE, TWENTY-FOUR, TWENTY- FIVE, TWENTY-SIX, TWENTY-SEVEN, TWENTY-EIGHT, TWENTY-NINE, THIRTY  ;   ALVEOLOPLASTY; BIOPSY LESION OF UVULA;  Surgeon: Jensen, Scott, DDS;  Location: [facility];  Service: Oral Surgery;  Laterality: Bilateral;   MULTIPLE TOOTH EXTRACTIONS     teeth extracted     12  teeth removed in [place]   Social History  Tobacco Use   Smoking status: Every Day    Current packs/day: 2.00    Average packs/day: 2.0 packs/day for 30.0 years (60.0 ttl pk-yrs)    Types: Cigarettes   Smokeless tobacco: Never  Vaping Use   Vaping status: Some Days   Last attempt to quit: 08/24/2017  Substance Use Topics   Alcohol use: No   Drug use: No   Family History  Problem Relation Age of Onset   Diabetes Mother    Hypertension Mother     Heart disease Mother    Lung disease Mother    Heart failure Father    Heart attack Father    Heart disease Sister    Mental illness Sister    Seizures Sister    Heart attack Brother    Hypertension Brother    Heart disease Brother    Heart disease Brother    Mental illness Brother    Heart attack Maternal Grandmother    Diabetes Maternal Grandmother    Pneumonia Maternal Grandfather    COPD Maternal Grandfather    Emphysema Maternal Grandfather    Lung disease Maternal Grandfather    Colon cancer Neg Hx    Inflammatory bowel disease Neg Hx    Cancer - Colon Neg Hx    No Known Allergies  Review of Systems  Constitutional:  Negative for chills and fever.  HENT:  Negative for sore throat.   Respiratory:  Negative for cough and shortness of breath.   Cardiovascular:  Negative for chest pain, palpitations and leg swelling.  Gastrointestinal:  Positive for abdominal pain (Dull, intermittent right sided abdominal pain). Negative for blood in stool, constipation, diarrhea, nausea and vomiting.  Genitourinary:  Negative for dysuria and hematuria.  Musculoskeletal:  Negative for myalgias.  Skin:  Negative for itching and rash.  Neurological:  Negative for dizziness and headaches.  Psychiatric/Behavioral:  Negative for depression and suicidal ideas.      Objective:     BP 138/82   Pulse 100   Ht 5\' 9"  (1.753 m)   Wt 230 lb 6.4 oz (104.5 kg)   SpO2 95%   BMI 34.02 kg/m  BP Readings from Last 3 Encounters:  05/27/23 138/82  02/24/23 138/87  01/27/23 121/78   Physical Exam Vitals reviewed.  Constitutional:      General: [name] not in acute distress.    Appearance: Normal appearance. [name] obese. [name] not ill-appearing.  HENT:     Head: Normocephalic and atraumatic.     Right Ear: External ear normal.     Left Ear: External ear normal.     Nose: Nose normal. No congestion or rhinorrhea.     Mouth/Throat:     Mouth: Mucous membranes are moist.     Pharynx: Oropharynx  is clear.  Eyes:     General: No scleral icterus.    Extraocular Movements: Extraocular movements intact.     Conjunctiva/sclera: Conjunctivae normal.     Pupils: Pupils are equal, round, and reactive to light.  Cardiovascular:     Rate and Rhythm: Normal rate and regular rhythm.     Pulses: Normal pulses.     Heart sounds: Normal heart sounds. No murmur heard. Pulmonary:     Effort: Pulmonary effort is normal.     Breath sounds: Normal breath sounds. No wheezing, rhonchi or rales.  Abdominal:     General: Abdomen is flat. Bowel sounds are normal. There is no distension.     Palpations: Abdomen is  soft.     Tenderness: There is no abdominal tenderness.  Musculoskeletal:        General: No swelling or deformity. Normal range of motion.     Cervical back: Normal range of motion.  Skin:    General: Skin is warm and dry.     Capillary Refill: Capillary refill takes less than 2 seconds.  Neurological:     General: No focal deficit present.     Mental Status: [name] alert and oriented to person, place, and time.     Motor: No weakness.  Psychiatric:        Mood and Affect: Mood normal.        Behavior: Behavior normal.        Thought Content: Thought content normal.   Last CBC Lab Results  Component Value Date   WBC 8.7 11/29/2022   HGB 17.0 11/29/2022   HCT 51.8 (H) 11/29/2022   MCV 86 11/29/2022   MCH 28.1 11/29/2022   RDW 13.2 11/29/2022   PLT 239 11/29/2022   Last metabolic panel Lab Results  Component Value Date   GLUCOSE 233 (H) 02/24/2023   NA 136 02/24/2023   K 5.2 02/24/2023   CL 95 (L) 02/24/2023   CO2 27 02/24/2023   BUN 11 02/24/2023   CREATININE 0.87 02/24/2023   EGFR 109 02/24/2023   CALCIUM 9.9 02/24/2023   PROT 7.3 01/27/2023   ALBUMIN 4.2 01/27/2023   LABGLOB 3.1 01/27/2023   AGRATIO 1.5 12/27/2022   BILITOT 0.3 01/27/2023   ALKPHOS 122 (H) 01/27/2023   AST 16 01/27/2023   ALT 16 01/27/2023   ANIONGAP 7 09/27/2021   Last lipids Lab Results   Component Value Date   CHOL 177 02/24/2023   HDL 36 (L) 02/24/2023   LDLCALC 114 (H) 02/24/2023   TRIG 151 (H) 02/24/2023   CHOLHDL 4.9 02/24/2023   Last hemoglobin A1c Lab Results  Component Value Date   HGBA1C 9.6 (H) 11/29/2022   Last thyroid functions Lab Results  Component Value Date   TSH 1.550 11/29/2022   Last vitamin D Lab Results  Component Value Date   VD25OH 15.6 (L) 11/29/2022   Last vitamin B12 and Folate Lab Results  Component Value Date   VITAMINB12 623 11/29/2022   FOLATE 3.8 11/29/2022   The 10-year ASCVD risk score (Arnett DK, et al., 2019) is: 15.3%    Assessment & Plan:   Problem List Items Addressed This Visit       Essential hypertension    Adequately controlled on lisinopril 10 mg daily.  No medication changes are indicated today.      Type 2 diabetes mellitus without complications (HCC)    A1c 9.6 on labs from April.  [name] currently prescribed metformin XR 1000 mg twice daily and Trulicity 0.75 mg weekly.  He checks his sugar regularly and reports readings 200-300. -Increase Trulicity 1.5 mg weekly.  We will plan to titrate up to 4.5 mg weekly over the next 8 weeks. -Repeat A1c at follow-up in 3 months      Hyperlipidemia associated with type 2 diabetes mellitus (HCC)    Lipid panel updated in July.  Total cholesterol 177 and LDL 114.  LDL improved from 198 previously.  [name] currently prescribed atorvastatin 40 mg daily. -Repeat lipid panel at follow-up in 3 months      Colicky RUQ abdominal pain - Primary    He describes colicky RUQ abdominal pain.  No significant tenderness palpation is  present on exam today.  He has experienced 2 episodes lasting 1 week each.  Symptoms most recently resolved on Sunday (10/20).  He noted that symptoms were worsened with eating and alleviated with standing and walking around. -RUQ U/S ordered today.      Return in about 3 months (around 08/27/2023).   Billie Lade, MD

## 2023-05-27 NOTE — Assessment & Plan Note (Signed)
Lipid panel updated in July.  Total cholesterol 177 and LDL 114.  LDL improved from 198 previously.  [name] currently prescribed atorvastatin 40 mg daily. -Repeat lipid panel at follow-up in 3 months

## 2023-05-27 NOTE — Assessment & Plan Note (Signed)
A1c 9.6 on labs from April.  [name] currently prescribed metformin XR 1000 mg twice daily and Trulicity 0.75 mg weekly.  He checks his sugar regularly and reports readings 200-300. -Increase Trulicity 1.5 mg weekly.  We will plan to titrate up to 4.5 mg weekly over the next 8 weeks. -Repeat A1c at follow-up in 3 months

## 2023-05-27 NOTE — Assessment & Plan Note (Signed)
He describes colicky RUQ abdominal pain.  No significant tenderness palpation is present on exam today.  He has experienced 2 episodes lasting 1 week each.  Symptoms most recently resolved on Sunday (10/20).  He noted that symptoms were worsened with eating and alleviated with standing and walking around. -RUQ U/S ordered today.

## 2023-05-27 NOTE — Assessment & Plan Note (Signed)
Adequately controlled on lisinopril 10 mg daily.  No medication changes are indicated today.

## 2023-05-27 NOTE — Patient Instructions (Signed)
It was a pleasure to see you today.  Thank you for giving [place] the opportunity to be involved in your care.  Below is a brief recap of your visit and next steps.  We will plan to see you again in 3 months.  Summary RUQ ultrasound ordered today Increase Trulicity to 1.5 mg weekly, plan to increase to 3 mg after 4 injections Follow up in 3 months and repeat labs at that time.

## 2023-06-01 ENCOUNTER — Other Ambulatory Visit: Payer: Self-pay | Admitting: Internal Medicine

## 2023-06-01 DIAGNOSIS — E1165 Type 2 diabetes mellitus with hyperglycemia: Secondary | ICD-10-CM

## 2023-06-03 ENCOUNTER — Ambulatory Visit (HOSPITAL_COMMUNITY)
Admission: RE | Admit: 2023-06-03 | Discharge: 2023-06-03 | Disposition: A | Discharge status: Home / Self Care | Payer: 59 | Source: Ambulatory Visit | Attending: Internal Medicine | Admitting: Internal Medicine

## 2023-06-03 DIAGNOSIS — R1011 Right upper quadrant pain: Secondary | ICD-10-CM | POA: Diagnosis not present

## 2023-06-03 DIAGNOSIS — R110 Nausea: Secondary | ICD-10-CM | POA: Diagnosis not present

## 2023-06-27 ENCOUNTER — Ambulatory Visit: Payer: 59 | Admitting: Podiatry

## 2023-06-29 ENCOUNTER — Encounter: Payer: Self-pay | Admitting: Internal Medicine

## 2023-06-29 DIAGNOSIS — E119 Type 2 diabetes mellitus without complications: Secondary | ICD-10-CM

## 2023-06-30 MED ORDER — TRULICITY 3 MG/0.5ML ~~LOC~~ SOAJ
3.0000 mg | SUBCUTANEOUS | 0 refills | Status: AC
Start: 2023-06-30 — End: 2023-07-28

## 2023-07-06 IMAGING — DX DG CHEST 1V PORT
1 series · 1 of 1 positions shown · non-contrast
Comparison: None.

CLINICAL DATA: Pain, shortness of breath, cough

EXAM:
PORTABLE CHEST 1 VIEW

[chest ap grid]
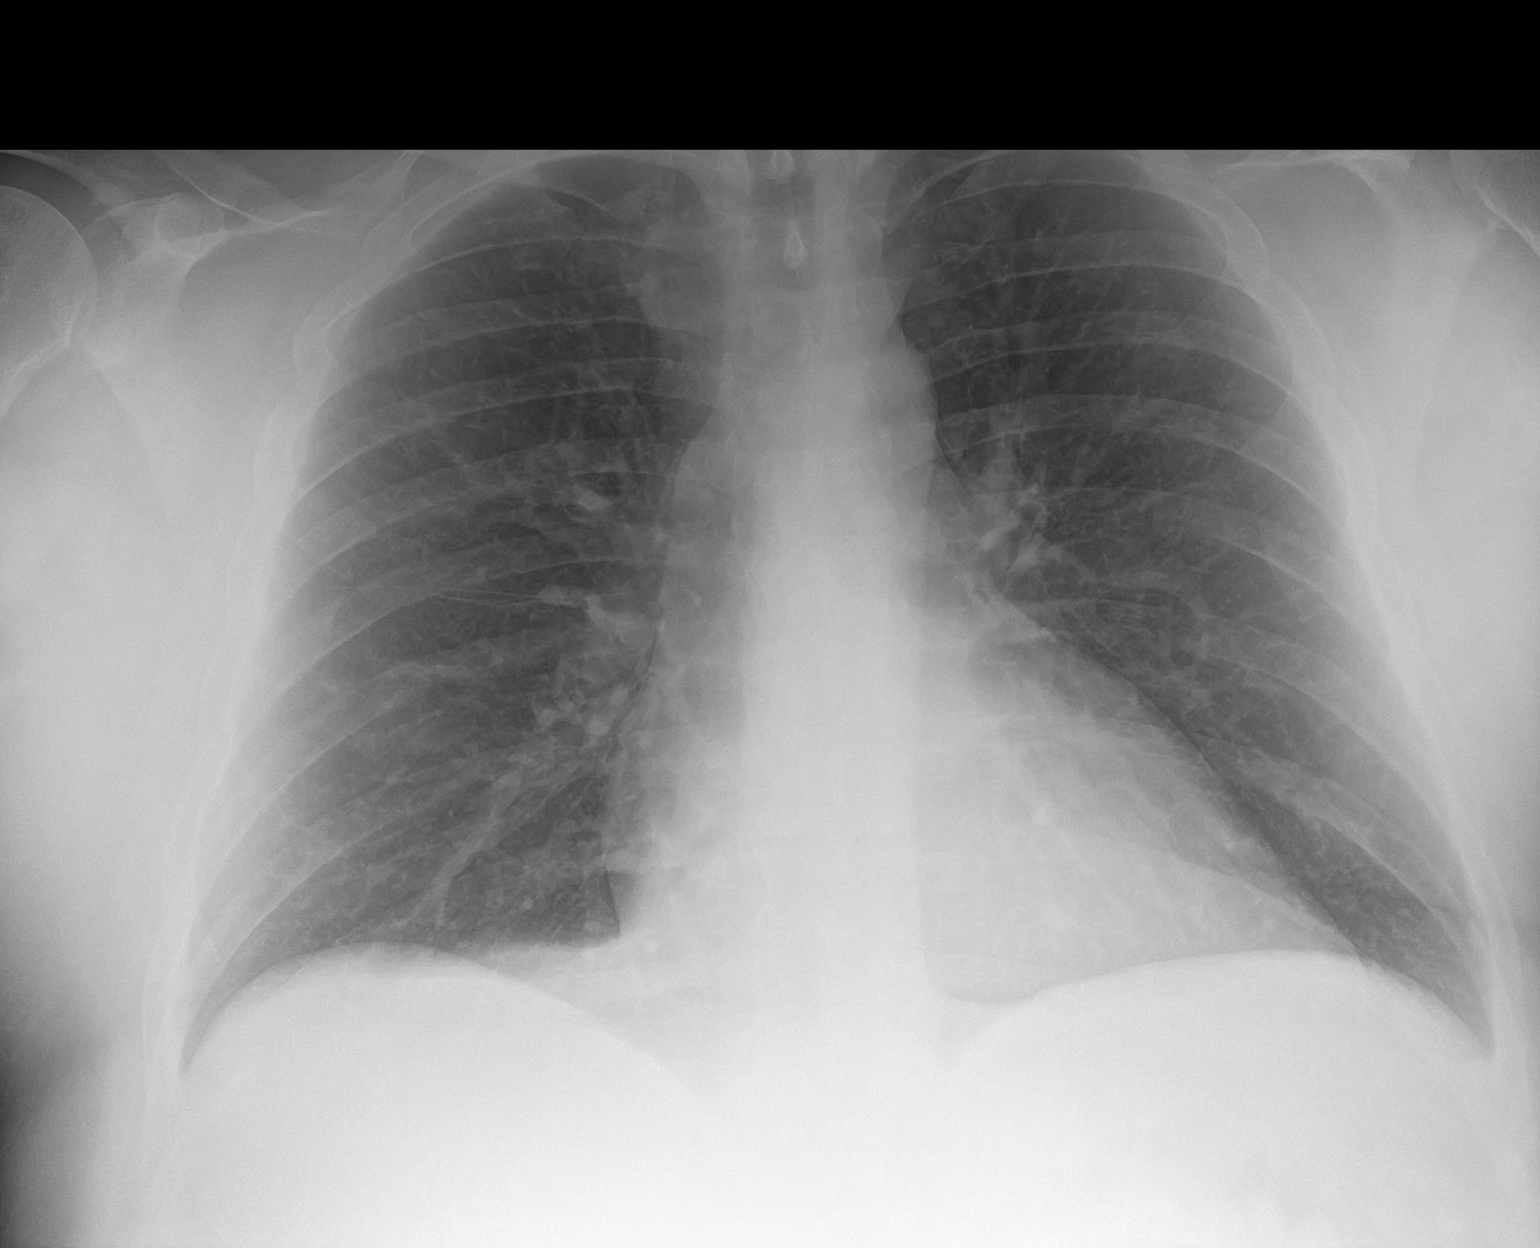

[1 of 1 positions shown; findings below may reference images not displayed]

FINDINGS: Lungs are clear.  No pleural effusion or pneumothorax.

The heart is normal in size.
IMPRESSION: No evidence of acute cardiopulmonary disease.

## 2023-08-06 ENCOUNTER — Encounter: Payer: Self-pay | Admitting: Internal Medicine

## 2023-08-06 DIAGNOSIS — E119 Type 2 diabetes mellitus without complications: Secondary | ICD-10-CM

## 2023-08-07 MED ORDER — TRULICITY 4.5 MG/0.5ML ~~LOC~~ SOAJ: 4.5000 mg | SUBCUTANEOUS | 0 refills | Status: DC

## 2023-08-11 ENCOUNTER — Other Ambulatory Visit: Payer: Self-pay | Admitting: Internal Medicine

## 2023-08-11 DIAGNOSIS — E119 Type 2 diabetes mellitus without complications: Secondary | ICD-10-CM

## 2023-08-14 ENCOUNTER — Encounter: Payer: Self-pay | Admitting: Internal Medicine

## 2023-08-18 ENCOUNTER — Other Ambulatory Visit: Payer: Self-pay | Admitting: Internal Medicine

## 2023-08-18 DIAGNOSIS — K625 Hemorrhage of anus and rectum: Secondary | ICD-10-CM

## 2023-08-28 ENCOUNTER — Ambulatory Visit: Payer: 59 | Admitting: Internal Medicine

## 2023-08-28 VITALS — BP 136/89 | HR 115 | Ht 69.0 in | Wt 222.8 lb

## 2023-08-28 DIAGNOSIS — E119 Type 2 diabetes mellitus without complications: Secondary | ICD-10-CM

## 2023-08-28 DIAGNOSIS — E1169 Type 2 diabetes mellitus with other specified complication: Secondary | ICD-10-CM | POA: Diagnosis not present

## 2023-08-28 DIAGNOSIS — I10 Essential (primary) hypertension: Secondary | ICD-10-CM

## 2023-08-28 DIAGNOSIS — K2270 Barrett's esophagus without dysplasia: Secondary | ICD-10-CM

## 2023-08-28 DIAGNOSIS — E785 Hyperlipidemia, unspecified: Secondary | ICD-10-CM | POA: Diagnosis not present

## 2023-08-28 DIAGNOSIS — K219 Gastro-esophageal reflux disease without esophagitis: Secondary | ICD-10-CM | POA: Diagnosis not present

## 2023-08-28 DIAGNOSIS — R110 Nausea: Secondary | ICD-10-CM | POA: Diagnosis not present

## 2023-08-28 DIAGNOSIS — E1165 Type 2 diabetes mellitus with hyperglycemia: Secondary | ICD-10-CM | POA: Diagnosis not present

## 2023-08-28 DIAGNOSIS — E559 Vitamin D deficiency, unspecified: Secondary | ICD-10-CM | POA: Diagnosis not present

## 2023-08-28 DIAGNOSIS — Z7984 Long term (current) use of oral hypoglycemic drugs: Secondary | ICD-10-CM

## 2023-08-28 MED ORDER — ROSUVASTATIN CALCIUM 20 MG PO TABS: 20.0000 mg | ORAL_TABLET | Freq: Every day | ORAL | 3 refills | Status: DC

## 2023-08-28 MED ORDER — METFORMIN HCL ER 500 MG PO TB24: 1000.0000 mg | ORAL_TABLET | Freq: Every day | ORAL | 3 refills | Status: DC

## 2023-08-28 MED ORDER — ONDANSETRON HCL 4 MG PO TABS: 4.0000 mg | ORAL_TABLET | Freq: Three times a day (TID) | ORAL | 1 refills | Status: DC | PRN

## 2023-08-28 MED ORDER — OMEPRAZOLE 40 MG PO CPDR: 40.0000 mg | DELAYED_RELEASE_CAPSULE | Freq: Every day | ORAL | 3 refills | Status: DC

## 2023-08-28 NOTE — Patient Instructions (Signed)
It was a pleasure to see you today.  Thank you for giving [place] the opportunity to be involved in your care.  Below is a brief recap of your visit and next steps.  We will plan to see you again in 3 months.  Summary Switch atorvastatin to rosuvastatin 20 mg daily Repeat labs ordered Follow up in 3 months

## 2023-08-28 NOTE — Assessment & Plan Note (Signed)
Noted on previous labs.  He has completed high-dose, weekly vitamin D supplementation. -Repeat vitamin D level ordered today 

## 2023-08-28 NOTE — Assessment & Plan Note (Signed)
Symptoms remain adequately controlled with omeprazole 40 mg daily.  Refill provided today.

## 2023-08-28 NOTE — Assessment & Plan Note (Signed)
 Remains adequately controlled on current antihypertensive regimen.  No medication changes are indicated today.

## 2023-08-28 NOTE — Progress Notes (Signed)
Established Patient Office Visit  Subjective   Patient ID: Douglas Dorsey, male    DOB: 02/24/1976  Age: 48 y.o. MRN: [id number]  Chief Complaint  Patient presents with   Diabetes    Three month follow up , patient states taking trulicity makes him feel awful, sick on his stomach, no appetite   [name] returns to care today for routine follow-up.  He was last evaluated by me in October 2024.  Trulicity was increased at that time.  He additionally endorsed colicky right upper quadrant abdominal pain.  A right upper quadrant ultrasound was ordered and did not show any acute findings but did show changes consistent with hepatic steatosis. There have been no acute interval events. [name] reports feeling fairly well today.  He endorses nausea and loss of appetite after recently increasing Trulicity to 4.5 mg weekly.  He has completed one injection and states that it has made him feel poorly.  [name] interested in medications for as needed symptom relief.  He does not have any additional concerns to discuss today.  Past Medical History:  Diagnosis Date   Barrett's esophagus 2016   Colon polyp 2015   Complication of anesthesia    pt sts "they told me it took longer for them to wake me up than it should have".   Diabetes mellitus without complication (HCC)    Family history of coronary artery disease 12/24/2011   GERD (gastroesophageal reflux disease)    Hypertension    Tobacco abuse    Past Surgical History:  Procedure Laterality Date   BIOPSY  10/01/2021   Procedure: BIOPSY;  Surgeon: Lanelle Bal, DO;  Location: [facility];  Service: Endoscopy;;   COLONOSCOPY N/A 01/20/2014   RMR: 2 small colonic polyps removed. Friable anal canal hemorrhoids.  Pathology with tubular adenoma and polypoid fragment of benign colonic mucosa.   COLONOSCOPY N/A 02/22/2015   Surgeon: Corbin Ade, MD; normal exam.  Repeat in 10 years.   ESOPHAGOGASTRODUODENOSCOPY N/A 01/20/2014   NWG:[id number] EG  junction abnoraml distal esophagus- query  short segment Barretts -status post esophageal biospy. Hiatal hernia. Abnormal gastric mucosa-status post gastric biopsy.  Pathology with chronic gastritis, negative for H. pylori, Barrett's esophagus without dysplasia.   ESOPHAGOGASTRODUODENOSCOPY N/A 02/22/2015   Surgeon: Corbin Ade, MD; Barrett's esophagus without dysplasia, hiatal hernia.  Repeat in 3 years.   ESOPHAGOGASTRODUODENOSCOPY (EGD) WITH PROPOFOL N/A 10/01/2021   Procedure: ESOPHAGOGASTRODUODENOSCOPY (EGD) WITH PROPOFOL;  Surgeon: Lanelle Bal, DO;  Location: [facility];  Service: Endoscopy;  Laterality: N/A;  2:00pm   HERNIA REPAIR     Umbilical   INCISIONAL HERNIA REPAIR N/A 12/17/2013   Procedure: LAPAROSCOPIC INCISIONAL HERNIA REPAIR WITH MESH;  Surgeon: Dalia Heading, MD;  Location: [facility];  Service: General;  Laterality: N/A;   INSERTION OF MESH N/A 12/17/2013   Procedure: INSERTION OF MESH;  Surgeon: Dalia Heading, MD;  Location: [facility];  Service: General;  Laterality: N/A;   MULTIPLE EXTRACTIONS WITH ALVEOLOPLASTY Bilateral 12/20/2016   Procedure: MULTIPLE EXTRACTION OF TEETH NUMBER ONE, THREE, SIX, SEVEN, EIGHT, NINE, TEN, ELEVEN, FOURTEEN, NINETEEN, TWENTY, TWENTY-ONE, TWENTY-TWO, TWENTY-THREE, TWENTY-FOUR, TWENTY- FIVE, TWENTY-SIX, TWENTY-SEVEN, TWENTY-EIGHT, TWENTY-NINE, THIRTY  ;   ALVEOLOPLASTY; BIOPSY LESION OF UVULA;  Surgeon: Ocie Doyne, DDS;  Location: [facility];  Service: Oral Surgery;  Laterality: Bilateral;   MULTIPLE TOOTH EXTRACTIONS     teeth extracted     12 teeth removed in [place]   Social History  Tobacco Use   Smoking status: Every Day    Current packs/day: 2.00    Average packs/day: 2.0 packs/day for 30.0 years (60.0 ttl pk-yrs)    Types: Cigarettes   Smokeless tobacco: Never  Vaping Use   Vaping status: Some Days   Last attempt to quit: 08/24/2017  Substance Use Topics   Alcohol use: No   Drug use: No   Family History  Problem  Relation Age of Onset   Diabetes Mother    Hypertension Mother    Heart disease Mother    Lung disease Mother    Heart failure Father    Heart attack Father    Heart disease Sister    Mental illness Sister    Seizures Sister    Heart attack Brother    Hypertension Brother    Heart disease Brother    Heart disease Brother    Mental illness Brother    Heart attack Maternal Grandmother    Diabetes Maternal Grandmother    Pneumonia Maternal Grandfather    COPD Maternal Grandfather    Emphysema Maternal Grandfather    Lung disease Maternal Grandfather    Colon cancer Neg Hx    Inflammatory bowel disease Neg Hx    Cancer - Colon Neg Hx    No Known Allergies  Review of Systems  Constitutional:  Negative for chills and fever.  HENT:  Negative for sore throat.   Respiratory:  Negative for cough and shortness of breath.   Cardiovascular:  Negative for chest pain, palpitations and leg swelling.  Gastrointestinal:  Positive for abdominal pain and nausea. Negative for blood in stool, constipation, diarrhea and vomiting.  Genitourinary:  Negative for dysuria and hematuria.  Musculoskeletal:  Negative for myalgias.  Skin:  Negative for itching and rash.  Neurological:  Negative for dizziness and headaches.  Psychiatric/Behavioral:  Negative for depression and suicidal ideas.      Objective:     BP 136/89 (BP Location: Left Arm, Patient Position: Sitting, Cuff Size: Normal)   Pulse (!) 115   Ht 5\' 9"  (1.753 m)   Wt 222 lb 12.8 oz (101.1 kg)   SpO2 94%   BMI 32.90 kg/m  BP Readings from Last 3 Encounters:  08/28/23 136/89  05/27/23 138/82  02/24/23 138/87   Physical Exam Vitals reviewed.  Constitutional:      General: [name] not in acute distress.    Appearance: Normal appearance. [name] obese. [name] not ill-appearing.  HENT:     Head: Normocephalic and atraumatic.     Right Ear: External ear normal.     Left Ear: External ear normal.     Nose: Nose normal. No congestion  or rhinorrhea.     Mouth/Throat:     Mouth: Mucous membranes are moist.     Pharynx: Oropharynx is clear.  Eyes:     General: No scleral icterus.    Extraocular Movements: Extraocular movements intact.     Conjunctiva/sclera: Conjunctivae normal.     Pupils: Pupils are equal, round, and reactive to light.  Cardiovascular:     Rate and Rhythm: Normal rate and regular rhythm.     Pulses: Normal pulses.     Heart sounds: Normal heart sounds. No murmur heard. Pulmonary:     Effort: Pulmonary effort is normal.     Breath sounds: Normal breath sounds. No wheezing, rhonchi or rales.  Abdominal:     General: Abdomen is flat. Bowel sounds are normal. There is no distension.  Palpations: Abdomen is soft.     Tenderness: There is no abdominal tenderness.  Musculoskeletal:        General: No swelling or deformity. Normal range of motion.     Cervical back: Normal range of motion.  Skin:    General: Skin is warm and dry.     Capillary Refill: Capillary refill takes less than 2 seconds.  Neurological:     General: No focal deficit present.     Mental Status: [name] alert and oriented to person, place, and time.     Motor: No weakness.  Psychiatric:        Mood and Affect: Mood normal.        Behavior: Behavior normal.        Thought Content: Thought content normal.   Last CBC Lab Results  Component Value Date   WBC 8.7 11/29/2022   HGB 17.0 11/29/2022   HCT 51.8 (H) 11/29/2022   MCV 86 11/29/2022   MCH 28.1 11/29/2022   RDW 13.2 11/29/2022   PLT 239 11/29/2022   Last metabolic panel Lab Results  Component Value Date   GLUCOSE 233 (H) 02/24/2023   NA 136 02/24/2023   K 5.2 02/24/2023   CL 95 (L) 02/24/2023   CO2 27 02/24/2023   BUN 11 02/24/2023   CREATININE 0.87 02/24/2023   EGFR 109 02/24/2023   CALCIUM 9.9 02/24/2023   PROT 7.3 01/27/2023   ALBUMIN 4.2 01/27/2023   LABGLOB 3.1 01/27/2023   AGRATIO 1.5 12/27/2022   BILITOT 0.3 01/27/2023   ALKPHOS 122 (H)  01/27/2023   AST 16 01/27/2023   ALT 16 01/27/2023   ANIONGAP 7 09/27/2021   Last lipids Lab Results  Component Value Date   CHOL 177 02/24/2023   HDL 36 (L) 02/24/2023   LDLCALC 114 (H) 02/24/2023   TRIG 151 (H) 02/24/2023   CHOLHDL 4.9 02/24/2023   Last hemoglobin A1c Lab Results  Component Value Date   HGBA1C 9.6 (H) 11/29/2022   Last thyroid functions Lab Results  Component Value Date   TSH 1.550 11/29/2022   Last vitamin D Lab Results  Component Value Date   VD25OH 15.6 (L) 11/29/2022   Last vitamin B12 and Folate Lab Results  Component Value Date   VITAMINB12 623 11/29/2022   FOLATE 3.8 11/29/2022   The 10-year ASCVD risk score (Arnett DK, et al., 2019) is: 15.9%    Assessment & Plan:   Problem List Items Addressed This Visit       Essential hypertension   Remains adequately controlled on current antihypertensive regimen.  No medication changes are indicated today.      Gastroesophageal reflux disease   Symptoms remain adequately controlled with omeprazole 40 mg daily.  Refill provided today.      Type 2 diabetes mellitus without complications (HCC)   A1c 9.6 in April 2024.  [name] currently prescribed metformin XR 1000 mg twice daily and Trulicity 4.5 mg weekly.  Today he endorses nausea after increasing Trulicity to 4.5 mg.  He has completed one injection and feels poorly at the moment.  He endorses similar side effects when increasing Trulicity doses previously and states that he was able to adjust to the symptoms.  [name] interested in an as needed medication for symptom relief.  He also reports that he stopped taking the evening dose of metformin because it was making him feel poorly. -Repeat A1c ordered today -Continue Trulicity as currently prescribed.  Okay with taking metformin 1000  mg once daily.  Can consider increasing frequency back to twice daily and reducing dose of Trulicity if current symptoms are intolerable. -Add Zofran for as needed  nausea relief      Hyperlipidemia associated with type 2 diabetes mellitus (HCC) - Primary   Lipid panel updated in July 2024.  [name] currently prescribed atorvastatin 40 mg daily but reports not taking it recently due to perceived side effects.  Through shared decision making, atorvastatin has been discontinued in favor of rosuvastatin 20 mg daily.  Repeat lipid panel at follow-up in 3 months.      Vitamin D deficiency   Noted on previous labs.  He has completed high-dose, weekly vitamin D supplementation.  Repeat vitamin D level ordered today      Return in about 3 months (around 11/26/2023).   Billie Lade, MD

## 2023-08-28 NOTE — Assessment & Plan Note (Signed)
A1c 9.6 in April 2024.  [name] currently prescribed metformin XR 1000 mg twice daily and Trulicity 4.5 mg weekly.  Today he endorses nausea after increasing Trulicity to 4.5 mg.  He has completed one injection and feels poorly at the moment.  He endorses similar side effects when increasing Trulicity doses previously and states that he was able to adjust to the symptoms.  [name] interested in an as needed medication for symptom relief.  He also reports that he stopped taking the evening dose of metformin because it was making him feel poorly. -Repeat A1c ordered today -Continue Trulicity as currently prescribed.  Okay with taking metformin 1000 mg once daily.  Can consider increasing frequency back to twice daily and reducing dose of Trulicity if current symptoms are intolerable. -Add Zofran for as needed nausea relief

## 2023-08-28 NOTE — Assessment & Plan Note (Signed)
Lipid panel updated in July 2024.  [name] currently prescribed atorvastatin 40 mg daily but reports not taking it recently due to perceived side effects.  Through shared decision making, atorvastatin has been discontinued in favor of rosuvastatin 20 mg daily.  Repeat lipid panel at follow-up in 3 months.

## 2023-08-29 ENCOUNTER — Encounter: Payer: Self-pay | Admitting: Internal Medicine

## 2023-08-29 LAB — HEMOGLOBIN A1C
Est. average glucose Bld gHb Est-mCnc: 146 mg/dL
Hgb A1c MFr Bld: 6.7 % — ABNORMAL HIGH (ref 4.8–5.6)

## 2023-08-29 LAB — VITAMIN D 25 HYDROXY (VIT D DEFICIENCY, FRACTURES): Vit D, 25-Hydroxy: 21.4 ng/mL — ABNORMAL LOW (ref 30.0–100.0)

## 2023-09-25 ENCOUNTER — Encounter: Payer: Self-pay | Admitting: Internal Medicine

## 2023-09-26 ENCOUNTER — Encounter: Payer: Self-pay | Admitting: Internal Medicine

## 2023-09-26 ENCOUNTER — Telehealth: Payer: 59 | Admitting: Internal Medicine

## 2023-09-26 DIAGNOSIS — U071 COVID-19: Secondary | ICD-10-CM

## 2023-09-26 MED ORDER — NIRMATRELVIR/RITONAVIR (PAXLOVID)TABLET: 3.0000 | ORAL_TABLET | Freq: Two times a day (BID) | ORAL | 0 refills | Status: AC

## 2023-09-26 NOTE — Assessment & Plan Note (Signed)
 Evaluated today for an acute visit through video encounter in the setting of COVID-19 as noted above.  Symptom onset 2/17.  COVID-positive 2/20.  Nontoxic in appearance through video today. -Treatment options reviewed.  Paxlovid x 5 days prescribed for treatment as he is within 5 days of symptom onset and carries comorbid conditions of type 2 diabetes mellitus and obesity.  Additional supportive care measures were reviewed.  Recommend as needed use of over-the-counter cough and cold medications, aggressive hydration, and rest.  He was instructed to isolate through day 10 from symptom onset as long as he has been afebrile x 24 hours.  We discussed reasons for presenting to the emergency department over the weekend such as acute worsening of shortness of breath, acute onset chest pain, or if he begins to feel worse in general.  Otherwise, he will return to care for previously scheduled follow-up with me on 4/24.

## 2023-09-26 NOTE — Progress Notes (Signed)
 Virtual Visit via Video Note  I connected with Douglas Dorsey on 09/26/23 at  4:40 PM EST by a video enabled telemedicine application and verified that I am speaking with the correct person using two identifiers.  Patient Location: Home Provider Location: Office/Clinic  I discussed the limitations, risks, security, and privacy concerns of performing an evaluation and management service by video and the availability of in person appointments. I also discussed with the patient that there may be a patient responsible charge related to this service. The patient expressed understanding and agreed to proceed.  Subjective: PCP: Billie Lade, MD  Chief Complaint  Patient presents with   Covid Positive   Douglas Dorsey has been evaluated for an acute visit through video encounter after testing positive for COVID-19 yesterday (2/20).  He endorses onset of symptoms on Monday (2/17).  He reports myalgias, cough with scant sputum production, fatigue, and chills but no fever.  His wife is sick as well.  Denies shortness of breath, nausea/vomiting, diarrhea, and headache.  He has not tried taking any medications for symptom relief.   ROS: Per HPI  Current Outpatient Medications:    nirmatrelvir/ritonavir (PAXLOVID) 20 x 150 MG & 10 x 100MG  TABS, Take 3 tablets by mouth 2 (two) times daily for 5 days. (Take nirmatrelvir 150 mg two tablets twice daily for 5 days and ritonavir 100 mg one tablet twice daily for 5 days) Patient GFR is 109, Disp: 30 tablet, Rfl: 0   albuterol (VENTOLIN HFA) 108 (90 Base) MCG/ACT inhaler, Inhale 2 puffs into the lungs every 6 (six) hours as needed for wheezing or shortness of breath., Disp: , Rfl:    Dulaglutide (TRULICITY) 4.5 MG/0.5ML SOAJ, Inject 4.5 mg as directed once a week., Disp: 6 mL, Rfl: 0   hydrocortisone (ANUSOL-HC) 2.5 % rectal cream, APPLY RECTALLY TO AFFECTED AREA TWICE DAILY FOR 7-10 DAYS., Disp: 30 g, Rfl: 1   ketoconazole (NIZORAL) 2 % cream, Apply 1  Application topically daily., Disp: 60 g, Rfl: 0   lisinopril (ZESTRIL) 10 MG tablet, Take 1 tablet (10 mg total) by mouth daily., Disp: 90 tablet, Rfl: 3   metFORMIN (GLUCOPHAGE-XR) 500 MG 24 hr tablet, Take 2 tablets (1,000 mg total) by mouth daily with breakfast., Disp: 180 tablet, Rfl: 3   omeprazole (PRILOSEC) 40 MG capsule, Take 1 capsule (40 mg total) by mouth daily., Disp: 90 capsule, Rfl: 3   ondansetron (ZOFRAN) 4 MG tablet, Take 1 tablet (4 mg total) by mouth every 8 (eight) hours as needed for nausea or vomiting., Disp: 20 tablet, Rfl: 1   rosuvastatin (CRESTOR) 20 MG tablet, Take 1 tablet (20 mg total) by mouth daily., Disp: 90 tablet, Rfl: 3  Assessment and Plan:  COVID-19 Assessment & Plan: Evaluated today for an acute visit through video encounter in the setting of COVID-19 as noted above.  Symptom onset 2/17.  COVID-positive 2/20.  Nontoxic in appearance through video today. -Treatment options reviewed.  Paxlovid x 5 days prescribed for treatment as he is within 5 days of symptom onset and carries comorbid conditions of type 2 diabetes mellitus and obesity.  Additional supportive care measures were reviewed.  Recommend as needed use of over-the-counter cough and cold medications, aggressive hydration, and rest.  He was instructed to isolate through day 10 from symptom onset as long as he has been afebrile x 24 hours.  We discussed reasons for presenting to the emergency department over the weekend such as acute worsening of shortness of  breath, acute onset chest pain, or if he begins to feel worse in general.  Otherwise, he will return to care for previously scheduled follow-up with me on 4/24.  Follow Up Instructions: Return if symptoms worsen or fail to improve.   I discussed the assessment and treatment plan with the patient. The patient was provided an opportunity to ask questions, and all were answered. The patient agreed with the plan and demonstrated an understanding of the  instructions.   The patient was advised to call back or seek an in-person evaluation if the symptoms worsen or if the condition fails to improve as anticipated.  The above assessment and management plan was discussed with the patient. The patient verbalized understanding of and has agreed to the management plan.   Billie Lade, MD

## 2023-11-27 ENCOUNTER — Ambulatory Visit: Payer: 59 | Admitting: Internal Medicine

## 2023-12-25 ENCOUNTER — Other Ambulatory Visit: Payer: Self-pay | Admitting: Internal Medicine

## 2023-12-25 DIAGNOSIS — E119 Type 2 diabetes mellitus without complications: Secondary | ICD-10-CM

## 2024-01-01 ENCOUNTER — Ambulatory Visit: Admitting: Internal Medicine

## 2024-01-01 ENCOUNTER — Encounter: Payer: Self-pay | Admitting: Internal Medicine

## 2024-01-01 VITALS — BP 127/82 | HR 94 | Ht 69.0 in | Wt 217.6 lb

## 2024-01-01 DIAGNOSIS — E119 Type 2 diabetes mellitus without complications: Secondary | ICD-10-CM | POA: Diagnosis not present

## 2024-01-01 DIAGNOSIS — E669 Obesity, unspecified: Secondary | ICD-10-CM

## 2024-01-01 DIAGNOSIS — I10 Essential (primary) hypertension: Secondary | ICD-10-CM | POA: Diagnosis not present

## 2024-01-01 DIAGNOSIS — Z125 Encounter for screening for malignant neoplasm of prostate: Secondary | ICD-10-CM | POA: Diagnosis not present

## 2024-01-01 DIAGNOSIS — R110 Nausea: Secondary | ICD-10-CM | POA: Diagnosis not present

## 2024-01-01 DIAGNOSIS — K2270 Barrett's esophagus without dysplasia: Secondary | ICD-10-CM

## 2024-01-01 DIAGNOSIS — E1169 Type 2 diabetes mellitus with other specified complication: Secondary | ICD-10-CM

## 2024-01-01 DIAGNOSIS — E1159 Type 2 diabetes mellitus with other circulatory complications: Secondary | ICD-10-CM

## 2024-01-01 DIAGNOSIS — K219 Gastro-esophageal reflux disease without esophagitis: Secondary | ICD-10-CM

## 2024-01-01 DIAGNOSIS — E785 Hyperlipidemia, unspecified: Secondary | ICD-10-CM | POA: Diagnosis not present

## 2024-01-01 DIAGNOSIS — E1165 Type 2 diabetes mellitus with hyperglycemia: Secondary | ICD-10-CM | POA: Diagnosis not present

## 2024-01-01 MED ORDER — ONDANSETRON HCL 4 MG PO TABS: 4.0000 mg | ORAL_TABLET | Freq: Three times a day (TID) | ORAL | 1 refills | Status: AC | PRN

## 2024-01-01 MED ORDER — ROSUVASTATIN CALCIUM 20 MG PO TABS: 20.0000 mg | ORAL_TABLET | Freq: Every day | ORAL | 3 refills | Status: DC

## 2024-01-01 MED ORDER — METFORMIN HCL ER 500 MG PO TB24
1000.0000 mg | ORAL_TABLET | Freq: Every day | ORAL | 3 refills | Status: AC
Start: 2024-01-01 — End: 2024-12-26

## 2024-01-01 MED ORDER — TRULICITY 4.5 MG/0.5ML ~~LOC~~ SOAJ: 4.5000 mg | SUBCUTANEOUS | 3 refills | Status: AC

## 2024-01-01 MED ORDER — OMEPRAZOLE 40 MG PO CPDR: 40.0000 mg | DELAYED_RELEASE_CAPSULE | Freq: Every day | ORAL | 3 refills | Status: AC

## 2024-01-01 MED ORDER — LISINOPRIL 10 MG PO TABS: 10.0000 mg | ORAL_TABLET | Freq: Every day | ORAL | 3 refills | Status: AC

## 2024-01-01 NOTE — Assessment & Plan Note (Signed)
 A1c 6.7 on labs from January.  [name] currently prescribed metformin  XR 1000 mg daily and Trulicity  4.5 mg weekly.  Repeat A1c and urine microalbumin/creatinine ratio ordered today.

## 2024-01-01 NOTE — Assessment & Plan Note (Signed)
 Symptoms remain well-controlled with omeprazole  40 mg daily.  Refill provided today.

## 2024-01-01 NOTE — Assessment & Plan Note (Signed)
 Lipid panel last updated in July 2024.  Total cholesterol 177 and LDL 114.  Atorvastatin  was discontinued at his last appointment in favor of rosuvastatin  20 mg daily due to concern for adverse side effects.  He has tolerated rosuvastatin  well.  Repeat lipid panel ordered today.

## 2024-01-01 NOTE — Assessment & Plan Note (Signed)
 Remains adequately controlled with lisinopril  10 mg daily.  No medication changes indicated today.

## 2024-01-01 NOTE — Progress Notes (Signed)
 Established Patient Office Visit  Subjective   Patient ID: Douglas Dorsey, male    DOB: 08/18/1975  Age: 48 y.o. MRN: [id number]  Chief Complaint  Patient presents with   Care Management    Three month follow up    Douglas Dorsey returns to care today for routine follow-up.  He was last evaluated by me 2/21 for video encounter after testing positive for COVID-19.  Previously seen by me for routine follow-up on 1/23.  Zofran  was added for as needed nausea relief while on Trulicity .  Atorvastatin  was discontinued in favor of rosuvastatin  due to concern for adverse side effects. 3 month follow up was arranged for reassessment. There have otherwise been no acute interval events.  Today he reports feeling well and has no acute concerns to discuss.  Past Medical History:  Diagnosis Date   Barrett's esophagus 2016   Colon polyp 2015   Complication of anesthesia    pt sts "they told me it took longer for them to wake me up than it should have".   Diabetes mellitus without complication (HCC)    Family history of coronary artery disease 12/24/2011   GERD (gastroesophageal reflux disease)    Hypertension    Tobacco abuse    Past Surgical History:  Procedure Laterality Date   BIOPSY  10/01/2021   Procedure: BIOPSY;  Surgeon: Vinetta Greening, DO;  Location: [facility];  Service: Endoscopy;;   COLONOSCOPY N/A 01/20/2014   RMR: 2 small colonic polyps removed. Friable anal canal hemorrhoids.  Pathology with tubular adenoma and polypoid fragment of benign colonic mucosa.   COLONOSCOPY N/A 02/22/2015   Surgeon: Suzette Espy, MD; normal exam.  Repeat in 10 years.   ESOPHAGOGASTRODUODENOSCOPY N/A 01/20/2014   WUJ:[id number] EG junction abnoraml distal esophagus- query  short segment Barretts -status post esophageal biospy. Hiatal hernia. Abnormal gastric mucosa-status post gastric biopsy.  Pathology with chronic gastritis, negative for H. pylori, Barrett's esophagus without dysplasia.    ESOPHAGOGASTRODUODENOSCOPY N/A 02/22/2015   Surgeon: Suzette Espy, MD; Barrett's esophagus without dysplasia, hiatal hernia.  Repeat in 3 years.   ESOPHAGOGASTRODUODENOSCOPY (EGD) WITH PROPOFOL  N/A 10/01/2021   Procedure: ESOPHAGOGASTRODUODENOSCOPY (EGD) WITH PROPOFOL ;  Surgeon: Vinetta Greening, DO;  Location: [facility];  Service: Endoscopy;  Laterality: N/A;  2:00pm   HERNIA REPAIR     Umbilical   INCISIONAL HERNIA REPAIR N/A 12/17/2013   Procedure: LAPAROSCOPIC INCISIONAL HERNIA REPAIR WITH MESH;  Surgeon: Beau Bound, MD;  Location: [facility];  Service: General;  Laterality: N/A;   INSERTION OF MESH N/A 12/17/2013   Procedure: INSERTION OF MESH;  Surgeon: Beau Bound, MD;  Location: [facility];  Service: General;  Laterality: N/A;   MULTIPLE EXTRACTIONS WITH ALVEOLOPLASTY Bilateral 12/20/2016   Procedure: MULTIPLE EXTRACTION OF TEETH NUMBER ONE, THREE, SIX, SEVEN, EIGHT, NINE, TEN, ELEVEN, FOURTEEN, NINETEEN, TWENTY, TWENTY-ONE, TWENTY-TWO, TWENTY-THREE, TWENTY-FOUR, TWENTY- FIVE, TWENTY-SIX, TWENTY-SEVEN, TWENTY-EIGHT, TWENTY-NINE, THIRTY  ;   ALVEOLOPLASTY; BIOPSY LESION OF UVULA;  Surgeon: Ascencion Lava, DDS;  Location: [facility];  Service: Oral Surgery;  Laterality: Bilateral;   MULTIPLE TOOTH EXTRACTIONS     teeth extracted     12 teeth removed in [place]   Social History   Tobacco Use   Smoking status: Every Day    Current packs/day: 2.00    Average packs/day: 2.0 packs/day for 30.0 years (60.0 ttl pk-yrs)    Types: Cigarettes   Smokeless tobacco: Never  Vaping Use   Vaping status: Some Days  Last attempt to quit: 08/24/2017  Substance Use Topics   Alcohol use: No   Drug use: No   Family History  Problem Relation Age of Onset   Diabetes Mother    Hypertension Mother    Heart disease Mother    Lung disease Mother    Heart failure Father    Heart attack Father    Heart disease Sister    Mental illness Sister    Seizures Sister    Heart attack Brother     Hypertension Brother    Heart disease Brother    Heart disease Brother    Mental illness Brother    Heart attack Maternal Grandmother    Diabetes Maternal Grandmother    Pneumonia Maternal Grandfather    COPD Maternal Grandfather    Emphysema Maternal Grandfather    Lung disease Maternal Grandfather    Colon cancer Neg Hx    Inflammatory bowel disease Neg Hx    Cancer - Colon Neg Hx    No Known Allergies  Review of Systems  Constitutional:  Negative for chills and fever.  HENT:  Negative for sore throat.   Respiratory:  Negative for cough and shortness of breath.   Cardiovascular:  Negative for chest pain, palpitations and leg swelling.  Gastrointestinal:  Negative for abdominal pain, blood in stool, constipation, diarrhea, nausea and vomiting.  Genitourinary:  Negative for dysuria and hematuria.  Musculoskeletal:  Negative for myalgias.  Skin:  Negative for itching and rash.  Neurological:  Negative for dizziness and headaches.  Psychiatric/Behavioral:  Negative for depression and suicidal ideas.       Objective:     BP 127/82   Pulse 94   Ht 5\' 9"  (1.753 m)   Wt 217 lb 9.6 oz (98.7 kg)   SpO2 95%   BMI 32.13 kg/m  BP Readings from Last 3 Encounters:  01/01/24 127/82  08/28/23 136/89  05/27/23 138/82   Physical Exam Vitals reviewed.  Constitutional:      General: [name] not in acute distress.    Appearance: Normal appearance. [name] obese. [name] not ill-appearing.  HENT:     Head: Normocephalic and atraumatic.     Right Ear: External ear normal.     Left Ear: External ear normal.     Nose: Nose normal. No congestion or rhinorrhea.     Mouth/Throat:     Mouth: Mucous membranes are moist.     Pharynx: Oropharynx is clear.  Eyes:     General: No scleral icterus.    Extraocular Movements: Extraocular movements intact.     Conjunctiva/sclera: Conjunctivae normal.     Pupils: Pupils are equal, round, and reactive to light.  Cardiovascular:     Rate and Rhythm:  Normal rate and regular rhythm.     Pulses: Normal pulses.     Heart sounds: Normal heart sounds. No murmur heard. Pulmonary:     Effort: Pulmonary effort is normal.     Breath sounds: Normal breath sounds. No wheezing, rhonchi or rales.  Abdominal:     General: Abdomen is flat. Bowel sounds are normal. There is no distension.     Palpations: Abdomen is soft.     Tenderness: There is no abdominal tenderness.  Musculoskeletal:        General: No swelling or deformity. Normal range of motion.     Cervical back: Normal range of motion.  Skin:    General: Skin is warm and dry.     Capillary  Refill: Capillary refill takes less than 2 seconds.  Neurological:     General: No focal deficit present.     Mental Status: [name] alert and oriented to person, place, and time.     Motor: No weakness.  Psychiatric:        Mood and Affect: Mood normal.        Behavior: Behavior normal.        Thought Content: Thought content normal.   Last CBC Lab Results  Component Value Date   WBC 8.7 11/29/2022   HGB 17.0 11/29/2022   HCT 51.8 (H) 11/29/2022   MCV 86 11/29/2022   MCH 28.1 11/29/2022   RDW 13.2 11/29/2022   PLT 239 11/29/2022   Last metabolic panel Lab Results  Component Value Date   GLUCOSE 233 (H) 02/24/2023   NA 136 02/24/2023   K 5.2 02/24/2023   CL 95 (L) 02/24/2023   CO2 27 02/24/2023   BUN 11 02/24/2023   CREATININE 0.87 02/24/2023   EGFR 109 02/24/2023   CALCIUM  9.9 02/24/2023   PROT 7.3 01/27/2023   ALBUMIN 4.2 01/27/2023   LABGLOB 3.1 01/27/2023   AGRATIO 1.5 12/27/2022   BILITOT 0.3 01/27/2023   ALKPHOS 122 (H) 01/27/2023   AST 16 01/27/2023   ALT 16 01/27/2023   ANIONGAP 7 09/27/2021   Last lipids Lab Results  Component Value Date   CHOL 177 02/24/2023   HDL 36 (L) 02/24/2023   LDLCALC 114 (H) 02/24/2023   TRIG 151 (H) 02/24/2023   CHOLHDL 4.9 02/24/2023   Last hemoglobin A1c Lab Results  Component Value Date   HGBA1C 6.7 (H) 08/28/2023   Last  thyroid functions Lab Results  Component Value Date   TSH 1.550 11/29/2022   Last vitamin D  Lab Results  Component Value Date   VD25OH 21.4 (L) 08/28/2023   Last vitamin B12 and Folate Lab Results  Component Value Date   VITAMINB12 623 11/29/2022   FOLATE 3.8 11/29/2022   The 10-year ASCVD risk score (Arnett DK, et al., 2019) is: 14.2%    Assessment & Plan:   Problem List Items Addressed This Visit       Essential hypertension - Primary   Remains adequately controlled with lisinopril  10 mg daily.  No medication changes indicated today.      Gastroesophageal reflux disease   Symptoms remain well-controlled with omeprazole  40 mg daily.  Refill provided today.      Type 2 diabetes mellitus without complications (HCC)   A1c 6.7 on labs from January.  [name] currently prescribed metformin  XR 1000 mg daily and Trulicity  4.5 mg weekly.  Repeat A1c and urine microalbumin/creatinine ratio ordered today.      Hyperlipidemia associated with type 2 diabetes mellitus (HCC)   Lipid panel last updated in July 2024.  Total cholesterol 177 and LDL 114.  Atorvastatin  was discontinued at his last appointment in favor of rosuvastatin  20 mg daily due to concern for adverse side effects.  He has tolerated rosuvastatin  well.  Repeat lipid panel ordered today.      Return in about 6 months (around 07/03/2024).   Tobi Fortes, MD

## 2024-01-01 NOTE — Patient Instructions (Signed)
 It was a pleasure to see you today.  Thank you for giving [place] the opportunity to be involved in your care.  Below is a brief recap of your visit and next steps.  We will plan to see you again in 6 months.  Summary No medication changes today Refills provided Repeat labs ordered Follow up in 6 months

## 2024-01-02 ENCOUNTER — Ambulatory Visit: Payer: Self-pay | Admitting: Internal Medicine

## 2024-01-02 ENCOUNTER — Other Ambulatory Visit: Payer: Self-pay | Admitting: Internal Medicine

## 2024-01-02 DIAGNOSIS — E1169 Type 2 diabetes mellitus with other specified complication: Secondary | ICD-10-CM

## 2024-01-02 MED ORDER — ROSUVASTATIN CALCIUM 40 MG PO TABS: 40.0000 mg | ORAL_TABLET | Freq: Every day | ORAL | 3 refills | Status: AC

## 2024-01-03 LAB — PSA: Prostate Specific Ag, Serum: 0.9 ng/mL (ref 0.0–4.0)

## 2024-01-03 LAB — CMP14+EGFR
ALT: 12 IU/L (ref 0–44)
AST: 14 IU/L (ref 0–40)
Albumin: 4.1 g/dL (ref 4.1–5.1)
Alkaline Phosphatase: 107 IU/L (ref 44–121)
BUN/Creatinine Ratio: 18 (ref 9–20)
BUN: 15 mg/dL (ref 6–24)
Bilirubin Total: <0.2 mg/dL (ref 0.0–1.2)
CO2: 24 mmol/L (ref 20–29)
Calcium: 10 mg/dL (ref 8.7–10.2)
Chloride: 99 mmol/L (ref 96–106)
Creatinine, Ser: 0.85 mg/dL (ref 0.76–1.27)
Globulin, Total: 2.8 g/dL (ref 1.5–4.5)
Glucose: 103 mg/dL — ABNORMAL HIGH (ref 70–99)
Potassium: 4.9 mmol/L (ref 3.5–5.2)
Sodium: 138 mmol/L (ref 134–144)
Total Protein: 6.9 g/dL (ref 6.0–8.5)
eGFR: 109 mL/min/{1.73_m2} (ref >59)

## 2024-01-03 LAB — CBC WITH DIFFERENTIAL/PLATELET
Basophils Absolute: 0.1 10*3/uL (ref 0.0–0.2)
Basos: 1 %
EOS (ABSOLUTE): 0.2 10*3/uL (ref 0.0–0.4)
Eos: 2 %
Hematocrit: 48.2 % (ref 37.5–51.0)
Hemoglobin: 16.1 g/dL (ref 13.0–17.7)
Immature Grans (Abs): 0 10*3/uL (ref 0.0–0.1)
Immature Granulocytes: 0 %
Lymphocytes Absolute: 2.4 10*3/uL (ref 0.7–3.1)
Lymphs: 20 %
MCH: 29.7 pg (ref 26.6–33.0)
MCHC: 33.4 g/dL (ref 31.5–35.7)
MCV: 89 fL (ref 79–97)
Monocytes Absolute: 0.4 10*3/uL (ref 0.1–0.9)
Monocytes: 4 %
Neutrophils Absolute: 8.5 10*3/uL — ABNORMAL HIGH (ref 1.4–7.0)
Neutrophils: 73 %
Platelets: 237 10*3/uL (ref 150–450)
RBC: 5.42 x10E6/uL (ref 4.14–5.80)
RDW: 14 % (ref 11.6–15.4)
WBC: 11.6 10*3/uL — ABNORMAL HIGH (ref 3.4–10.8)

## 2024-01-03 LAB — MICROALBUMIN / CREATININE URINE RATIO
Creatinine, Urine: 109 mg/dL
Microalb/Creat Ratio: 6 mg/g{creat} (ref 0–29)
Microalbumin, Urine: 6.3 ug/mL

## 2024-01-03 LAB — LIPID PANEL
Chol/HDL Ratio: 5 ratio (ref 0.0–5.0)
Cholesterol, Total: 211 mg/dL — ABNORMAL HIGH (ref 100–199)
HDL: 42 mg/dL (ref >39)
LDL Chol Calc (NIH): 149 mg/dL — ABNORMAL HIGH (ref 0–99)
Triglycerides: 112 mg/dL (ref 0–149)
VLDL Cholesterol Cal: 20 mg/dL (ref 5–40)

## 2024-01-03 LAB — B12 AND FOLATE PANEL
Folate: 3.8 ng/mL (ref >3.0)
Vitamin B-12: 455 pg/mL (ref 232–1245)

## 2024-01-03 LAB — TSH+FREE T4
Free T4: 1.17 ng/dL (ref 0.82–1.77)
TSH: 1.11 u[IU]/mL (ref 0.450–4.500)

## 2024-01-03 LAB — VITAMIN D 25 HYDROXY (VIT D DEFICIENCY, FRACTURES): Vit D, 25-Hydroxy: 27.2 ng/mL — ABNORMAL LOW (ref 30.0–100.0)

## 2024-01-03 LAB — HEMOGLOBIN A1C
Est. average glucose Bld gHb Est-mCnc: 134 mg/dL
Hgb A1c MFr Bld: 6.3 % — ABNORMAL HIGH (ref 4.8–5.6)

## 2024-07-05 ENCOUNTER — Ambulatory Visit

## 2024-08-20 ENCOUNTER — Encounter: Payer: Self-pay | Admitting: Pharmacy Technician

## 2024-08-20 ENCOUNTER — Other Ambulatory Visit: Payer: Self-pay

## 2024-08-20 ENCOUNTER — Telehealth: Payer: Self-pay | Admitting: Pharmacy Technician

## 2024-08-20 ENCOUNTER — Other Ambulatory Visit (HOSPITAL_COMMUNITY): Payer: Self-pay

## 2024-08-20 DIAGNOSIS — E119 Type 2 diabetes mellitus without complications: Secondary | ICD-10-CM

## 2024-08-20 NOTE — Telephone Encounter (Signed)
 Okay to proceed with PA for Trulicity .  Pt needs f/u appt soon.

## 2024-08-20 NOTE — Telephone Encounter (Signed)
 Pharmacy Patient Advocate Encounter   Received notification from [employer] CMM KEY that prior authorization for Trulicity  4.5mg /0.61ml auto-injectors is required/requested.   Insurance verification completed.   The patient is insured through [facility].   Patient was last seen by Dr. Melvenia on 01/01/2024. This appears to be new insurance for him. He has not filled his Trulicity  since 03/21/2024. He has been filling his metformin . Is it okay to go ahead and submit the PA for the Trulicity  4.5mg  dose?   CMM Key# [id number]

## 2024-08-20 NOTE — Telephone Encounter (Signed)
 error

## 2024-08-23 ENCOUNTER — Other Ambulatory Visit (HOSPITAL_COMMUNITY): Payer: Self-pay

## 2024-08-23 NOTE — Telephone Encounter (Signed)
 Pharmacy Patient Advocate Encounter   PA required; PA submitted to above mentioned insurance via Latent Key/confirmation #/EOC [id number] Status is pending

## 2024-08-23 NOTE — Telephone Encounter (Signed)
 Pharmacy Patient Advocate Encounter  Received notification from [facility] that Prior Authorization for Trulicity  4.5mg  has been APPROVED from 08/23/24 to 08/23/25. Ran test claim, Copay is $25. This test claim was processed through [facility]- copay amounts may vary at other pharmacies due to pharmacy/plan contracts, or as the patient moves through the different stages of their insurance plan.   PA #/Case ID/Reference #: [id number]

## 2024-09-10 ENCOUNTER — Encounter (INDEPENDENT_AMBULATORY_CARE_PROVIDER_SITE_OTHER): Payer: Self-pay | Admitting: *Deleted
